# Patient Record
Sex: Female | Born: 1945
Health system: Southern US, Community
[De-identification: ages and names within clinical notes are randomized; demographics above are authoritative.]

## PROBLEM LIST (undated history)

## (undated) DIAGNOSIS — G2581 Restless legs syndrome: Secondary | ICD-10-CM

## (undated) DIAGNOSIS — C50919 Malignant neoplasm of unspecified site of unspecified female breast: Secondary | ICD-10-CM

## (undated) DIAGNOSIS — I4891 Unspecified atrial fibrillation: Secondary | ICD-10-CM

## (undated) DIAGNOSIS — G4730 Sleep apnea, unspecified: Secondary | ICD-10-CM

## (undated) DIAGNOSIS — M5440 Lumbago with sciatica, unspecified side: Secondary | ICD-10-CM

## (undated) DIAGNOSIS — I499 Cardiac arrhythmia, unspecified: Secondary | ICD-10-CM

## (undated) DIAGNOSIS — E113593 Type 2 diabetes mellitus with proliferative diabetic retinopathy without macular edema, bilateral: Secondary | ICD-10-CM

## (undated) DIAGNOSIS — K219 Gastro-esophageal reflux disease without esophagitis: Secondary | ICD-10-CM

## (undated) DIAGNOSIS — G510 Bell's palsy: Secondary | ICD-10-CM

## (undated) DIAGNOSIS — M1990 Unspecified osteoarthritis, unspecified site: Secondary | ICD-10-CM

## (undated) DIAGNOSIS — S82842A Displaced bimalleolar fracture of left lower leg, initial encounter for closed fracture: Secondary | ICD-10-CM

## (undated) DIAGNOSIS — E119 Type 2 diabetes mellitus without complications: Secondary | ICD-10-CM

## (undated) HISTORY — PX: VAGINAL HYSTERECTOMY: SUR661

## (undated) HISTORY — DX: Restless legs syndrome: G25.81

## (undated) HISTORY — DX: Type 2 diabetes mellitus without complications: E11.9

## (undated) HISTORY — DX: Malignant neoplasm of unspecified site of unspecified female breast: C50.919

## (undated) HISTORY — DX: Type 2 diabetes mellitus with proliferative diabetic retinopathy without macular edema, bilateral: E11.3593

## (undated) HISTORY — DX: Unspecified osteoarthritis, unspecified site: M19.90

## (undated) HISTORY — PX: OTHER SURGICAL HISTORY: SHX169

## (undated) HISTORY — DX: Lumbago with sciatica, unspecified side: M54.40

## (undated) HISTORY — PX: JOINT REPLACEMENT: SHX530

## (undated) HISTORY — DX: Bell's palsy: G51.0

---

## 2004-12-10 HISTORY — PX: OTHER SURGICAL HISTORY: SHX169

## 2005-10-25 ENCOUNTER — Inpatient Hospital Stay (HOSPITAL_COMMUNITY): Admission: RE | Admit: 2005-10-25 | Discharge: 2005-10-30 | Payer: Self-pay | Admitting: Orthopedic Surgery

## 2005-10-25 ENCOUNTER — Ambulatory Visit: Payer: Self-pay | Admitting: Physical Medicine & Rehabilitation

## 2007-01-02 ENCOUNTER — Inpatient Hospital Stay (HOSPITAL_COMMUNITY): Admission: RE | Admit: 2007-01-02 | Discharge: 2007-01-05 | Payer: Self-pay | Admitting: Orthopedic Surgery

## 2007-07-15 ENCOUNTER — Inpatient Hospital Stay (HOSPITAL_COMMUNITY): Admission: RE | Admit: 2007-07-15 | Discharge: 2007-07-17 | Payer: Self-pay | Admitting: Orthopedic Surgery

## 2007-10-21 ENCOUNTER — Inpatient Hospital Stay (HOSPITAL_COMMUNITY): Admission: RE | Admit: 2007-10-21 | Discharge: 2007-10-24 | Payer: Self-pay | Admitting: Orthopedic Surgery

## 2007-12-11 HISTORY — PX: TOTAL HIP ARTHROPLASTY: SHX124

## 2009-12-10 HISTORY — PX: OTHER SURGICAL HISTORY: SHX169

## 2010-12-10 DIAGNOSIS — G2581 Restless legs syndrome: Secondary | ICD-10-CM

## 2010-12-10 HISTORY — DX: Restless legs syndrome: G25.81

## 2011-04-24 NOTE — H&P (Signed)
Heather [name], Heather [name]               ACCOUNT NO.:  [id number]   MEDICAL RECORD NO.:  [id number]        PATIENT TYPE:  LINP   LOCATION:                               FACILITY:  [facility]   PHYSICIAN:  Pietro Cassis. Alvan Dame, M.D.  DATE OF BIRTH:  12-Jun-1946   DATE OF ADMISSION:  10/21/2007  DATE OF DISCHARGE:                              HISTORY & PHYSICAL   PROCEDURE:  Will be conversion to left total hip arthroplasty.   CHIEF COMPLAINT:  Left hip pain.   HISTORY OF PRESENT ILLNESS:  A 65 year old female with a history of left  hip pain with a history of left hip fracture with percutaneous screw  fixation with nonunion and subsequent pain.  It has been refractory to  all conservative treatment and therapy.  She has been presurgically  assessed by Dr. __________  .   PAST MEDICAL HISTORY:  Significant for:   1. Left femoral neck fracture with percutaneous screw fixation.  2. Osteoarthritis.  3. Diabetes.  4. Hyperlipidemia.  5. Overactive bladder.  6. Rhinitis.   PAST SURGICAL HISTORY:  Significant for percutaneous screw fixation left  hip to left knee surgery in January 2008 and 1953, right knee surgery  2006, cholecystectomy 1990, hysterectomy 1984.   FAMILY HISTORY:  Diabetes, cancer.  ALS multiple family members.   SOCIAL HISTORY:  The patient is married.  Primary caregiver after  surgery will be husband.   DRUG ALLERGIES:  SULFA, COUMADIN which causes significant blood thinning  with usage.   MEDICATIONS:  1. Actos plus metformin 15/500 two q.a.m., one q.p.m.  2. Vytorin 10/41 p.o. daily.  3. Zoloft 150 mg one p.o. daily.  4. Detrol LA 4 mg one p.o. daily.  5. Requip 2 mg one p.o. daily.  6. Neurontin 600 mg one p.o. daily.  7. Calcium plus vitamin D.  8. Multivitamin.  9. Aspirin 81 mg.   REVIEW OF SYSTEMS:  NEUROLOGY:  Has a history of some restless leg  syndrome.  GENITOURINARY:  Recent UTI treated.  Otherwise, see HPI.   PHYSICAL EXAMINATION:  VITAL SIGNS:  Pulse  72, respirations 18, blood  pressure 128/60.  GENERAL:  Awake, alert, and oriented, well-developed, well-nourished in  no acute distress.  NECK:  Supple.  No carotid bruits.  CHEST/LUNGS:  Clear to auscultation bilaterally.  BREASTS:  Deferred.  HEART:  Regular rate and rhythm without gallops, clicks, rubs or  murmurs.  ABDOMEN:  Soft, nontender, nondistended.  Bowel sounds present.  GENITOURINARY:  Deferred.  EXTREMITIES:  Increased pain with flexion, internal rotation of her left  hip.  SKIN:  Previous lateral scar for percutaneous screw fixation.  No  cellulitis.  NEUROLOGIC:  Intact distal sensibilities.   LABORATORY DATA:  Labs and chest x-ray are pending.   EKG was performed at primary care showing normal sinus rhythm.   IMPRESSION:  1. Nonunion left femoral neck fracture with percutaneous screw.  2. Osteoarthritis.  3. Diabetes.  4. Hyperlipidemia.  5. Overactive bladder.  6. Rhinitis.   PLAN OF ACTION:  Conversion of left total hip arthroplasty with removal  of percutaneous screw.  Risks and complications were discussed.   Postoperative medications including Lovenox Robaxin, Colace, MiraLax,  iron, aspirin provided at time of History and Physical.  Postoperative  pain medicine will be provided at time of surgery.     ______________________________  Carlean Jews Collene Mares, [place]      Pietro Cassis. Alvan Dame, M.D.  Electronically Signed    BLM/MEDQ  D:  10/16/2007  T:  10/17/2007  Job:  [id number]

## 2011-04-24 NOTE — Op Note (Signed)
NAMEBERKELEY, CARDIEL               ACCOUNT NO.:  [id number]   MEDICAL RECORD NO.:  [id number]          PATIENT TYPE:  INP   LOCATION:  5013                         FACILITY:  [facility]   PHYSICIAN:  Pietro Cassis. Alvan Dame, M.D.  DATE OF BIRTH:  [birth year]   DATE OF PROCEDURE:  07/15/2007  DATE OF DISCHARGE:                               OPERATIVE REPORT   PREOPERATIVE DIAGNOSIS:  Nondisplaced left femoral neck fracture.   POSTOPERATIVE DIAGNOSIS:  Nondisplaced left femoral neck fracture.   PROCEDURE:  Closed reduction percutaneous cannulated screw fixation of a  left femoral neck fracture.   SURGEON:  Pietro Cassis. Alvan Dame, M.D.   ASSISTANT:  None.   ANESTHESIA:  General.   BLOOD LOSS:  Minimal.   COMPLICATIONS:  None.   INDICATIONS FOR PROCEDURE:  Ms. Coffey is a pleasant 65 year old female  who is known to our practice to Dr. Justice Britain.  She had a history of  bilateral total knee replacements through him.  She had a fall a couple  weeks ago where she was trying to prevent falling onto her knees  directly when she landed on her left hip.  She did have pain but was  initially seen in the local emergency room where radiographs were non-  conclusive for femoral neck fracture.  She had persistent discomfort in  the hip but also a lot of pain down into her knee area.  She was  subsequently seen in Dr. Susie Cassette office for evaluation of knee pain.  Radiographs in our office indicated the femoral neck fracture was  nondisplaced that she had been walking on for 2 weeks.   I was asked to manage this left hip fracture.  I discussed with her the  risks and benefits including a 30% risk of nonunion or failed union  requiring revision to a total hip replacement with this technique.  At  65 years of age I think this offers a good opportunity for joint  preservation.   PROCEDURE IN DETAIL:  The patient was brought into the operative  theater.  Once adequate anesthesia and preoperative antibiotics, 2  grams  of Ancef, administered the patient was positioned supine on the fracture  table.  The right leg was flexed and abducted out of the way with bony  prominences padded particularly on the lateral side.  The left hip was  then placed into the traction shoe.   With gentle traction internal rotation and some flexion of the hip joint  under fluoroscopic imaging the fracture was maintained in this reduction  in both AP and lateral planes.  At this point the lateral side of her  thigh was prepped and draped with shower curtain technique.  At this  point fluoroscopy was brought into the field.  Landmarks were identified  using the pin.   The lateral incision was made.  The initial pin was first inserted into  the inferior portion of the neck into the center portion of the head on  the lateral view.  Two other subsequent guidewires were then passed  anterior and posterior proximal to this.  I then measured  the depths of  the screws, drilled the outer cortex and then passed 7.5 mm cannulated  screws with 16 mm partial threads.  This was done in order from inferior  neck to posterior to anterior.  They were tightened down in order.  There was no displacement ore movement of the fracture site.  Following  this final radiographs were obtained in AP and lateral planes.  The  wound was irrigated.  The wound was reapproximated with 2-0 Vicryl and a  running 4-0 Monocryl.  The wound was then cleaned, dried and dressed  with Steri-Strips and a sterile dressing.  The patient was then brought  to recovery room, extubated in stable condition.      Pietro Cassis Alvan Dame, M.D.  Electronically Signed     MDO/MEDQ  D:  07/15/2007  T:  07/16/2007  Job:  [id number]

## 2011-04-24 NOTE — Op Note (Signed)
Heather [name], COFFEE               ACCOUNT NO.:  [id number]   MEDICAL RECORD NO.:  [id number]          PATIENT TYPE:  INP   LOCATION:  [place]                         FACILITY:  [facility]   PHYSICIAN:  Pietro Cassis. Alvan Dame, M.D.  DATE OF BIRTH:  Feb 04, 1946   DATE OF PROCEDURE:  10/21/2007  DATE OF DISCHARGE:                               OPERATIVE REPORT   PREOPERATIVE DIAGNOSIS:  Failed left hip surgery from previous femoral  neck fracture repair with malunion.   POSTOPERATIVE DIAGNOSIS:  Failed left hip surgery from previous femoral  neck fracture repair with malunion.   PROCEDURE:  Conversion of previous left hip surgery to a left total hip  replacement utilizing DePuy hip system, size 50 pinnacle cup, a 36 metal  neutral liner, size 4 standard Trilock stem with a 36, +5 Delta ceramic  ball.   SURGEON:  Pietro Cassis. Alvan Dame, M.D.   ASSISTANT:  Carlean Jews. Mann, P.A.-C.   ANESTHESIA:  General.   BLOOD LOSS:  500 mL.   DRAINS:  One.   COMPLICATIONS:  None.   INDICATIONS FOR PROCEDURE:  Ms. Ragonese is a 65 year old female patient  of mine following a femoral neck fracture.  This was treated initially  based on the nondisplaced nature of it with percutaneous screw fixation.  She had never had pain relief despite it now three months out postop,  her pain was persistent and growing with activity.  She was unable to  get away from an assist device which would be atypical in the healing of  a fracture of this nature.  I discussed with her the treatment options  and based on the prolonged nature of how things were going as well as co-  existing knee discomfort above a primary total knee replacement, she  wished to proceed with surgical intervention converting this to a total  hip replacement.  We reviewed the risks and benefits of these procedures  including infection, DVT, component failure, need for revision,  dislocation as well as the postoperative course, expectations, hospital  stay.   Consent was obtained for an attempt of better relief of her left  hip pain.   PROCEDURE IN DETAIL:  The patient was brought to the operative theater.  Once adequate anesthesia and preoperative antibiotics, 2 grams of Ancef,  were administered, the patient was positioned in the right lateral  decubitus position with the bony prominence padded.  The left lower  extremity was then pre-scrubbed and prepped and draped in a sterile  fashion.  The previous incision was identified which was anterior, thus  a new incision was carried out.  I extended it distally and up over the  lateralis to expose the vastus lateralis fascia.  Initial exposure was  taken down to the iliotibial band, the vastus fascia, as well as the  gluteal fascia.  I exposed distally initially and was able to palpate  the screws and locate them and remove them without complication.  Following removal of the hardware, I did reapproximate this iliotibial  band and vastus fascia with a #1 Vicryl.   Following removal of the hardware, I  tended to the femur.  I extended my  incision posterior for a posterior approach.  The short external  rotators were elevated off of the posterior capsule and removed  separately. The posterior capsule was then opened with a posterior  leaflet preserved for later attempt at repair, but please note I was  unable to do a debridement of the superior leaflet with reaming.  Nonetheless, the posterior leaflet was saved for protection of the  sciatic nerve from retractors.   When the hip dislocated, I noted at this point significant shortening of  the femoral head and there was basically no neck segment to cut and so I  made an initial saw cut into the trochanteric fossa which I was able to  palpate which was basically into the inferior portion of the head.  Following removal of the head, I was able to debride portions of the  neck.  There was no evidence of any infection or nonunion material.  I   placed retractors to prepare the femur and used a starting reamer  followed by hand reamer for canal identification, irrigated the canal to  prevent fat emboli.  I then began broaching up using the tip of the  trochanter as my primary landmark.  I initially broached up to a size 3  which sat beneath my neck cut and I used the calcar mill at this point  to set the femoral neck angle. I then broached up to a size 4.  I then  packed the femur and attended to the acetabulum.  Acetabular retractors  were placed, labrectomy carried down following exposure. I did begin  reaming with a 43 reamer and reamed up to a 49 reamer with excellent  bony bed preparation and preserved anatomy.  I impacted a 50 mm cup at  this point at about 35-40 degrees of abduction, 10 degrees of forward  flexion beneath the anterior rim.  A single cancellous screw was placed.  We had an excellent purchase with an excellent initial scratch fit.  The  trial liner was placed at 36 neutral.   Attention was redirected back to the femur. Initially, I did broach back  up to a size 5 around the level of my neck cut.  The femoral head  appeared to sit a bit proud of the tip of the trochanter and  accordingly, I was unable to reduce the hip.  I removed the size 5  broach and broached with a 4 and sat it down within the neck cut  approximately 5 mm, thus requiring the use of a calcar mill.  I milled  the bone back down to the level of the size 4 broach and did a trial  reduction with a standard neck and a 36, +1 ball.  The hip reduced, the  combined anteversion was probably about 50 degrees, the hip was very  stable.  The leg lengths, compared to the down leg, at least was not  over lengthened but may be a little bit short.  At this point, I  dislocated and re-trialed the 36, +5 ball.  At this point, the hip  stability remained good, this only added about 1.5 mm in length and also  1.5 mm offset which I wanted.  With this, I  removed all trial  components, placed the central hole eliminator in the acetabulum and  placed a metal 36 by 50 neutral liner without difficulty or  complication. The final size 4 four standard stem was impacted  at the  level of the new neck cut following broaching and a 36, +5 ceramic ball  impacted onto the trunnion.  The hip was reduced.  Throughout the case,  the hip was irrigated.  She was noted to have significant adipose tissue  with some initial oozing and bleeding but at no time during the case  there was no significant hemostasis that was required.  I did use a  Hemovac drain intra-articular.  As previously noted, the superior  capsular leaflet was not able to be reapproximated.  We reapproximated  the remaining portion of the iliotibial band with #1 Ethibond and ran a  #1 Vicryl in the gluteal fascia.  The remainder of the wound was closed  2-0 Vicryl and running 4-0 Monocryl.  The patient was then transferred  to the hospital bed in stable condition.   I am going to put her in a knee immobilizer to help support the left  knee, have her ice her left knee, as well, as I have a feeling it will  be sore after the surgery.  This was something that she and I reviewed  and may need to address later on.      Pietro Cassis Alvan Dame, M.D.  Electronically Signed     MDO/MEDQ  D:  10/21/2007  T:  10/21/2007  Job:  [id number]

## 2011-04-24 NOTE — H&P (Signed)
NAMESYDNEY, MINOT               ACCOUNT NO.:  [id number]   MEDICAL RECORD NO.:  [id number]           PATIENT TYPE:   LOCATION:                                 FACILITY:   PHYSICIAN:  Tracy A. Shuford, P.A.-C.DATE OF BIRTH:  1946/07/30   DATE OF ADMISSION:  07/15/2007  DATE OF DISCHARGE:                              HISTORY & PHYSICAL   CHIEF COMPLAINT:  Left hip pain.   HISTORY:  Ms. Mester is a 65 year old female who sustained a fall 2  weeks ago.  She stumbled trying to put on shoes in a shoe store and in  order to try to save her knee that she had replaced 6 months, she  twisted awkwardly and fell directly onto her left hip.  She has had  significant hip and groin pain since that time.  Pain with attempt at  weight bearing.  She was initially seen at [facility] where x-  rays were reportedly negative.  She ambulated in to our office today on  a walker and has had continued intractable hip pain.  X-rays in our  office showed a nondisplaced impacted femoral neck fracture.  With these  findings, she is now being admitted tomorrow for planned left hip  pinning by Dr. Alvan Dame.  Risks and benefits discussed with patient.  She is  in agreement and wishes to proceed.   PAST MEDICAL HISTORY:  Significant for:  1. Non-insulin-dependent diabetes.  2. Restless leg syndrome.  3. Hypercholesterolemia.   PAST SURGICAL HISTORY:  1. Previous knee reconstructions.  2. More recent bilateral total knee arthroplasties.   SOCIAL HISTORY:  She does not drink or smoke.   MEDICATIONS:  Requip, Actos, metformin, Vytorin, Zoloft, Neurontin and  aspirin.   DRUG ALLERGIES:  SHE DID NOT TOLERATE DILAUDID IN INTRAVENOUS FORM  PREVIOUSLY, BUT DOES OKAY WITH MORPHINE.   REVIEW OF SYSTEMS:  She denies recent fevers, chills or night sweats.  No bleeding tendencies.  CNS:  No blurred, double vision, seizure or  paralysis.  RESPIRATORY:  No shortness of breath, productive cough or  hemoptysis.   GI:  No nausea, vomiting, constipation, melena or bloody  stools.  GENITOURINARY:  No dysuria or hematuria.  She does have a  history of bladder spasm and has been placed on no medication for this.  MUSCULOSKELETAL:  As per history of present illness.   PHYSICAL EXAMINATION:  GENERAL:  Ms. Rubi is an alert and oriented 78-  year-old female in obvious discomfort in regards to her left hip.  CHEST:  Clear.  HEART:  Regular rate and rhythm.  ABDOMEN:  Positive bowel sounds.  EXTREMITIES:  Shows the left hip with painful, guarded range of motion.  Pain with log roll.  Neurovascularly she is grossly intact.   X-RAYS:  Film of the left hip show an impacted left femoral neck  fracture without significant displacement.   IMPRESSION:  Impacted left femoral neck fracture.   PLAN:  She will be admitted for planned hip pinning.  Based on the x-  rays, Dr. Alvan Dame will review.  If he should decide she would  be more of a  candidate for hemiarthroplasty, we will proceed based on his  recommendations.  We will plan on admission tomorrow for planned hip  surgery.      Tracy A. Shuford, P.A.-C.     TAS/MEDQ  D:  07/14/2007  T:  07/14/2007  Job:  [id number]

## 2011-04-27 NOTE — Op Note (Signed)
NAMEPHEDRA, ZELENKA               ACCOUNT NO.:  [id number]   MEDICAL RECORD NO.:  [id number]          PATIENT TYPE:  INP   LOCATION:  0012                         [facility]ACILITY[facility]   PHYSICIAN:  Metta Clines. Supple, M.D.  DATE OF BIRTH:  Sep 19, 1946   DATE OF PROCEDURE:  10/25/2005  DATE OF DISCHARGE:                                 OPERATIVE REPORT   PREOPERATIVE DIAGNOSIS:  End-stage right knee osteoarthrosis.   POSTOPERATIVE DIAGNOSIS:  End-stage right knee osteoarthrosis.   PROCEDURE:  Right total knee arthroplasty utilizing a cemented DePuy Sigma  implant system with size 3 femur, size 3 tibia, a 10 mm thick rotating  platform polyethylene insert.   SURGEON:  Metta Clines. Supple, M.D.   Terrence DupontOlivia Mackie A. Shuford, P.A.-C.   ANESTHESIA:  General endotracheal.   TOURNIQUET TIME:  One hour and 10 minutes.   ESTIMATED BLOOD LOSS:  200 cc.   DRAINS:  Hemovac x1.   HISTORY:  Ms. Ribar is a 65 year old female who has had chronic and  progressively increasing bilateral knee pain with clinical examination  showing moderate restrictions in mobility and plain radiographs confirming  advanced arthrosis with particular involvement of the patellofemoral joints.  At the time of surgical scheduling, the right knee was most symptomatic, and  she requested to proceed with the right knee.  Patient was brought to the  operating room at this time for planned right total knee arthroplasty.   I preoperatively counseled Ms. Lisanti on treatment options as well as risks  versus benefits thereof, possible surgical complications of bleeding,  infection, neurovascular injury, DVT, PE, persistent pain, loss of motion,  anesthetic complication, and possible need for additional surgery were  reviewed.  She understands and accepts and agrees with our planned  procedure.   PROCEDURE IN DETAIL:  After undergoing routine preoperative evaluation, the  patient received prophylactic antibiotics, placed supine  on the operating  room table, and underwent smooth induction of a general endotracheal  anesthesia.  A Foley catheter was placed.  Patient received antibiotics.  Tourniquet applied to the right thigh, and the right leg was sterilely  prepped and draped in the standard fashion.  The leg was exsanguinated with  the tourniquet inflated initially to 350 mmHg and then increased to 400 mmHg  10 minutes into the case.  An anterior midline incision was then made from  four fingerbreadths above the patella to just medial to the tibial tubercle.  Total length was approximately 20 cm.  Skin flaps were elevated medially and  laterally, and the medial parapatellar arthrotomy was performed with  electrocautery.  Hemostasis was obtained with electrocautery.  The patella  was everted and noted to have very large peripheral osteophytes with a very  large loose fragment in the quadriceps tendon distally.  These were all  carefully excised.  This allowed complete eversion of the patella without  difficulty.  The knee was flexed up, and then the remnants of the menisci as  well as the cruciate ligaments were then excised.  The dura was used to gain  access to the femoral and medullary  canal followed by an intramedullary  guide.  An 11 cut was made from the distal femur at a 5 degree valgus  alignment.  The distal femur was then sized, and the size 3 had the proper  fit.  The size 3 cutting guide was then applied.  We made the anterior,  posterior, and chamfer cuts on the distal femur.  Attention was then turned  to the proximal tibia, which was reflected with McHale retractors.  Using  the extramedullary guide, removed 10 mm of bone, measured from the lateral  plateau.  The proximal tibia was then measured as a size 3.  The size 3  trial was then pinned into place.  We performed a trial reduction with the  femoral tibial implants, and this showed good soft tissue balance and  alignment.  The tibia was then  terminally repaired with the intramedullary  reamer followed by a broach.  Attention was then returned to the distal  femur, where we used the box cutting guide to cut the box along the distal  femur.  We then replaced the trial implants and again confirmed good soft  tissue balance, excellent stability, and good motion.  Attention was then  turned to the patella, where we used an off-loading saw to removed  approximately 9 mm of bone.  Stabilized drill pegs were then placed, and we  confirmed that the 38 mm button had a proper fit.  At this point, all  implants were removed.  The joint was pulsatile lavaged and meticulously  cleaned.  We palpated the posterior compartments and found no evidence for  retaining bone spurs, loose bodies, or debris.  Cement was then mixed at the  appropriate consistency.  The implants were then cemented into position,  beginning with the femur, the tibia, and the patella.  Meticulously removed  all extra cement.  Trial reduction was then completed and showed to be soft  tissue balance.  We used a 10 mm thick rotating platform, posterior  stabilized tibial tray.  Range of motion showed good stability.  At this  point, the tourniquet was then let down.  Hemostasis was obtained.  A  Hemovac drain was then brought out laterally.  The parapatellar arthrotomy  was closed with a series of figure-of-eight #1 Vicryl sutures, and 2-0  Vicryl was used for the subcu and intracuticular 3-0 Monocryl for the skin,  followed by Steri-Strips.  A dry dressing was applied.  The leg was placed  into a knee immobilizer.  The patient was then extubated and taken to the  recovery room in stable condition.      Metta Clines. Supple, M.D.  Electronically Signed     KMS/MEDQ  D:  10/25/2005  T:  10/25/2005  Job:  [id number]

## 2011-04-27 NOTE — Discharge Summary (Signed)
NAMEDENIZE, HANKE               ACCOUNT NO.:  [id number]   MEDICAL RECORD NO.:  [id number]          PATIENT TYPE:  INP   LOCATION:  5013                         FACILITY:  [facility]   PHYSICIAN:  Pietro Cassis. Alvan Dame, M.D.  DATE OF BIRTH:  Jul 19, 1946   DATE OF ADMISSION:  07/15/2007  DATE OF DISCHARGE:  07/17/2007                               DISCHARGE SUMMARY   ADMITTING DIAGNOSES:  1. Left femoral neck fracture.  2. Non-insulin-dependent diabetes.  3. Restless legs syndrome.  4. Hypercholesterolemia.  5. Bilateral total knee arthroplasties.   DISCHARGE DIAGNOSES:  1. Left femoral neck fracture status post open reduction, internal      fixation.  2. Non-insulin-dependent diabetes.  3. Restless legs syndrome.  4. Hypercholesterolemia.  5. Bilateral total knee arthroplasties.   CONSULTS:  None.   PROCEDURE:  Open reduction and percutaneous screw fixation, left hip.   SURGEON:  Pietro Cassis. Alvan Dame, M.D.   BRIEF HISTORY OF PRESENT ILLNESS:  A 65 year old female who fell about 2  weeks ago, was previously evaluated, seen in our office after pain had  not subsided.  X-rays revealed an impacted femoral neck fracture.  Admitted for plan of the left hip pinning by Dr. Alvan Dame.   LABORATORIES:  Admission CBC showed hematocrit to be 32.1 and 32.8  postoperatively.  Routine chemistry all within normal limits at  discharge.  Kidney function:  Her GFR was 5049 at the time of discharge.  Her calcium was 8.9.   EKG:  Normal sinus rhythm.   RADIOLOGY:  Intraoperative left hip showed pinning of the left femoral  neck.   HOSPITAL COURSE:  The patient was admitted to the hospital on the 5th  after impacted femoral neck fracture found on x-ray.  Surgery took place  on the 5th.  She did well.  During the course of her stay, she remained  afebrile.  She remained neurovascularly intact of her left lower  extremity.  She did have a UTI, which was treated with Cipro 500 mg  while in the hospital.  She  was partially weightbearing at 50%.  Hep-  Lock was done on day 1.  Progressed nicely about postoperative day #2,  was having some leg pain but consistent with surgery.  Temperature was  101.  Dessing was dry.  She was ready for discharge home.  She already  had Percocet and Robaxin at home.  Cipro was added for an additional 3  days for her UTI.   DISCHARGE DISPOSITION:  Discharge home in stable and improved condition.   DISCHARGE WOUND CARE:  Change dressing on a daily basis.   DISCHARGE ACTIVITY:  Partial weightbearing 25-50%, left lower extremity.   DISCHARGE FOLLOWUP:  Follow up with Dr. Aurea Graff office, ward 3900, in 10  days.   DISCHARGE MEDICATIONS:  Include:  1. Percocet.  2. Robaxin prn for spasm and pain.  3. Aspirin 325 mg p.o. daily for 4 weeks.  4. Requip 2 mg p.o. daily.  5. Actos.  6. Metformin.  7. Vytorin.  8. Zoloft.  9. Neurontin.  10.Detrol.  Continue.   DISCHARGE HOME HEALTH:  [facility] with physical therapy.   DISCHARGE PHYSICAL THERAPY:  Goals of physical therapy:  To work on gait  training, proprioception, minimize pain, maximize strength, increase  range of motion, encourage independence in activities of daily living.     ______________________________  Carlean Jews. Collene Mares, [place]      Pietro Cassis. Alvan Dame, M.D.  Electronically Signed    BLM/MEDQ  D:  08/20/2007  T:  08/20/2007  Job:  [id number]

## 2011-04-27 NOTE — Discharge Summary (Signed)
Heather [name], Heather [name]               ACCOUNT NO.:  [id number]   MEDICAL RECORD NO.:  [id number]          PATIENT TYPE:  INP   LOCATION:  [place]                         FACILITY:  [facility]   PHYSICIAN:  Pietro Cassis. Alvan Dame, M.D.  DATE OF BIRTH:  [birth year]   DATE OF ADMISSION:  10/21/2007  DATE OF DISCHARGE:  10/24/2007                               DISCHARGE SUMMARY   ADMITTING DIAGNOSES:  1. Nonunion left hip fracture.  2. Osteoarthritis.  3. Diabetes.  4. Hyperlipidemia.  5. Over-active bladder.  6. Rhinitis.   DISCHARGE DIAGNOSES:  1. Nonunion left neck fracture.  2. Osteoarthritis.  3. Diabetes.  4. Hyperlipidemia.  5. Over-active bladder.  6. Rhinitis.  7. Acute blood loss anemia with transfusion.   HISTORY OF PRESENT ILLNESS:  A 65 year old female with a history of left  hip pain with a history of left hip fracture with percutaneous screw  fixation with nonunion and subsequent pain.  Refractory to all  conservative treatment therapy.  Presurgically assessed by Dr. Greig Right who is at [place].   CONSULTS:  None.   PROCEDURE:  Conversion left total hip replacement.  Surgeon Dr. Paralee Cancel.  Assistant Rowan Blase, PA-C.  Components were metal on ceramic.   LABS:  Preadmission CBC:  Hematocrit 31.5, postoperatively on day 2, it  dropped down to 21.3.  Given 2 units and came back up to 25.2 and was  stable.  Her platelets were 139,000 at discharge as well.  Routine  chemistry on admission:  Creatinine was 1.22, glucose 150, all others  normal, tracked throughout her course of stay.  At discharge, her  glucose was 102, her creatinine was back down to 1.02.  GFR measurement  was 45 preadmission, 55 at time of discharge, with her calcium 7.9.  UA  showed small bilirubin and a trace of ketones, otherwise negative.  She  was transfused 2 units of B-positive packed red blood cells.   RADIOLOGY:  Portable pelvis:  No complication status post left total hip  place.   No chest x-ray seen in chart.  No EKG found in chart either.   HOSPITAL COURSE:  The patient underwent conversion left total hip  replacement.  Tolerated procedure well.  Was admitted to the orthopedic  floor.  She did have a mild fever on postop day #1 but it resolved by  postop day #2.  She did have a drop in her hematocrit down to 25.3  ultimately requiring 2 units of packed red blood cells.  Dressing was  changed on a daily basis.  After postoperative day #1, no significant  drainage.  She was neurovascularly intact of her left lower extremity  throughout her course of stay.  She was PT/OT weightbearing as tolerated  throughout.  Did well with her progress.  After the 2 units of blood,  she felt much better and much stronger.  She was able to ambulate at  least 50 feet with the use of rolling walker and, after day three and  physical therapy, she was ready for discharge home.   DISCHARGE DISPOSITION:  Discharge home with home health care PT, stable  and improved condition.   DISCHARGE DIET:  Regular.   DISCHARGE WOUND CARE:  Keep dry.   DISCHARGE PHYSICAL THERAPY:  She is weightbearing as tolerated with the  use of her own walker.  Goals of physical therapy to be to minimize  pain, maximize strength, increase range of motion, gait retraining.   DISCHARGE MEDICATIONS INCLUDE:  1. Lovenox 40 mg subcu q. 24 times 11 days.  2. Robaxin 500 mg p.o. q.6.  3. Colace 100 mg p.o. b.i.d.  4. MiraLax 17 gm p.o. daily.  5. Vicodin 5/325 one to two p.o. q.4-6 p.r.n. pain.  6. Gabapentin 300 mg two caps at bedtime.  7. ACTOplus 15/500 two tablets q.a.m.  8. ACTOplus 15/500 one p.o. q.p.m.  9. Prilosec 40 mg p.o. q.a.m.  10.Cipro 250 mg p.o. b.i.d. stop.  11.Detrol LA 4 mg p.o. nightly.  12.Sertraline 100 mg p.o. q.a.m.  13.Requip 2 mg p.o. q.p.m.  14.Vytorin 10/40 p.o. q.a.m.  15.Ambien 10 mg p.o. nightly p.r.n.   DISCHARGE FOLLOWUP:  Follow up with Dr. Alvan Dame in 10-14 days for wound   check at phone number [phone].     ______________________________  Carlean Jews. Collene Mares, [place]      Pietro Cassis. Alvan Dame, M.D.  Electronically Signed    BLM/MEDQ  D:  11/18/2007  T:  11/18/2007  Job:  [id number]

## 2011-04-27 NOTE — Op Note (Signed)
NAMEDAYRIN, SCOFIELD               ACCOUNT NO.:  [id number]   MEDICAL RECORD NO.:  [id number]          PATIENT TYPE:  INP   LOCATION:  2899                         FACILITY:  [facility]   PHYSICIAN:  Metta Clines. Supple, M.D.  DATE OF BIRTH:  August 20, 1946   DATE OF PROCEDURE:  01/02/2007  DATE OF DISCHARGE:                               OPERATIVE REPORT   PREOPERATIVE DIAGNOSIS:  End-stage left knee osteoarthrosis.   POSTOPERATIVE DIAGNOSIS:  End-stage left knee osteoarthrosis.   PROCEDURE:  A cemented left total knee arthroplasty utilizing a DePuy  Sigma PS System size 90femur, size 4 tibial tray, a 12.5 mm thick  rotating platform polyethylene insert and 35 mm patellar button.   SURGEON OF RECORD:  Metta Clines. Supple, M.D.   Terrence DupontOlivia Mackie A. Shuford, P.A.-C.   ANESTHESIA:  LMA general as well as a femoral nerve block.   TOURNIQUET TIME:  1 hour 6 minutes.   ESTIMATED BLOOD LOSS:  Minimal.   DRAINS:  Hemovac x1.   HISTORY:  Ms. Narro is a 65 year old female who has had chronic  bilateral knee pain and is status post a right total knee arthroplasty  which we performed last year and is now brought to the operating room  for planned left total knee arthroplasty.   We preoperatively counseled Ms. Hawthorne on treatment options as well as  risks versus benefits thereof.  Possible complications including  infection, neurovascular injury, DVT, PE, persistent pain, loss motion  and possible need for revision surgery were all reviewed.  She  understands and accepts and agrees to planned procedure.   PROCEDURE IN DETAIL:  After undergoing routine preop evaluation, the  patient received prophylactic antibiotics.  The patient is brought to  the operating room and placed supine on the operating table, underwent  smooth induction of a LMA general anesthesia.  A femoral nerve block was  established and then a Foley catheter was placed.  Tourniquet applied to  the left thigh, the left leg was  sterilely prepped and draped in  standard fashion.  Leg was exsanguinated with the tourniquet inflated to  350 mmHg.  We utilized the previous curvilinear anteromedial incision,  incorporated this into the knee incision, total length approximately 20  cm.  Skin flaps were mobilized and sutured back.  A medial parapatellar  arthrotomy was then performed with electrocautery.  Hemostasis was  obtained.  Patella was everted and there were noted be advanced  patellofemoral arthrosis with very large rimming osteophytes on the  patella.  Knee was flexed up and the remnants of the menisci removed as  were the cruciate ligaments.  The patellar fat pad was resected  approximately 50-75%.  A drill was then used to gain access to the  femoral medullary canal.  An intramedullary guide was then passed.  She  had an approximately 25 degree flexion contracture so we resected 12 mm  off the distal femur with a 5 degree valgus cut.  The distal femur was  then sized and size 3 had the best fit.  The distal femoral cutting  guide was applied.  We  made the anterior-posterior and chamfer cuts on  the distal femur.  The femoral implant had good coverage and proper fit  of the implant.  Attention then turned to the proximal tibia which was  exposed and then using an extramedullary guide, we did a resection  measuring 10 mm from the lateral plateau.  Care was taken to protect the  surrounding soft tissues.  Tibia was resected.  We then performed  assessment of the tibial size and the size 4 tray had the best coverage.  This was pinned in position temporarily and then we performed a  reduction with the implants and this showed excellent soft tissue  balance with good mobility.  We then performed final preparation of the  proximal tibia using the reamer and broach.  We then returned our  attention to the distal femur where we used the box cutting guide to  resect the femoral box cut.  The final of boxed implant was  placed into  position.  Again reduction was performed and implant showed good fit and  good stability.  Attention then turned to the patella.  The peripheral  osteophytes were noted.  The bone was measured and we used an  oscillating saw to resect approximately 8 mm of bone from the patellar  surface.  The patella was then measured and 35 mm button had best  coverage.  Stabilizing drill holes were then made.  At this point,  pulsatile lavage was then performed to meticulously clean the entire  joint.  We used a curved osteotome to remove osteophytes on the  posterior aspect of the femoral condyles.  The joint was then  meticulously cleaned and all residual debris was removed from posterior  aspect of the joint.  Cement was mixed at the back table at the  appropriate consistency the implants were then cemented into position  beginning with the tibia and femur, then the patella.  Meticulous  removal of residual cement was completed.  At this point trial  reductions were again performed and the 12.5 mm polyethylene tray showed  excellent soft tissue balance full extension and good stability.  The  knee was then flexed up and the final 12.5 mm polyethylene insert was  inserted.  The reduction was performed.  The knee showed excellent  mobility and good stability and full extension.  A tourniquet was let  down.  A Hemovac drain was brought out superolaterally.  Hemostasis was  obtained.  The parapatellar arthrotomy was closed with series of running  #1 figure-of-eight Vicryl sutures.  2-0 Vicryl used for the deep and  superficial subcu and intracuticular 3-0 Monocryl for the skin followed  by Steri-Strips.  Dry dressing was then applied.  Placed in a knee  immobilizer with ice-packs.  The patient was then extubated, taken to  recovery room in stable condition.      Metta Clines. Supple, M.D.  Electronically Signed    KMS/MEDQ  D:  01/02/2007  T:  01/02/2007  Job:  [id number]

## 2011-04-27 NOTE — Discharge Summary (Signed)
NAMEMRYTLE, LYKE               ACCOUNT NO.:  [id number]   MEDICAL RECORD NO.:  [id number]          PATIENT TYPE:  INP   LOCATION:  5035                         FACILITY:  [facility]   PHYSICIAN:  Metta Clines. Supple, M.D.  DATE OF BIRTH:  June 09, 1946   DATE OF ADMISSION:  01/02/2007  DATE OF DISCHARGE:  01/05/2007                               DISCHARGE SUMMARY   ADMISSION DIAGNOSES:  1. End-stage osteoarthrosis of left knee.  2. Restless leg syndrome.  3. Non-insulin dependent diabetes.  4. Generalized osteoarthrosis.   DISCHARGE DIAGNOSES:  1. End-stage osteoarthrosis of left knee.  2. Restless leg syndrome.  3. Non-insulin dependent diabetes.  4. Generalized osteoarthrosis.  5. Status post left total knee arthroplasty.   OPERATIONS:  Left total knee arthroplasty under a general anesthetic.   SURGEON:  Dr. Onnie Graham.   ASSISTANT:  Reather Laurence. Shuford, P.A.-C.   BRIEF HISTORY:  Ms. Chubb is well-known to [place].  She is a 65 year old  female who has had end-stage osteoarthrosis involving the bilateral  knees.  She has done well with her previous right total knee  arthroplasty.  She is now failed out patient conservative measures in  regards to her left knee osteoarthrosis.  A total knee arthroplasty was  indicated.  Risks and benefits discussed with the patient at length and  she was in agreement and wished to proceed.   HOSPITAL COURSE:  The patient was admitted and underwent the above  mentioned procedure and tolerated this well.  All appropriate IV  antibiotics and analgesics were utilized.  Postoperatively she was  placed on Coumadin for DVT and PE prophylaxis, as well as PAE stocking.  She began to progress with normal physical therapy.  She remained  medically and hemodynamically stable.  She had mild elevated temps,  pulmonary toilet was encouraged.  By postoperative day 3, she had met  all therapy discharged goals and was insistent upon being discharged to  home.  At this  time, she had a T-max of 100.5, hemoglobin was 10, and  she was near therapeutic margins on her INR at 1.8.  Her knee was clean  and dry.  She did have a blister from the tourniquet which was stable.  At this time she was felt to be medically and orthopedically stable and  was discharged to home.   LABORATORY DATA:  Shows admission hemoglobin of 12.2, postoperatively  10.9, 9.8, and 10.  Pro-time INR is not in the chart.  Monitored by the  pharmacy on her Coumadin.  Chemistries on admission showed mildly  elevated glucose, otherwise within normal limits.  EKG and chest x-ray  not found at time of dictation, from prior entering in her old chart.   CONDITION ON DISCHARGE:  Stable and good.   DISCHARGE MEDICATIONS:  In plan.  The patient is being discharged to  home.  She is on the following medications:  Coumadin per pharmacy.  Prescriptions fro Xanax, Percocet, Robaxin, and Ambien are left for her.  Resume other home medications.  Resume home diet.  May shower on day 5.  GMT will be seeing her  for home health PT OT and R.N.  Follow up in our  office in two weeks.  Call [phone] for a time.  Condition on discharge  is stable and improved.      Tracy A. Shuford, P.A.-C.      Metta Clines. Supple, M.D.  Electronically Signed    TAS/MEDQ  D:  03/13/2007  T:  03/13/2007  Job:  [id number]

## 2011-04-27 NOTE — Discharge Summary (Signed)
Heather [name], Heather [name]               ACCOUNT NO.:  [id number]   MEDICAL RECORD NO.:  [id number]          PATIENT TYPE:  INP   LOCATION:  [id number]                         FACILITY:  [facility]   PHYSICIAN:  Metta Clines. Supple, M.D.  DATE OF BIRTH:  12-Aug-1946   DATE OF ADMISSION:  10/25/2005  DATE OF DISCHARGE:  10/30/2005                                 DISCHARGE SUMMARY   ADMISSION DIAGNOSES:  1.  Bilateral osteoarthrosis, right greater than left.  2.  Hypertension.  3.  Non-insulin-dependent diabetes.  4.  Restless legs syndrome.   DISCHARGE DIAGNOSES:  1.  Bilateral osteoarthrosis, right greater than left.  2.  Hypertension.  3.  Non-insulin-dependent diabetes.  4.  Restless legs syndrome.  5.  Bilateral osteoarthrosis, right greater than left.  6.  Status post right total knee arthroplasty.   OPERATION:  Right total knee arthroplasty.   SURGEON:  Metta Clines. Supple, M.D.   Terrence DupontOlivia Mackie A. Shuford, P.A.-C.   ANESTHESIA:  General.   BRIEF HISTORY:  Heather [name] is a 65 year old female followed by [place] as an  outpatient with complaints of bilateral knee pain.  She has known  osteoarthrosis in the right, greater than the left, knee.  She has failed  outpatient conservative measures at this point.  X-rays have demonstrated  bone-on-bone changes.  At this time, she wishes to proceed with total knee  arthroplasty, as indicated.  The risks and benefits were discussed at  length, and she has agreed and wishes to proceed.   HOSPITAL COURSE:  Patient was admitted, underwent the above-named procedure,  and tolerated this well.  All appropriate IV analgesics and antibiotics were  provided.  She tolerated the above-named procedure well.  Postoperatively,  she was placed on Coumadin for DVT and PE prophylaxis as well as PAS  stockings.  She was also received appropriate 24-hour antibiotics.  Started  on total knee replacement protocol, weightbearing as tolerated.  She did  have a difficult time  with postoperative pain control, requiring several  different pain regimens.  She initially did very poorly, so a rehab consult  was ordered.  She had significant pruritus and grogginess related to her  pain medicines.  Ultimately, she was weaned off the stronger pain medicines  and was on Darvocet only at this time.  She began to improve.  This helped  her pain.  She was also having some elevated fevers, so a fever workup with  a chest x-ray was ordered.   On October 30, 2005, she was afebrile.  Vital signs were stable.  Chest x-  ray was within normal limits.  Incision was clean and dry.  At this time,  she had met her therapy goals and was ready to be discharged home.   LABORATORY DATA:  Admission hemoglobin 13, postoperatively down to 8.9 but  stable.  Multiple pro times and INR's were found on the chart.  Followed by  pharmacy on Coumadin protocol.  Chemistries showed mildly elevated glucoses,  but these were nonfasting.  Otherwise, within normal limits.  Urinalysis  showed a moderate leukocyte esterase.  This  was preoperatively.  She was  treated with antibiotics and postoperatively was negative.  Blood type  showed B+.   EKG showed normal sinus rhythm.   Chest x-rays, both pre- and postoperatively, of note, were within normal  limits.   CONDITION ON DISCHARGE:  Stable and improved.   DISCHARGE MEDICATIONS:  As planned.   Patient has been discharged to home.  Home health PT and RN's are arranged.  Weightbearing as tolerated.  Total knee precautions.  Resume home  medications and home diet.  Coumadin and Darvocet.  Follow up in our office  in two weeks.  May shower at this point.      Tracy A. Shuford, P.A.-C.      Metta Clines. Supple, M.D.  Electronically Signed    TAS/MEDQ  D:  12/17/2005  T:  12/18/2005  Job:  [id number]

## 2011-09-18 LAB — BASIC METABOLIC PANEL
BUN: 12
BUN: 12
BUN: 13
BUN: 17
CO2: 27
CO2: 27
CO2: 27
CO2: 30
Calcium: 7.6 — ABNORMAL LOW
Calcium: 7.9 — ABNORMAL LOW
Calcium: 8.1 — ABNORMAL LOW
Calcium: 8.9
Chloride: 103
Chloride: 104
Chloride: 105
Chloride: 105
Creatinine, Ser: 1.02
Creatinine, Ser: 1.09
Creatinine, Ser: 1.2
Creatinine, Ser: 1.22 — ABNORMAL HIGH
GFR calc Af Amer: 54 — ABNORMAL LOW
GFR calc Af Amer: 55 — ABNORMAL LOW
GFR calc Af Amer: >60
GFR calc Af Amer: >60
GFR calc non Af Amer: 45 — ABNORMAL LOW
GFR calc non Af Amer: 46 — ABNORMAL LOW
GFR calc non Af Amer: 51 — ABNORMAL LOW
GFR calc non Af Amer: 55 — ABNORMAL LOW
Glucose, Bld: 102 — ABNORMAL HIGH
Glucose, Bld: 114 — ABNORMAL HIGH
Glucose, Bld: 150 — ABNORMAL HIGH
Glucose, Bld: 157 — ABNORMAL HIGH
Potassium: 3.9
Potassium: 4.2
Potassium: 4.2
Potassium: 4.4
Sodium: 136
Sodium: 137
Sodium: 139
Sodium: 142

## 2011-09-18 LAB — CBC
HCT: 21.3 — ABNORMAL LOW
HCT: 25.2 — ABNORMAL LOW
HCT: 25.3 — ABNORMAL LOW
HCT: 31.5 — ABNORMAL LOW
Hemoglobin: 10.7 — ABNORMAL LOW
Hemoglobin: 7.4 — CL
Hemoglobin: 8.7 — ABNORMAL LOW
Hemoglobin: 8.7 — ABNORMAL LOW
MCHC: 34.1
MCHC: 34.2
MCHC: 34.3
MCHC: 34.6
MCV: 85.9
MCV: 86.4
MCV: 86.6
MCV: 86.8
Platelets: 139 — ABNORMAL LOW
Platelets: 146 — ABNORMAL LOW
Platelets: 173
Platelets: 234
RBC: 2.48 — ABNORMAL LOW
RBC: 2.91 — ABNORMAL LOW
RBC: 2.93 — ABNORMAL LOW
RBC: 3.65 — ABNORMAL LOW
RDW: 14.3
RDW: 14.7
RDW: 15
RDW: 15.3 — ABNORMAL HIGH
WBC: 3.8 — ABNORMAL LOW
WBC: 4
WBC: 4.2
WBC: 4.5

## 2011-09-18 LAB — TYPE AND SCREEN
ABO/RH(D): B POS
Antibody Screen: NEGATIVE

## 2011-09-18 LAB — URINALYSIS, ROUTINE W REFLEX MICROSCOPIC
Glucose, UA: NEGATIVE
Hgb urine dipstick: NEGATIVE
Nitrite: NEGATIVE
Protein, ur: NEGATIVE
Specific Gravity, Urine: 1.025
Urobilinogen, UA: 0.2
pH: 5

## 2011-09-18 LAB — PREPARE RBC (CROSSMATCH)

## 2011-09-18 LAB — ABO/RH: ABO/RH(D): B POS

## 2011-09-24 LAB — BASIC METABOLIC PANEL
BUN: 12
BUN: 18
CO2: 28
CO2: 30
Calcium: 8.9
Calcium: 9
Chloride: 104
Chloride: 104
Creatinine, Ser: 1.1
Creatinine, Ser: 1.12
GFR calc Af Amer: 60 — ABNORMAL LOW
GFR calc Af Amer: >60
GFR calc non Af Amer: 49 — ABNORMAL LOW
GFR calc non Af Amer: 50 — ABNORMAL LOW
Glucose, Bld: 89
Glucose, Bld: 98
Potassium: 4.2
Potassium: 4.2
Sodium: 140
Sodium: 142

## 2011-09-24 LAB — CBC
HCT: 32.1 — ABNORMAL LOW
HCT: 32.8 — ABNORMAL LOW
Hemoglobin: 10.7 — ABNORMAL LOW
Hemoglobin: 11 — ABNORMAL LOW
MCHC: 33.4
MCHC: 33.5
MCV: 88.8
MCV: 89.7
Platelets: 328
Platelets: 362
RBC: 3.61 — ABNORMAL LOW
RBC: 3.66 — ABNORMAL LOW
RDW: 13
RDW: 13.4
WBC: 11.1 — ABNORMAL HIGH
WBC: 4.7

## 2012-01-09 DIAGNOSIS — R351 Nocturia: Secondary | ICD-10-CM | POA: Diagnosis not present

## 2012-01-09 DIAGNOSIS — N3090 Cystitis, unspecified without hematuria: Secondary | ICD-10-CM | POA: Diagnosis not present

## 2012-01-09 DIAGNOSIS — N3946 Mixed incontinence: Secondary | ICD-10-CM | POA: Diagnosis not present

## 2012-01-25 DIAGNOSIS — N3946 Mixed incontinence: Secondary | ICD-10-CM | POA: Diagnosis not present

## 2012-01-25 DIAGNOSIS — R351 Nocturia: Secondary | ICD-10-CM | POA: Diagnosis not present

## 2012-03-11 DIAGNOSIS — S0003XA Contusion of scalp, initial encounter: Secondary | ICD-10-CM | POA: Diagnosis not present

## 2012-03-11 DIAGNOSIS — S1093XA Contusion of unspecified part of neck, initial encounter: Secondary | ICD-10-CM | POA: Diagnosis not present

## 2012-03-11 DIAGNOSIS — S0993XA Unspecified injury of face, initial encounter: Secondary | ICD-10-CM | POA: Diagnosis not present

## 2012-03-11 DIAGNOSIS — S338XXA Sprain of other parts of lumbar spine and pelvis, initial encounter: Secondary | ICD-10-CM | POA: Diagnosis not present

## 2012-03-11 DIAGNOSIS — S41009A Unspecified open wound of unspecified shoulder, initial encounter: Secondary | ICD-10-CM | POA: Diagnosis not present

## 2012-03-11 DIAGNOSIS — S335XXA Sprain of ligaments of lumbar spine, initial encounter: Secondary | ICD-10-CM | POA: Diagnosis not present

## 2012-03-11 DIAGNOSIS — IMO0002 Reserved for concepts with insufficient information to code with codable children: Secondary | ICD-10-CM | POA: Diagnosis not present

## 2012-03-11 DIAGNOSIS — W010XXA Fall on same level from slipping, tripping and stumbling without subsequent striking against object, initial encounter: Secondary | ICD-10-CM | POA: Diagnosis not present

## 2012-03-13 DIAGNOSIS — E7800 Pure hypercholesterolemia, unspecified: Secondary | ICD-10-CM | POA: Diagnosis not present

## 2012-03-13 DIAGNOSIS — E119 Type 2 diabetes mellitus without complications: Secondary | ICD-10-CM | POA: Diagnosis not present

## 2012-03-13 DIAGNOSIS — Z79899 Other long term (current) drug therapy: Secondary | ICD-10-CM | POA: Diagnosis not present

## 2012-03-17 DIAGNOSIS — E7800 Pure hypercholesterolemia, unspecified: Secondary | ICD-10-CM | POA: Diagnosis not present

## 2012-03-17 DIAGNOSIS — E1139 Type 2 diabetes mellitus with other diabetic ophthalmic complication: Secondary | ICD-10-CM | POA: Diagnosis not present

## 2012-03-17 DIAGNOSIS — M79609 Pain in unspecified limb: Secondary | ICD-10-CM | POA: Diagnosis not present

## 2012-03-17 DIAGNOSIS — K5901 Slow transit constipation: Secondary | ICD-10-CM | POA: Diagnosis not present

## 2012-03-17 DIAGNOSIS — K219 Gastro-esophageal reflux disease without esophagitis: Secondary | ICD-10-CM | POA: Diagnosis not present

## 2012-03-17 DIAGNOSIS — R0989 Other specified symptoms and signs involving the circulatory and respiratory systems: Secondary | ICD-10-CM | POA: Diagnosis not present

## 2012-03-31 DIAGNOSIS — J301 Allergic rhinitis due to pollen: Secondary | ICD-10-CM | POA: Diagnosis not present

## 2012-03-31 DIAGNOSIS — R609 Edema, unspecified: Secondary | ICD-10-CM | POA: Diagnosis not present

## 2012-03-31 DIAGNOSIS — N3000 Acute cystitis without hematuria: Secondary | ICD-10-CM | POA: Diagnosis not present

## 2012-04-02 DIAGNOSIS — I658 Occlusion and stenosis of other precerebral arteries: Secondary | ICD-10-CM | POA: Diagnosis not present

## 2012-04-02 DIAGNOSIS — R0989 Other specified symptoms and signs involving the circulatory and respiratory systems: Secondary | ICD-10-CM | POA: Diagnosis not present

## 2012-04-02 DIAGNOSIS — I6529 Occlusion and stenosis of unspecified carotid artery: Secondary | ICD-10-CM | POA: Diagnosis not present

## 2012-04-10 DIAGNOSIS — M25569 Pain in unspecified knee: Secondary | ICD-10-CM | POA: Diagnosis not present

## 2012-04-16 DIAGNOSIS — M79609 Pain in unspecified limb: Secondary | ICD-10-CM | POA: Diagnosis not present

## 2012-04-29 DIAGNOSIS — N3946 Mixed incontinence: Secondary | ICD-10-CM | POA: Diagnosis not present

## 2012-04-29 DIAGNOSIS — R351 Nocturia: Secondary | ICD-10-CM | POA: Diagnosis not present

## 2012-05-29 DIAGNOSIS — E1139 Type 2 diabetes mellitus with other diabetic ophthalmic complication: Secondary | ICD-10-CM | POA: Diagnosis not present

## 2012-06-10 DIAGNOSIS — M25519 Pain in unspecified shoulder: Secondary | ICD-10-CM | POA: Diagnosis not present

## 2012-06-16 DIAGNOSIS — E1139 Type 2 diabetes mellitus with other diabetic ophthalmic complication: Secondary | ICD-10-CM | POA: Diagnosis not present

## 2012-06-16 DIAGNOSIS — E7800 Pure hypercholesterolemia, unspecified: Secondary | ICD-10-CM | POA: Diagnosis not present

## 2012-06-16 DIAGNOSIS — M25519 Pain in unspecified shoulder: Secondary | ICD-10-CM | POA: Diagnosis not present

## 2012-06-16 DIAGNOSIS — K219 Gastro-esophageal reflux disease without esophagitis: Secondary | ICD-10-CM | POA: Diagnosis not present

## 2012-07-03 DIAGNOSIS — S90129A Contusion of unspecified lesser toe(s) without damage to nail, initial encounter: Secondary | ICD-10-CM | POA: Diagnosis not present

## 2012-07-03 DIAGNOSIS — B351 Tinea unguium: Secondary | ICD-10-CM | POA: Diagnosis not present

## 2012-08-13 DIAGNOSIS — L820 Inflamed seborrheic keratosis: Secondary | ICD-10-CM | POA: Diagnosis not present

## 2012-08-13 DIAGNOSIS — R3919 Other difficulties with micturition: Secondary | ICD-10-CM | POA: Diagnosis not present

## 2012-08-13 DIAGNOSIS — N760 Acute vaginitis: Secondary | ICD-10-CM | POA: Diagnosis not present

## 2012-08-13 DIAGNOSIS — N390 Urinary tract infection, site not specified: Secondary | ICD-10-CM | POA: Diagnosis not present

## 2012-09-17 DIAGNOSIS — E1139 Type 2 diabetes mellitus with other diabetic ophthalmic complication: Secondary | ICD-10-CM | POA: Diagnosis not present

## 2012-09-17 DIAGNOSIS — Z23 Encounter for immunization: Secondary | ICD-10-CM | POA: Diagnosis not present

## 2012-09-17 DIAGNOSIS — E7800 Pure hypercholesterolemia, unspecified: Secondary | ICD-10-CM | POA: Diagnosis not present

## 2012-09-17 DIAGNOSIS — Z79899 Other long term (current) drug therapy: Secondary | ICD-10-CM | POA: Diagnosis not present

## 2012-09-19 DIAGNOSIS — E7800 Pure hypercholesterolemia, unspecified: Secondary | ICD-10-CM | POA: Diagnosis not present

## 2012-09-19 DIAGNOSIS — E1139 Type 2 diabetes mellitus with other diabetic ophthalmic complication: Secondary | ICD-10-CM | POA: Diagnosis not present

## 2012-09-19 DIAGNOSIS — Z79899 Other long term (current) drug therapy: Secondary | ICD-10-CM | POA: Diagnosis not present

## 2012-10-29 DIAGNOSIS — Z853 Personal history of malignant neoplasm of breast: Secondary | ICD-10-CM | POA: Diagnosis not present

## 2012-11-03 DIAGNOSIS — S93609A Unspecified sprain of unspecified foot, initial encounter: Secondary | ICD-10-CM | POA: Diagnosis not present

## 2012-11-17 DIAGNOSIS — D0590 Unspecified type of carcinoma in situ of unspecified breast: Secondary | ICD-10-CM | POA: Diagnosis not present

## 2012-11-17 DIAGNOSIS — E669 Obesity, unspecified: Secondary | ICD-10-CM | POA: Diagnosis not present

## 2012-11-19 DIAGNOSIS — F329 Major depressive disorder, single episode, unspecified: Secondary | ICD-10-CM | POA: Diagnosis not present

## 2012-11-19 DIAGNOSIS — N3941 Urge incontinence: Secondary | ICD-10-CM | POA: Diagnosis not present

## 2012-11-19 DIAGNOSIS — E1139 Type 2 diabetes mellitus with other diabetic ophthalmic complication: Secondary | ICD-10-CM | POA: Diagnosis not present

## 2012-11-19 DIAGNOSIS — E7800 Pure hypercholesterolemia, unspecified: Secondary | ICD-10-CM | POA: Diagnosis not present

## 2012-11-24 DIAGNOSIS — S93609A Unspecified sprain of unspecified foot, initial encounter: Secondary | ICD-10-CM | POA: Diagnosis not present

## 2012-12-09 DIAGNOSIS — J1189 Influenza due to unidentified influenza virus with other manifestations: Secondary | ICD-10-CM | POA: Diagnosis not present

## 2012-12-11 DIAGNOSIS — J069 Acute upper respiratory infection, unspecified: Secondary | ICD-10-CM | POA: Diagnosis not present

## 2012-12-18 DIAGNOSIS — N189 Chronic kidney disease, unspecified: Secondary | ICD-10-CM | POA: Diagnosis not present

## 2012-12-18 DIAGNOSIS — E7800 Pure hypercholesterolemia, unspecified: Secondary | ICD-10-CM | POA: Diagnosis not present

## 2012-12-18 DIAGNOSIS — E1139 Type 2 diabetes mellitus with other diabetic ophthalmic complication: Secondary | ICD-10-CM | POA: Diagnosis not present

## 2012-12-18 DIAGNOSIS — Z79899 Other long term (current) drug therapy: Secondary | ICD-10-CM | POA: Diagnosis not present

## 2012-12-18 DIAGNOSIS — Z0000 Encounter for general adult medical examination without abnormal findings: Secondary | ICD-10-CM | POA: Diagnosis not present

## 2012-12-18 DIAGNOSIS — M899 Disorder of bone, unspecified: Secondary | ICD-10-CM | POA: Diagnosis not present

## 2012-12-22 DIAGNOSIS — N189 Chronic kidney disease, unspecified: Secondary | ICD-10-CM | POA: Diagnosis not present

## 2012-12-22 DIAGNOSIS — M949 Disorder of cartilage, unspecified: Secondary | ICD-10-CM | POA: Diagnosis not present

## 2012-12-22 DIAGNOSIS — Z0000 Encounter for general adult medical examination without abnormal findings: Secondary | ICD-10-CM | POA: Diagnosis not present

## 2012-12-22 DIAGNOSIS — E7800 Pure hypercholesterolemia, unspecified: Secondary | ICD-10-CM | POA: Diagnosis not present

## 2012-12-22 DIAGNOSIS — Z79899 Other long term (current) drug therapy: Secondary | ICD-10-CM | POA: Diagnosis not present

## 2012-12-22 DIAGNOSIS — E1139 Type 2 diabetes mellitus with other diabetic ophthalmic complication: Secondary | ICD-10-CM | POA: Diagnosis not present

## 2012-12-23 DIAGNOSIS — Z13820 Encounter for screening for osteoporosis: Secondary | ICD-10-CM | POA: Diagnosis not present

## 2013-01-26 DIAGNOSIS — M79609 Pain in unspecified limb: Secondary | ICD-10-CM | POA: Diagnosis not present

## 2013-01-26 DIAGNOSIS — M25569 Pain in unspecified knee: Secondary | ICD-10-CM | POA: Diagnosis not present

## 2013-01-29 DIAGNOSIS — M1710 Unilateral primary osteoarthritis, unspecified knee: Secondary | ICD-10-CM | POA: Diagnosis not present

## 2013-01-29 DIAGNOSIS — M25559 Pain in unspecified hip: Secondary | ICD-10-CM | POA: Diagnosis not present

## 2013-02-04 DIAGNOSIS — M1710 Unilateral primary osteoarthritis, unspecified knee: Secondary | ICD-10-CM | POA: Diagnosis not present

## 2013-02-04 DIAGNOSIS — M25559 Pain in unspecified hip: Secondary | ICD-10-CM | POA: Diagnosis not present

## 2013-02-06 DIAGNOSIS — M25559 Pain in unspecified hip: Secondary | ICD-10-CM | POA: Diagnosis not present

## 2013-02-06 DIAGNOSIS — M1710 Unilateral primary osteoarthritis, unspecified knee: Secondary | ICD-10-CM | POA: Diagnosis not present

## 2013-02-11 DIAGNOSIS — M25559 Pain in unspecified hip: Secondary | ICD-10-CM | POA: Diagnosis not present

## 2013-02-11 DIAGNOSIS — M1710 Unilateral primary osteoarthritis, unspecified knee: Secondary | ICD-10-CM | POA: Diagnosis not present

## 2013-02-16 DIAGNOSIS — M1710 Unilateral primary osteoarthritis, unspecified knee: Secondary | ICD-10-CM | POA: Diagnosis not present

## 2013-02-16 DIAGNOSIS — M25559 Pain in unspecified hip: Secondary | ICD-10-CM | POA: Diagnosis not present

## 2013-02-18 DIAGNOSIS — M1710 Unilateral primary osteoarthritis, unspecified knee: Secondary | ICD-10-CM | POA: Diagnosis not present

## 2013-02-18 DIAGNOSIS — M25559 Pain in unspecified hip: Secondary | ICD-10-CM | POA: Diagnosis not present

## 2013-02-19 DIAGNOSIS — M25559 Pain in unspecified hip: Secondary | ICD-10-CM | POA: Diagnosis not present

## 2013-02-19 DIAGNOSIS — M1710 Unilateral primary osteoarthritis, unspecified knee: Secondary | ICD-10-CM | POA: Diagnosis not present

## 2013-02-25 DIAGNOSIS — M1710 Unilateral primary osteoarthritis, unspecified knee: Secondary | ICD-10-CM | POA: Diagnosis not present

## 2013-02-25 DIAGNOSIS — M25559 Pain in unspecified hip: Secondary | ICD-10-CM | POA: Diagnosis not present

## 2013-02-27 DIAGNOSIS — M25559 Pain in unspecified hip: Secondary | ICD-10-CM | POA: Diagnosis not present

## 2013-02-27 DIAGNOSIS — M1710 Unilateral primary osteoarthritis, unspecified knee: Secondary | ICD-10-CM | POA: Diagnosis not present

## 2013-03-02 DIAGNOSIS — M25559 Pain in unspecified hip: Secondary | ICD-10-CM | POA: Diagnosis not present

## 2013-03-02 DIAGNOSIS — M1710 Unilateral primary osteoarthritis, unspecified knee: Secondary | ICD-10-CM | POA: Diagnosis not present

## 2013-03-18 DIAGNOSIS — M2010 Hallux valgus (acquired), unspecified foot: Secondary | ICD-10-CM | POA: Diagnosis not present

## 2013-03-18 DIAGNOSIS — E1149 Type 2 diabetes mellitus with other diabetic neurological complication: Secondary | ICD-10-CM | POA: Diagnosis not present

## 2013-03-18 DIAGNOSIS — M79609 Pain in unspecified limb: Secondary | ICD-10-CM | POA: Diagnosis not present

## 2013-04-16 DIAGNOSIS — E1169 Type 2 diabetes mellitus with other specified complication: Secondary | ICD-10-CM | POA: Diagnosis not present

## 2013-04-16 DIAGNOSIS — M2010 Hallux valgus (acquired), unspecified foot: Secondary | ICD-10-CM | POA: Diagnosis not present

## 2013-05-15 ENCOUNTER — Encounter: Payer: Self-pay | Admitting: "Endocrinology

## 2013-05-15 ENCOUNTER — Ambulatory Visit (INDEPENDENT_AMBULATORY_CARE_PROVIDER_SITE_OTHER): Payer: Medicare Other | Admitting: "Endocrinology

## 2013-05-15 VITALS — BP 134/65 | Wt 257.0 lb

## 2013-05-15 DIAGNOSIS — I798 Other disorders of arteries, arterioles and capillaries in diseases classified elsewhere: Secondary | ICD-10-CM

## 2013-05-15 DIAGNOSIS — E113593 Type 2 diabetes mellitus with proliferative diabetic retinopathy without macular edema, bilateral: Secondary | ICD-10-CM | POA: Insufficient documentation

## 2013-05-15 DIAGNOSIS — E119 Type 2 diabetes mellitus without complications: Secondary | ICD-10-CM | POA: Insufficient documentation

## 2013-05-15 DIAGNOSIS — M79673 Pain in unspecified foot: Secondary | ICD-10-CM | POA: Insufficient documentation

## 2013-05-15 DIAGNOSIS — E1142 Type 2 diabetes mellitus with diabetic polyneuropathy: Secondary | ICD-10-CM | POA: Insufficient documentation

## 2013-05-15 DIAGNOSIS — M25579 Pain in unspecified ankle and joints of unspecified foot: Secondary | ICD-10-CM

## 2013-05-15 DIAGNOSIS — E1159 Type 2 diabetes mellitus with other circulatory complications: Secondary | ICD-10-CM

## 2013-05-15 DIAGNOSIS — M1990 Unspecified osteoarthritis, unspecified site: Secondary | ICD-10-CM | POA: Insufficient documentation

## 2013-05-15 DIAGNOSIS — R609 Edema, unspecified: Secondary | ICD-10-CM

## 2013-05-15 DIAGNOSIS — E11359 Type 2 diabetes mellitus with proliferative diabetic retinopathy without macular edema: Secondary | ICD-10-CM

## 2013-05-15 DIAGNOSIS — E1339 Other specified diabetes mellitus with other diabetic ophthalmic complication: Secondary | ICD-10-CM

## 2013-05-15 DIAGNOSIS — M25571 Pain in right ankle and joints of right foot: Secondary | ICD-10-CM

## 2013-05-15 DIAGNOSIS — G510 Bell's palsy: Secondary | ICD-10-CM | POA: Insufficient documentation

## 2013-05-15 DIAGNOSIS — E1149 Type 2 diabetes mellitus with other diabetic neurological complication: Secondary | ICD-10-CM

## 2013-05-15 DIAGNOSIS — M79609 Pain in unspecified limb: Secondary | ICD-10-CM

## 2013-05-15 DIAGNOSIS — E1151 Type 2 diabetes mellitus with diabetic peripheral angiopathy without gangrene: Secondary | ICD-10-CM

## 2013-05-15 DIAGNOSIS — R600 Localized edema: Secondary | ICD-10-CM | POA: Insufficient documentation

## 2013-05-15 LAB — POCT GLYCOSYLATED HEMOGLOBIN (HGB A1C): Hemoglobin A1C: 6.8

## 2013-05-15 LAB — GLUCOSE, POCT (MANUAL RESULT ENTRY): POC Glucose: 120 mg/dl — AB (ref 70–99)

## 2013-05-15 NOTE — Patient Instructions (Signed)
No follow up visit at this time.

## 2013-05-15 NOTE — Progress Notes (Signed)
Subjective:  Patient Name: Heather [name] Date of Birth: 1946/11/16  MRN: [id number]  Heather [name]  presents to the office today for initial evaluation and management of her type 2 diabetes mellitus and foot pains.  HISTORY OF PRESENT ILLNESS:   Heather [name] is a 67 y.o. Caucasian woman. Heather [name] was unaccompanied.   1. The patient had her initial endocrine evaluation today, 05/15/13. She was referred by her sister-in-law, Heather. Heather [name], one of our long-term patients from [place]. Although I am currently not accepting new adult patients, I saw the patient for evaluation as a courtesy to Heather. Heather [name].  A. Heather [name] was diagnosed with T2DM about 38 years ago, but was "borderline" for several years before that time. She probably weighed about 240-245 lbs, at a height of 67 inches. She dropped to about 180 lbs in 2003 by following a version of the Atkins Diet and exercising with a personal trainer. She then re-married in 2004, moved from [place] to [place], exercised less, and changed her diet. Her  weight slowly but progressively increased again.   B. The patient was treated with Actos 45 mg and metformin for many years, but Dr. Greig Right, her family physician in [place], took her off the metformin and started Bydureon, 2 mg, once weekly, about 14 months ago.  C. Her A1cs have varied from 6.0-7.4%. She has never had to be hospitalized or go to the ED because of DM. However, in May 2009 she had multiple laser treatments bilaterally for 37 lesions of Proliferative Diabetic Retinopathy. In the past 2 month she has been having higher heart rates. She has also has tingling and unusual pressure sensations in her feet off and on for about 4 years.   D. Foot problems: The patient has had a past history of falls occurring suddenly and unexpectedly for the past several years. As a result of some of those falls she has had trauma to her ankles and feet, including fractures of both feet. She had to have her left foot  fracture repaired by inserting 4 pins into her left foot. In August 2013 she developed pains and swelling of the right lateral foot at the head of the 5th metatarsal, but no heat or redness. When she wore the boot, kept the foot elevated, and stayed off her foot, the swelling and pain resolved. Several months later she developed a new pain and swelling in the dorsum of her right foot. She was put into an ortho shoe for several months, but was not always compliant. The pins and swelling in the dorsum of the right foot also gradually resolved. During the past several months, however, she has developed new pain and swelling of her right ankle at the site where her anterolateral collateral ligaments insert onto the bones of the right midfoot. Although she sees herself having had almost a year of continuous foot pain and swelling, a careful history shows that she has had at least three different sites of pain (dolor) and swelling (tumor) at different times during the past 10 months. Perhaps more importantly, she has not had any redness (rubor) or heat (calor) at any of these sites. She also has a ring of discoloration around the lowest part of her right calf, c/w diabetic dermopathy. She has also had chronic, intermittent pedal edema for many years.   2. The patient's past medical history includes:  A. Fractures and multiple surgeries on both feet  B. Bilateral proliferative diabetic retinopathy  C. Low back pain and  bilateral sciatica  D. Possible renal insufficiency as the cause of discontinuing her metformin  E. Chronic, morbid obesity  F. Chronic T2DM  G. Osteoarthritis  H. No known drug allergies  I. Vitamin D, vitamin B 12, Cymbalta, Bydureon, Womens MVI, Actos 45 mg, Evista, Requip, Crestor 40 mg/day  3. Pertinent Review of Systems:  Constitutional: The patient feels "about 90% physically". "I get aches and pains, but can push.".... herself to do what she needs to do.  She frequently feels depressed  and has felt so for 34 years, since her first husband, a Occupational psychologist, was killed in the line of duty. 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 She has two grown sons, one in [place] and one in [place], [place]. She has about 5% joy in her life. She has a lot of nasal congestion, probably due to allergies. Eyes: Vision is good. Her last eye exam was in June 2213. She has a FU appointment later this month. There are no significant eye complaints. Neck: She has frequent difficulties swallowing solids, about 1-2 times per week. Sometimes this occurs when she did not chew food well. Sometimes she chokes or has difficulty swallowing if she gets distracted while swallowing.  Sometimes her mouth is not moist enough. Sometimes she chokes on her own saliva and coughs.  The patient has no complaints of anterior neck swelling, soreness, tenderness,  pressure, or discomfort.  Heart: Heart sometimes races and pounds, especially when she lies on her left side or back at night. Heart rate increases with exercise or other physical activity. The patient has no other complaints of palpitations, irregular heat beats, chest pain, or chest pressure. Gastrointestinal: She has frequent acid indigestion and reflux. Bowel movents seem normal. The patient has no complaints of excessive hunger, upset stomach, stomach aches or pains, diarrhea, or constipation. Legs: As above. Muscle mass and strength seem normal. There are no other complaints of numbness, tingling, burning, or pain. Chronic  pedal edema for years, occurring about 30% of the time. No edema is noted. She did take Lasix intermittently for some years, but has not had any recently. She does not usually restrict her salt intake.  Feet: As above.  GYN: Vaginal hysterectomy 1986: She only had one hot flash thereafter.  Emotional: "I have a lot of issues."  Actually, she has had a lot of issues for more than 30 years. She has been chronically depressed since her first husband died 65 years ago. When she moved from [place] to [place] she lost contact with the church and "church family" that she really enjoyed. She has no friends in [place] and is not happy in [place]. She is alone a lot, even in her marriage. She often won't talk with others, such as her previous therapists or husband, about her feelings of isolation and about what is really important to her. She admits to being "overly analytical". Growing up she heard over and over again from her parents, "What will people think?" As we talked about these issues today, she suddenly stopped the conversation about emotional issues in mid-sentence and said "Enough!". Mental: Her "IQ used to be 118". She has had many jobs that required a lot of intelligence and mental sharpness on her part. In the past few years, however, she has some problems with paying attention and remembering things, especially if she does not pay attention.  Memory is not as good as it used to be. She has no problems with thinking and making decisions.    PAST MEDICAL, FAMILY, AND SOCIAL HISTORY:  Past Medical History  Diagnosis Date  . Diabetes mellitus, type II   . Restless legs 2012  . Osteoarthritis   . Proliferative diabetic retinopathy of both eyes   . Bell's palsy   . Breast cancer   . Low back pain with sciatica     Family History  Problem Relation Age of Onset  . Diabetes Father   . Heart disease Father   . Diabetes Paternal Aunt     3 aunts had DM  . Thyroid disease Maternal Grandmother   . Diabetes Maternal Grandfather     Current outpatient prescriptions:Cholecalciferol 2000 UNITS TABS, Take by mouth., Disp: , Rfl: ;  Cyanocobalamin (VITAMIN B-12 PO), Take by mouth., Disp: , Rfl: ;  DULoxetine (CYMBALTA) 60 MG capsule, Take 60 mg by mouth daily., Disp: , Rfl: ;  Exenatide ER (BYDUREON) 2 MG SUSR, Inject into  the skin once a week., Disp: , Rfl: ;  Multiple Vitamins-Minerals (WOMENS MULTIVITAMIN PLUS PO), Take by mouth., Disp: , Rfl:  pioglitazone (ACTOS) 45 MG tablet, Take 45 mg by mouth daily., Disp: , Rfl: ;  raloxifene (EVISTA) 60 MG tablet, Take 60 mg by mouth daily., Disp: , Rfl: ;  rOPINIRole (REQUIP) 1 MG tablet, Take 1 mg by mouth 3 (three) times daily., Disp: , Rfl: ;  rosuvastatin (CRESTOR) 40 MG tablet, Take 40 mg by mouth daily., Disp: , Rfl:   Allergies as of 05/15/2013 - Review Complete 05/15/2013  Allergen Reaction Noted  . Sulfa antibiotics  05/15/2013    1. Work and Family: Married, but having some marital issues and financial issues. 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 Activities: Was doing volunteer work in [employer] until her foot problems. She no longer participates in church activities since moving to [place]. She likes to do "combat shopping", loves to travel and to go to the beach. She wants to move back to [place] or to [place], [place] to be near her son and grandchildren. Her husband,however, refuses to leave [place]. 3. Smoking, alcohol, or drugs: none 4. Primary Care Provider: Dr. Greig Right in [place]: There are no other significant problems involving Ricki's other body systems.   Objective:  Vital Signs:  BP 134/65  Wt 257 lb (116.574 kg)   Ht Readings from Last 3 Encounters:  No data found for Ht   Wt Readings from Last 3 Encounters:  05/15/13 257 lb (116.574 kg)   HC Readings from Last 3 Encounters:  No data found for [facility]   There is no height on file to calculate BSA.  Normalized stature-for-age data available only for age 54 to 8 years. Normalized weight-for-age data available only for age 54 to 20 years. The patient is morbidly obese. Based upon a height of 67 inches, her ideal body weight (IBW) is 135 pounds. She weighs more than 100 pounds above her IBW.  PHYSICAL EXAM:  Constitutional: The patient  appears healthy, but morbidly obese. She is alert and oriented x 3. She was visibly depressed at times during the visit, but was also upbeat at other times. She seems to be stuck in a rut in [place] and can't seem to find her way out.  Face: The face appears normal.  Eyes: There is no obvious arcus or proptosis. Moisture appears normal. Mouth:  The oropharynx and tongue appear normal. Oral moisture is normal. Neck: The neck appears to be visibly normal. No carotid bruits are noted. The thyroid gland is normal, at 18-20 grams in size. The consistency of the thyroid gland is normal. The thyroid gland is not tender to palpation. Lungs: The lungs are clear to auscultation. Air movement is good. Heart: Heart rate and rhythm are regular. Heart sounds S1 and S2 are normal. I did not appreciate any pathologic cardiac murmurs. Abdomen: The abdomen is enlarged, comparable to her degree of obesity. Bowel sounds are normal. There is no obvious hepatomegaly, splenomegaly, or other mass effect.  Arms: Muscle size and bulk are normal for age. Hands: There is no obvious tremor. Phalangeal and metacarpophalangeal joints are normal. Palmar muscles are normal. Palmar skin is normal. Palmar moisture is also normal. Legs: Muscles appear normal for age. She has 2-3+ pedal edema on the right and 2+ on the left.  Feet: Feet are normally formed. Dorsalis pedal pulses are trace. She is exquisitely tender at the inferior aspect of her right ankle anterolateral ligament where it inserts into the midfoot.  Neurologic: Strength is normal for age in both the upper and lower extremities. Muscle tone is normal. Sensation to touch is normal in both the legs and feet.  In fact, the soles of her feet are hypersensitive to touch today.  LAB DATA:  Results for orders placed in visit on 05/15/13 (from the past 504 hour(s))  GLUCOSE, POCT (MANUAL RESULT ENTRY)   Collection Time    05/15/13 10:20 AM      Result Value Range   POC  Glucose 120 (*) 70 - 99 mg/dl  POCT GLYCOSYLATED HEMOGLOBIN (HGB A1C)   Collection Time    05/15/13 10:23 AM      Result Value Range   Hemoglobin A1C 6.8    Hemoglobin A1c today is 6.8%.   Assessment and Plan:   ASSESSMENT:  1. T2DM: By HbA1c she meets the criteria for good BG control from the ADA, but is a bit higher than the 6.5% standard set by AACE. 2. Proliferative diabetic retinopathy: This has been a problem for her in the past.  3. Peripheral neuropathy: She as some subjective neuropathy. She has at least three reason for her neuropathy: Chronic T2DM, chronic, but intermittent, low back pain with sciatica, and fracture-related trauma and surgical trauma to her feet.  4. Right foot and ankle pains: Her foot pains and swelling that have occurred at different sites and times are most likely related to foot traumas. Her foot traumas, in turn, have been caused by a  combination of multiple falls and injuries to her feet and ankles, the chronic wear and tear on her ligaments and joints due to morbid obesity, and the fractures and surgical traumas that have occurred. it is also possible that she might have some gout. I do not see any evidence by history or by physical exam that she has a "Charcot's foot". 5. Peripheral angiopathy: She needs to walk more, or do water aerobics, or use a recumbent bike.  6. Pedal edema: It's unclear how much of her edema is due to excess salt intake, how much is due to her age-related decreased ability of her kidneys to excrete a salt and water load, and how much is due to her maximum dose of Actos.   PLAN:  1. Diagnostic: CMP, CBC, TFTs, C-peptide, uric acid, urinary mcroalbumin/creatinine ratio 2. Therapeutic: Consider stopping Actos and re-instituting metformin. Consider adding a  DPP-4 inhibitor or an SGLT-2 agent.  3. Patient education: We discussed all of her issues at length.  4. Follow-up: We'll discuss her lab results by telephone and develop a further  therapeutic plan for her.  I will probably refer her to Dr. Lorne Skeens, MD, for further adult endocrine care..   Level of Service: This visit lasted in excess of 130 minutes. More than 50% of the visit was devoted to counseling.  Sherrlyn Hock, MD 05/15/2013 10:41 PM

## 2013-05-16 LAB — MICROALBUMIN / CREATININE URINE RATIO: Microalb Creat Ratio: 5.6 mg/g (ref 0.0–30.0)

## 2013-05-16 LAB — COMPREHENSIVE METABOLIC PANEL
AST: 18 U/L (ref 0–37)
Albumin: 3.7 g/dL (ref 3.5–5.2)
BUN: 21 mg/dL (ref 6–23)
CO2: 27 mEq/L (ref 19–32)
Calcium: 9.3 mg/dL (ref 8.4–10.5)
Chloride: 103 mEq/L (ref 96–112)
Potassium: 4.5 mEq/L (ref 3.5–5.3)

## 2013-05-16 LAB — CBC
HCT: 37.1 % (ref 36.0–46.0)
Hemoglobin: 11.8 g/dL — ABNORMAL LOW (ref 12.0–15.0)
RBC: 3.96 MIL/uL (ref 3.87–5.11)
RDW: 13.6 % (ref 11.5–15.5)
WBC: 3 10*3/uL — ABNORMAL LOW (ref 4.0–10.5)

## 2013-05-16 LAB — URIC ACID: Uric Acid, Serum: 6.7 mg/dL (ref 2.4–7.0)

## 2013-05-16 LAB — TSH: TSH: 0.762 u[IU]/mL (ref 0.350–4.500)

## 2013-05-18 DIAGNOSIS — E1129 Type 2 diabetes mellitus with other diabetic kidney complication: Secondary | ICD-10-CM | POA: Diagnosis not present

## 2013-05-18 DIAGNOSIS — E7800 Pure hypercholesterolemia, unspecified: Secondary | ICD-10-CM | POA: Diagnosis not present

## 2013-05-18 DIAGNOSIS — E1139 Type 2 diabetes mellitus with other diabetic ophthalmic complication: Secondary | ICD-10-CM | POA: Diagnosis not present

## 2013-05-18 DIAGNOSIS — F329 Major depressive disorder, single episode, unspecified: Secondary | ICD-10-CM | POA: Diagnosis not present

## 2013-05-21 DIAGNOSIS — F39 Unspecified mood [affective] disorder: Secondary | ICD-10-CM | POA: Diagnosis not present

## 2013-05-26 DIAGNOSIS — E1139 Type 2 diabetes mellitus with other diabetic ophthalmic complication: Secondary | ICD-10-CM | POA: Diagnosis not present

## 2013-05-28 DIAGNOSIS — R32 Unspecified urinary incontinence: Secondary | ICD-10-CM | POA: Diagnosis not present

## 2013-05-28 DIAGNOSIS — F39 Unspecified mood [affective] disorder: Secondary | ICD-10-CM | POA: Diagnosis not present

## 2013-05-28 DIAGNOSIS — N3000 Acute cystitis without hematuria: Secondary | ICD-10-CM | POA: Diagnosis not present

## 2013-05-28 DIAGNOSIS — F329 Major depressive disorder, single episode, unspecified: Secondary | ICD-10-CM | POA: Diagnosis not present

## 2013-05-29 ENCOUNTER — Telehealth: Payer: Self-pay | Admitting: "Endocrinology

## 2013-05-29 NOTE — Telephone Encounter (Signed)
1. I called Dr. Burnett Sheng, [facility], at [phone] and discussed Ms. Beller's case and recent lab results. Dr. Burnett Sheng shared the information that her serum creatinine on 09/22/12 was 1.49. It was that value that prompted Dr. Burnett Sheng to discontinue metformin. Dr. Burnett Sheng suspected that Ms. Zook was taking too much ibuprofen at the time and that it was the ibuprofen that was causing the elevated creatinine. To be on the safe side, however, Dr. Burnett Sheng stopped the metformin.  2. I told Dr. Burnett Sheng that If I were going to be her endocrinologist in the future, I would be willing to re-start the metformin, stop the Actos, and consider starting one of the other DM medications, such as Invokana. However, since I am trying now to refer her to Dr. Legrand Como Altheimer at his new office, it does not make sense to change her medications now. Dr. Burnett Sheng concurred. Sherrlyn Hock

## 2013-05-29 NOTE — Telephone Encounter (Signed)
A. I contacted the patient on her cell phone with her recent lab results:  1. CMP showed her creatinine at 1.23, which according to the new "normal range" of 0.50-1.10 is elevated. I told her that that level of creatinine is actually very normal for people of her age and mine. I would have no problems treating her with metformin. I would also prefer to stop her Actos, since it is probably contributing to her edema and is certainly contributing to her obesity.  2. CBC showed low-normal WBC, slightly low hemoglobin, and normal hematocrit. Her RBC indices are actually the best that they have been according to the lab results available since 2008.   3. Her urine protein is normal.   4. Her TFTs are normal.  5. Her insulin C-peptide value of 2.3 (normal 0.80-3.90) indicates that she is still producing a fair amount of insulin. Bydureon is helping that production.   6. Her uric acid level of 6.7 is within normal limits of 2.4-7.0. I wonder if there are times when her uric acid is higher and if she may have intermittent gout symptoms as a result.  B. The endocrinologist that I would like to refer her to, Dr. Legrand Como Altheimer, has not finalized his new office arrangements. He has not been available to talk with in reference to referring the patient to him. C. I told her that I would be willing to talk with Dr. Burnett Sheng re re-starting metformin and stopping Actos. If I continue to see her that is what I would do. However, if Dr. Elyse Hsu takes her as a patient he may have other ideas. The patient asked me to call Dr. Burnett Sheng and to discuss the issue with her. I said that I would. Sherrlyn Hock

## 2013-06-04 DIAGNOSIS — F39 Unspecified mood [affective] disorder: Secondary | ICD-10-CM | POA: Diagnosis not present

## 2013-06-11 DIAGNOSIS — F39 Unspecified mood [affective] disorder: Secondary | ICD-10-CM | POA: Diagnosis not present

## 2013-06-17 DIAGNOSIS — F39 Unspecified mood [affective] disorder: Secondary | ICD-10-CM | POA: Diagnosis not present

## 2013-06-24 DIAGNOSIS — F39 Unspecified mood [affective] disorder: Secondary | ICD-10-CM | POA: Diagnosis not present

## 2013-07-01 DIAGNOSIS — F39 Unspecified mood [affective] disorder: Secondary | ICD-10-CM | POA: Diagnosis not present

## 2013-07-09 DIAGNOSIS — F39 Unspecified mood [affective] disorder: Secondary | ICD-10-CM | POA: Diagnosis not present

## 2013-07-14 DIAGNOSIS — F39 Unspecified mood [affective] disorder: Secondary | ICD-10-CM | POA: Diagnosis not present

## 2013-07-16 DIAGNOSIS — F39 Unspecified mood [affective] disorder: Secondary | ICD-10-CM | POA: Diagnosis not present

## 2013-07-27 DIAGNOSIS — F39 Unspecified mood [affective] disorder: Secondary | ICD-10-CM | POA: Diagnosis not present

## 2013-08-11 DIAGNOSIS — F39 Unspecified mood [affective] disorder: Secondary | ICD-10-CM | POA: Diagnosis not present

## 2013-08-13 DIAGNOSIS — F39 Unspecified mood [affective] disorder: Secondary | ICD-10-CM | POA: Diagnosis not present

## 2013-08-17 DIAGNOSIS — D649 Anemia, unspecified: Secondary | ICD-10-CM | POA: Diagnosis not present

## 2013-08-17 DIAGNOSIS — Z79899 Other long term (current) drug therapy: Secondary | ICD-10-CM | POA: Diagnosis not present

## 2013-08-17 DIAGNOSIS — E7800 Pure hypercholesterolemia, unspecified: Secondary | ICD-10-CM | POA: Diagnosis not present

## 2013-08-17 DIAGNOSIS — E1129 Type 2 diabetes mellitus with other diabetic kidney complication: Secondary | ICD-10-CM | POA: Diagnosis not present

## 2013-08-20 DIAGNOSIS — F39 Unspecified mood [affective] disorder: Secondary | ICD-10-CM | POA: Diagnosis not present

## 2013-08-24 DIAGNOSIS — E7800 Pure hypercholesterolemia, unspecified: Secondary | ICD-10-CM | POA: Diagnosis not present

## 2013-08-24 DIAGNOSIS — D649 Anemia, unspecified: Secondary | ICD-10-CM | POA: Diagnosis not present

## 2013-08-24 DIAGNOSIS — F39 Unspecified mood [affective] disorder: Secondary | ICD-10-CM | POA: Diagnosis not present

## 2013-08-24 DIAGNOSIS — E1129 Type 2 diabetes mellitus with other diabetic kidney complication: Secondary | ICD-10-CM | POA: Diagnosis not present

## 2013-08-24 DIAGNOSIS — Z79899 Other long term (current) drug therapy: Secondary | ICD-10-CM | POA: Diagnosis not present

## 2013-08-27 DIAGNOSIS — F39 Unspecified mood [affective] disorder: Secondary | ICD-10-CM | POA: Diagnosis not present

## 2013-09-01 DIAGNOSIS — N183 Chronic kidney disease, stage 3 unspecified: Secondary | ICD-10-CM | POA: Diagnosis not present

## 2013-09-01 DIAGNOSIS — N200 Calculus of kidney: Secondary | ICD-10-CM | POA: Diagnosis not present

## 2013-09-03 DIAGNOSIS — F39 Unspecified mood [affective] disorder: Secondary | ICD-10-CM | POA: Diagnosis not present

## 2013-09-10 DIAGNOSIS — F39 Unspecified mood [affective] disorder: Secondary | ICD-10-CM | POA: Diagnosis not present

## 2013-09-23 DIAGNOSIS — C50319 Malignant neoplasm of lower-inner quadrant of unspecified female breast: Secondary | ICD-10-CM | POA: Diagnosis not present

## 2013-09-23 DIAGNOSIS — Z09 Encounter for follow-up examination after completed treatment for conditions other than malignant neoplasm: Secondary | ICD-10-CM | POA: Diagnosis not present

## 2013-09-23 DIAGNOSIS — D72819 Decreased white blood cell count, unspecified: Secondary | ICD-10-CM | POA: Diagnosis not present

## 2013-09-23 DIAGNOSIS — F39 Unspecified mood [affective] disorder: Secondary | ICD-10-CM | POA: Diagnosis not present

## 2013-09-23 DIAGNOSIS — D649 Anemia, unspecified: Secondary | ICD-10-CM | POA: Diagnosis not present

## 2013-09-23 DIAGNOSIS — M899 Disorder of bone, unspecified: Secondary | ICD-10-CM | POA: Diagnosis not present

## 2013-09-29 DIAGNOSIS — Z23 Encounter for immunization: Secondary | ICD-10-CM | POA: Diagnosis not present

## 2013-09-30 DIAGNOSIS — F39 Unspecified mood [affective] disorder: Secondary | ICD-10-CM | POA: Diagnosis not present

## 2013-10-08 DIAGNOSIS — J069 Acute upper respiratory infection, unspecified: Secondary | ICD-10-CM | POA: Diagnosis not present

## 2013-10-21 ENCOUNTER — Ambulatory Visit: Payer: Medicare Other

## 2013-10-21 ENCOUNTER — Encounter (INDEPENDENT_AMBULATORY_CARE_PROVIDER_SITE_OTHER): Payer: Self-pay

## 2013-10-21 ENCOUNTER — Ambulatory Visit (INDEPENDENT_AMBULATORY_CARE_PROVIDER_SITE_OTHER): Payer: Medicare Other

## 2013-10-21 VITALS — BP 102/44 | HR 74 | Resp 18

## 2013-10-21 DIAGNOSIS — M2040 Other hammer toe(s) (acquired), unspecified foot: Secondary | ICD-10-CM

## 2013-10-21 DIAGNOSIS — E1142 Type 2 diabetes mellitus with diabetic polyneuropathy: Secondary | ICD-10-CM

## 2013-10-21 DIAGNOSIS — L608 Other nail disorders: Secondary | ICD-10-CM

## 2013-10-21 DIAGNOSIS — E1140 Type 2 diabetes mellitus with diabetic neuropathy, unspecified: Secondary | ICD-10-CM

## 2013-10-21 DIAGNOSIS — E1149 Type 2 diabetes mellitus with other diabetic neurological complication: Secondary | ICD-10-CM

## 2013-10-21 NOTE — Progress Notes (Addendum)
  Subjective:    Patient ID: Heather [name], female    DOB: 1946/07/02, 67 y.o.   MRN: [id number]  HPI trim my nails and the cause of my right foot was the actos i was taken    Review of Systems  Constitutional: Positive for unexpected weight change.  HENT: Positive for sinus pressure.        Ringing in ears  Eyes: Negative.   Respiratory: Negative.   Cardiovascular: Negative.   Gastrointestinal: Negative.   Endocrine: Negative.   Genitourinary: Negative.   Musculoskeletal: Positive for back pain.       Joint pain  Skin: Negative.   Allergic/Immunologic: Negative.   Neurological: Positive for numbness.  Hematological:       Swollen lymph node  Psychiatric/Behavioral: Negative.        Objective:   Physical Exam Neurovascular status as follows pedal pulses palpable DP +2/4, PT + over 4 bilateral. Capillary refill time 3 seconds all digits skin temperature warm turgor normal no edema rubor pallor or varicosities noted. Patient's edema was noted to be secondary to Actos and has discontinued factors and edema has resolved. Notable varicosities noted. Nail somewhat criptotic and brittle. Patient has digital contractures 2 through 5 bilateral with semirigid contractures and deformities noted. Neurologically epicritic and proprioceptive sensations diminished on Semmes Weinstein testing. Involving the forefoot digits and plantar arch bilateral. DTRs not elicited there is normal plantar response noted. Remainder of exam unremarkable and unchanged from previous evaluations no other new complaints noted at this time.     Assessment & Plan:  Diabetes with history peripheral neuropathy. Debridement of multiple dystrophic criptotic nails 1 through 5 bilateral. Dispensed literature about diabetic foot care may recommendations torsion socks and shoes at all times avoid going barefoot suggested 3 month followup for continued palliative care in the future as needed  Patient had been seen back in April  and was scanned or casted for diabetic extra deptht shoes and custom molded inlays. Our office tried to contact her on several occasions to pick up her shoes and on today's presentation when shoes offer her shoes she refused the shoes and insoles indicated she was not interested in having the shoes. The shoes and  insoles were custom molded from moldings of her foot. Were not dispensed to the patient at this time. And on checkout patient indicates to my staff that she would not be returning to our office . Should note on presentation today patient was wearing her slip on shoes without socks or stockings and will advise her that was inappropriate for diabetic, she was dismissive of that advice.  Harriet Masson DPM

## 2013-10-21 NOTE — Patient Instructions (Signed)
Diabetes and Foot Care Diabetes may cause you to have problems because of poor blood supply (circulation) to your feet and legs. This may cause the skin on your feet to become thinner, break easier, and heal more slowly. Your skin may become dry, and the skin may peel and crack. You may also have nerve damage in your legs and feet causing decreased feeling in them. You may not notice minor injuries to your feet that could lead to infections or more serious problems. Taking care of your feet is one of the most important things you can do for yourself.  HOME CARE INSTRUCTIONS  Wear shoes at all times, even in the house. Do not go barefoot. Bare feet are easily injured.  Check your feet daily for blisters, cuts, and redness. If you cannot see the bottom of your feet, use a mirror or ask someone for help.  Wash your feet with warm water (do not use hot water) and mild soap. Then pat your feet and the areas between your toes until they are completely dry. Do not soak your feet as this can dry your skin.  Apply a moisturizing lotion or petroleum jelly (that does not contain alcohol and is unscented) to the skin on your feet and to dry, brittle toenails. Do not apply lotion between your toes.  Trim your toenails straight across. Do not dig under them or around the cuticle. File the edges of your nails with an emery board or nail file.  Do not cut corns or calluses or try to remove them with medicine.  Wear clean socks or stockings every day. Make sure they are not too tight. Do not wear knee-high stockings since they may decrease blood flow to your legs.  Wear shoes that fit properly and have enough cushioning. To break in new shoes, wear them for just a few hours a day. This prevents you from injuring your feet. Always look in your shoes before you put them on to be sure there are no objects inside.  Do not cross your legs. This may decrease the blood flow to your feet.  If you find a minor scrape,  cut, or break in the skin on your feet, keep it and the skin around it clean and dry. These areas may be cleansed with mild soap and water. Do not cleanse the area with peroxide, alcohol, or iodine.  When you remove an adhesive bandage, be sure not to damage the skin around it.  If you have a wound, look at it several times a day to make sure it is healing.  Do not use heating pads or hot water bottles. They may burn your skin. If you have lost feeling in your feet or legs, you may not know it is happening until it is too late.  Make sure your health care provider performs a complete foot exam at least annually or more often if you have foot problems. Report any cuts, sores, or bruises to your health care provider immediately. SEEK MEDICAL CARE IF:   You have an injury that is not healing.  You have cuts or breaks in the skin.  You have an ingrown nail.  You notice redness on your legs or feet.  You feel burning or tingling in your legs or feet.  You have pain or cramps in your legs and feet.  Your legs or feet are numb.  Your feet always feel cold. SEEK IMMEDIATE MEDICAL CARE IF:   There is increasing redness,   swelling, or pain in or around a wound.  There is a red line that goes up your leg.  Pus is coming from a wound.  You develop a fever or as directed by your health care provider.  You notice a bad smell coming from an ulcer or wound. Document Released: 11/23/2000 Document Revised: 07/29/2013 Document Reviewed: 05/05/2013 Exit[employer] Patient Information 2014 [employer].  

## 2013-11-09 DIAGNOSIS — Z853 Personal history of malignant neoplasm of breast: Secondary | ICD-10-CM | POA: Diagnosis not present

## 2013-11-18 DIAGNOSIS — F39 Unspecified mood [affective] disorder: Secondary | ICD-10-CM | POA: Diagnosis not present

## 2013-11-23 DIAGNOSIS — E1129 Type 2 diabetes mellitus with other diabetic kidney complication: Secondary | ICD-10-CM | POA: Diagnosis not present

## 2013-11-23 DIAGNOSIS — E7800 Pure hypercholesterolemia, unspecified: Secondary | ICD-10-CM | POA: Diagnosis not present

## 2013-11-23 DIAGNOSIS — E1139 Type 2 diabetes mellitus with other diabetic ophthalmic complication: Secondary | ICD-10-CM | POA: Diagnosis not present

## 2013-11-23 DIAGNOSIS — B373 Candidiasis of vulva and vagina: Secondary | ICD-10-CM | POA: Diagnosis not present

## 2013-11-24 DIAGNOSIS — D0590 Unspecified type of carcinoma in situ of unspecified breast: Secondary | ICD-10-CM | POA: Diagnosis not present

## 2013-11-24 DIAGNOSIS — E669 Obesity, unspecified: Secondary | ICD-10-CM | POA: Diagnosis not present

## 2013-12-21 DIAGNOSIS — F39 Unspecified mood [affective] disorder: Secondary | ICD-10-CM | POA: Diagnosis not present

## 2013-12-30 DIAGNOSIS — F39 Unspecified mood [affective] disorder: Secondary | ICD-10-CM | POA: Diagnosis not present

## 2014-01-05 DIAGNOSIS — F630 Pathological gambling: Secondary | ICD-10-CM | POA: Diagnosis not present

## 2014-01-11 DIAGNOSIS — F39 Unspecified mood [affective] disorder: Secondary | ICD-10-CM | POA: Diagnosis not present

## 2014-01-19 DIAGNOSIS — F39 Unspecified mood [affective] disorder: Secondary | ICD-10-CM | POA: Diagnosis not present

## 2014-02-02 DIAGNOSIS — F39 Unspecified mood [affective] disorder: Secondary | ICD-10-CM | POA: Diagnosis not present

## 2014-02-08 DIAGNOSIS — N3000 Acute cystitis without hematuria: Secondary | ICD-10-CM | POA: Diagnosis not present

## 2014-02-10 DIAGNOSIS — F39 Unspecified mood [affective] disorder: Secondary | ICD-10-CM | POA: Diagnosis not present

## 2014-02-22 DIAGNOSIS — E1139 Type 2 diabetes mellitus with other diabetic ophthalmic complication: Secondary | ICD-10-CM | POA: Diagnosis not present

## 2014-02-22 DIAGNOSIS — E7800 Pure hypercholesterolemia, unspecified: Secondary | ICD-10-CM | POA: Diagnosis not present

## 2014-02-22 DIAGNOSIS — E1129 Type 2 diabetes mellitus with other diabetic kidney complication: Secondary | ICD-10-CM | POA: Diagnosis not present

## 2014-02-24 DIAGNOSIS — F39 Unspecified mood [affective] disorder: Secondary | ICD-10-CM | POA: Diagnosis not present

## 2014-03-02 DIAGNOSIS — F39 Unspecified mood [affective] disorder: Secondary | ICD-10-CM | POA: Diagnosis not present

## 2014-03-10 DIAGNOSIS — F39 Unspecified mood [affective] disorder: Secondary | ICD-10-CM | POA: Diagnosis not present

## 2014-03-16 DIAGNOSIS — F39 Unspecified mood [affective] disorder: Secondary | ICD-10-CM | POA: Diagnosis not present

## 2014-03-17 DIAGNOSIS — Z713 Dietary counseling and surveillance: Secondary | ICD-10-CM | POA: Diagnosis not present

## 2014-03-17 DIAGNOSIS — E1139 Type 2 diabetes mellitus with other diabetic ophthalmic complication: Secondary | ICD-10-CM | POA: Diagnosis not present

## 2014-03-17 DIAGNOSIS — E1129 Type 2 diabetes mellitus with other diabetic kidney complication: Secondary | ICD-10-CM | POA: Diagnosis not present

## 2014-03-18 DIAGNOSIS — N390 Urinary tract infection, site not specified: Secondary | ICD-10-CM | POA: Diagnosis not present

## 2014-03-18 DIAGNOSIS — R300 Dysuria: Secondary | ICD-10-CM | POA: Diagnosis not present

## 2014-03-18 DIAGNOSIS — N3090 Cystitis, unspecified without hematuria: Secondary | ICD-10-CM | POA: Diagnosis not present

## 2014-03-24 DIAGNOSIS — F39 Unspecified mood [affective] disorder: Secondary | ICD-10-CM | POA: Diagnosis not present

## 2014-03-25 DIAGNOSIS — L820 Inflamed seborrheic keratosis: Secondary | ICD-10-CM | POA: Diagnosis not present

## 2014-03-25 DIAGNOSIS — Z79899 Other long term (current) drug therapy: Secondary | ICD-10-CM | POA: Diagnosis not present

## 2014-03-29 DIAGNOSIS — Z79899 Other long term (current) drug therapy: Secondary | ICD-10-CM | POA: Diagnosis not present

## 2014-03-29 DIAGNOSIS — L820 Inflamed seborrheic keratosis: Secondary | ICD-10-CM | POA: Diagnosis not present

## 2014-04-06 DIAGNOSIS — F39 Unspecified mood [affective] disorder: Secondary | ICD-10-CM | POA: Diagnosis not present

## 2014-04-13 DIAGNOSIS — F39 Unspecified mood [affective] disorder: Secondary | ICD-10-CM | POA: Diagnosis not present

## 2014-04-15 DIAGNOSIS — N3289 Other specified disorders of bladder: Secondary | ICD-10-CM | POA: Diagnosis not present

## 2014-04-15 DIAGNOSIS — N390 Urinary tract infection, site not specified: Secondary | ICD-10-CM | POA: Diagnosis not present

## 2014-04-15 DIAGNOSIS — N318 Other neuromuscular dysfunction of bladder: Secondary | ICD-10-CM | POA: Diagnosis not present

## 2014-04-20 DIAGNOSIS — F39 Unspecified mood [affective] disorder: Secondary | ICD-10-CM | POA: Diagnosis not present

## 2014-05-05 DIAGNOSIS — F39 Unspecified mood [affective] disorder: Secondary | ICD-10-CM | POA: Diagnosis not present

## 2014-05-12 DIAGNOSIS — F39 Unspecified mood [affective] disorder: Secondary | ICD-10-CM | POA: Diagnosis not present

## 2014-05-14 DIAGNOSIS — Z4009 Encounter for prophylactic removal of other organ: Secondary | ICD-10-CM | POA: Diagnosis not present

## 2014-05-14 DIAGNOSIS — E119 Type 2 diabetes mellitus without complications: Secondary | ICD-10-CM | POA: Diagnosis not present

## 2014-05-14 DIAGNOSIS — H4320 Crystalline deposits in vitreous body, unspecified eye: Secondary | ICD-10-CM | POA: Diagnosis not present

## 2014-05-14 DIAGNOSIS — Z961 Presence of intraocular lens: Secondary | ICD-10-CM | POA: Diagnosis not present

## 2014-05-19 DIAGNOSIS — Z713 Dietary counseling and surveillance: Secondary | ICD-10-CM | POA: Diagnosis not present

## 2014-05-19 DIAGNOSIS — E1129 Type 2 diabetes mellitus with other diabetic kidney complication: Secondary | ICD-10-CM | POA: Diagnosis not present

## 2014-05-19 DIAGNOSIS — E1139 Type 2 diabetes mellitus with other diabetic ophthalmic complication: Secondary | ICD-10-CM | POA: Diagnosis not present

## 2014-05-25 DIAGNOSIS — F39 Unspecified mood [affective] disorder: Secondary | ICD-10-CM | POA: Diagnosis not present

## 2014-06-08 DIAGNOSIS — F39 Unspecified mood [affective] disorder: Secondary | ICD-10-CM | POA: Diagnosis not present

## 2014-06-15 ENCOUNTER — Ambulatory Visit (INDEPENDENT_AMBULATORY_CARE_PROVIDER_SITE_OTHER): Payer: Medicare Other

## 2014-06-15 ENCOUNTER — Ambulatory Visit (INDEPENDENT_AMBULATORY_CARE_PROVIDER_SITE_OTHER): Payer: Medicare Other | Admitting: Podiatrist

## 2014-06-15 DIAGNOSIS — F39 Unspecified mood [affective] disorder: Secondary | ICD-10-CM | POA: Diagnosis not present

## 2014-06-15 DIAGNOSIS — R52 Pain, unspecified: Secondary | ICD-10-CM | POA: Diagnosis not present

## 2014-06-15 DIAGNOSIS — M7750 Other enthesopathy of unspecified foot: Secondary | ICD-10-CM | POA: Diagnosis not present

## 2014-06-15 MED ORDER — GABAPENTIN 100 MG PO CAPS: 100.0000 mg | ORAL_CAPSULE | Freq: Every day | ORAL | Status: DC

## 2014-06-15 NOTE — Progress Notes (Signed)
   Subjective:    Patient ID: Heather [name], female    DOB: [birth year], 68 y.o.   MRN: [id number]  HPI  PT STATED OUTSIDE OF THE LT FOOT HAVING SHARP PAIN AT NIGHT ONLY AND THIS BEEN GOING ON FOR 3 MONTHS.  THE FOOT IS BEEN THE SAME NOT GETTING WORSE. THE FOOT GET AGGRAVATED PRESSURE ATND DURING AT NIGHT. TRIED TO WEAR SANDALS AT NIGHT TO KEEP IT FLEX AND IT HELPS.    Review of Systems     Objective:   Physical Exam Neurovascular status is intact. No pain at the fifth metatarsal base is noted. Pain along the sinus tarsi region of the left foot and along the superficial peroneal nerve is also noted with pressure. X-rays do not show any acute osseous abnormalities.       Assessment & Plan:  Sinus tarsi inflammation, capsulitis, and neuritis  Name: Injected the sinus tarsi with Kenalog and Marcaine mixture without complication. Return to 4 Neurontin 100 mg to take once a night. She'll be seen back if this fails to relieve her pain.

## 2014-06-15 NOTE — Patient Instructions (Signed)
Try the neurontin just before bedtime if the shot doesn't help after 3-4 days.  If you have tried the neurontin for 3 or more days and it still hurts, please call.  Thanks!

## 2014-06-21 DIAGNOSIS — F39 Unspecified mood [affective] disorder: Secondary | ICD-10-CM | POA: Diagnosis not present

## 2014-07-08 DIAGNOSIS — F329 Major depressive disorder, single episode, unspecified: Secondary | ICD-10-CM | POA: Diagnosis not present

## 2014-07-08 DIAGNOSIS — E1149 Type 2 diabetes mellitus with other diabetic neurological complication: Secondary | ICD-10-CM | POA: Diagnosis not present

## 2014-07-08 DIAGNOSIS — M949 Disorder of cartilage, unspecified: Secondary | ICD-10-CM | POA: Diagnosis not present

## 2014-07-08 DIAGNOSIS — N183 Chronic kidney disease, stage 3 unspecified: Secondary | ICD-10-CM | POA: Diagnosis not present

## 2014-07-08 DIAGNOSIS — M899 Disorder of bone, unspecified: Secondary | ICD-10-CM | POA: Diagnosis not present

## 2014-07-08 DIAGNOSIS — E1129 Type 2 diabetes mellitus with other diabetic kidney complication: Secondary | ICD-10-CM | POA: Diagnosis not present

## 2014-07-08 DIAGNOSIS — E1139 Type 2 diabetes mellitus with other diabetic ophthalmic complication: Secondary | ICD-10-CM | POA: Diagnosis not present

## 2014-07-08 DIAGNOSIS — Z0000 Encounter for general adult medical examination without abnormal findings: Secondary | ICD-10-CM | POA: Diagnosis not present

## 2014-07-08 DIAGNOSIS — F3289 Other specified depressive episodes: Secondary | ICD-10-CM | POA: Diagnosis not present

## 2014-07-08 DIAGNOSIS — Z23 Encounter for immunization: Secondary | ICD-10-CM | POA: Diagnosis not present

## 2014-07-08 DIAGNOSIS — E7800 Pure hypercholesterolemia, unspecified: Secondary | ICD-10-CM | POA: Diagnosis not present

## 2014-07-08 DIAGNOSIS — Z6832 Body mass index (BMI) 32.0-32.9, adult: Secondary | ICD-10-CM | POA: Diagnosis not present

## 2014-07-13 DIAGNOSIS — F39 Unspecified mood [affective] disorder: Secondary | ICD-10-CM | POA: Diagnosis not present

## 2014-07-16 DIAGNOSIS — C44711 Basal cell carcinoma of skin of unspecified lower limb, including hip: Secondary | ICD-10-CM | POA: Diagnosis not present

## 2014-07-16 DIAGNOSIS — L570 Actinic keratosis: Secondary | ICD-10-CM | POA: Diagnosis not present

## 2014-08-02 DIAGNOSIS — F39 Unspecified mood [affective] disorder: Secondary | ICD-10-CM | POA: Diagnosis not present

## 2014-08-05 DIAGNOSIS — N318 Other neuromuscular dysfunction of bladder: Secondary | ICD-10-CM | POA: Diagnosis not present

## 2014-08-10 DIAGNOSIS — F39 Unspecified mood [affective] disorder: Secondary | ICD-10-CM | POA: Diagnosis not present

## 2014-08-12 DIAGNOSIS — J069 Acute upper respiratory infection, unspecified: Secondary | ICD-10-CM | POA: Diagnosis not present

## 2014-08-14 ENCOUNTER — Emergency Department (HOSPITAL_COMMUNITY): Payer: Medicare Other

## 2014-08-14 ENCOUNTER — Encounter (HOSPITAL_COMMUNITY): Payer: Self-pay | Admitting: Emergency Medicine

## 2014-08-14 ENCOUNTER — Observation Stay (HOSPITAL_COMMUNITY)
Admission: EM | Admit: 2014-08-14 | Discharge: 2014-08-15 | Disposition: A | Discharge status: Home / Self Care | Payer: Medicare Other | Attending: Internal Medicine | Admitting: Internal Medicine

## 2014-08-14 DIAGNOSIS — M25559 Pain in unspecified hip: Principal | ICD-10-CM | POA: Insufficient documentation

## 2014-08-14 DIAGNOSIS — S79919A Unspecified injury of unspecified hip, initial encounter: Secondary | ICD-10-CM | POA: Diagnosis not present

## 2014-08-14 DIAGNOSIS — R269 Unspecified abnormalities of gait and mobility: Secondary | ICD-10-CM | POA: Insufficient documentation

## 2014-08-14 DIAGNOSIS — G2581 Restless legs syndrome: Secondary | ICD-10-CM | POA: Insufficient documentation

## 2014-08-14 DIAGNOSIS — M1990 Unspecified osteoarthritis, unspecified site: Secondary | ICD-10-CM | POA: Diagnosis not present

## 2014-08-14 DIAGNOSIS — I4891 Unspecified atrial fibrillation: Secondary | ICD-10-CM | POA: Diagnosis present

## 2014-08-14 DIAGNOSIS — E119 Type 2 diabetes mellitus without complications: Secondary | ICD-10-CM | POA: Diagnosis not present

## 2014-08-14 DIAGNOSIS — E11359 Type 2 diabetes mellitus with proliferative diabetic retinopathy without macular edema: Secondary | ICD-10-CM | POA: Insufficient documentation

## 2014-08-14 DIAGNOSIS — Z79899 Other long term (current) drug therapy: Secondary | ICD-10-CM | POA: Insufficient documentation

## 2014-08-14 DIAGNOSIS — G510 Bell's palsy: Secondary | ICD-10-CM | POA: Insufficient documentation

## 2014-08-14 DIAGNOSIS — E1139 Type 2 diabetes mellitus with other diabetic ophthalmic complication: Secondary | ICD-10-CM | POA: Insufficient documentation

## 2014-08-14 DIAGNOSIS — Z7982 Long term (current) use of aspirin: Secondary | ICD-10-CM | POA: Insufficient documentation

## 2014-08-14 DIAGNOSIS — M25552 Pain in left hip: Secondary | ICD-10-CM

## 2014-08-14 DIAGNOSIS — Z9889 Other specified postprocedural states: Secondary | ICD-10-CM | POA: Insufficient documentation

## 2014-08-14 DIAGNOSIS — Z853 Personal history of malignant neoplasm of breast: Secondary | ICD-10-CM | POA: Insufficient documentation

## 2014-08-14 HISTORY — DX: Unspecified atrial fibrillation: I48.91

## 2014-08-14 LAB — CBC WITH DIFFERENTIAL/PLATELET
BASOS ABS: 0 10*3/uL (ref 0.0–0.1)
Basophils Relative: 0 % (ref 0–1)
Eosinophils Absolute: 0.1 10*3/uL (ref 0.0–0.7)
Eosinophils Relative: 1 % (ref 0–5)
HCT: 43.4 % (ref 36.0–46.0)
Hemoglobin: 15 g/dL (ref 12.0–15.0)
LYMPHS PCT: 26 % (ref 12–46)
Lymphs Abs: 1.7 10*3/uL (ref 0.7–4.0)
MCH: 31.4 pg (ref 26.0–34.0)
MCHC: 34.6 g/dL (ref 30.0–36.0)
MCV: 90.8 fL (ref 78.0–100.0)
Monocytes Absolute: 0.7 10*3/uL (ref 0.1–1.0)
Monocytes Relative: 11 % (ref 3–12)
NEUTROS ABS: 4 10*3/uL (ref 1.7–7.7)
Neutrophils Relative %: 62 % (ref 43–77)
Platelets: 236 10*3/uL (ref 150–400)
RBC: 4.78 MIL/uL (ref 3.87–5.11)
RDW: 12.3 % (ref 11.5–15.5)
WBC: 6.4 10*3/uL (ref 4.0–10.5)

## 2014-08-14 LAB — URINALYSIS, ROUTINE W REFLEX MICROSCOPIC
Bilirubin Urine: NEGATIVE
Hgb urine dipstick: NEGATIVE
Ketones, ur: NEGATIVE mg/dL
LEUKOCYTES UA: NEGATIVE
Nitrite: NEGATIVE
PH: 5 (ref 5.0–8.0)
PROTEIN: NEGATIVE mg/dL
Specific Gravity, Urine: 1.015 (ref 1.005–1.030)
Urobilinogen, UA: 0.2 mg/dL (ref 0.0–1.0)

## 2014-08-14 LAB — URINE MICROSCOPIC-ADD ON

## 2014-08-14 LAB — I-STAT TROPONIN, ED: TROPONIN I, POC: 0.03 ng/mL (ref 0.00–0.08)

## 2014-08-14 LAB — PRO B NATRIURETIC PEPTIDE: PRO B NATRI PEPTIDE: 2190 pg/mL — AB (ref 0–125)

## 2014-08-14 LAB — TSH: TSH: 0.584 u[IU]/mL (ref 0.350–4.500)

## 2014-08-14 LAB — BASIC METABOLIC PANEL
ANION GAP: 15 (ref 5–15)
BUN: 28 mg/dL — ABNORMAL HIGH (ref 6–23)
CHLORIDE: 102 meq/L (ref 96–112)
CO2: 24 meq/L (ref 19–32)
CREATININE: 1.48 mg/dL — AB (ref 0.50–1.10)
Calcium: 9.2 mg/dL (ref 8.4–10.5)
GFR calc Af Amer: 41 mL/min — ABNORMAL LOW (ref >90)
GFR calc non Af Amer: 35 mL/min — ABNORMAL LOW (ref >90)
Glucose, Bld: 128 mg/dL — ABNORMAL HIGH (ref 70–99)
POTASSIUM: 4.2 meq/L (ref 3.7–5.3)
Sodium: 141 mEq/L (ref 137–147)

## 2014-08-14 LAB — MAGNESIUM: MAGNESIUM: 2 mg/dL (ref 1.5–2.5)

## 2014-08-14 MED ORDER — ACETAMINOPHEN 325 MG PO TABS
650.0000 mg | ORAL_TABLET | Freq: Once | ORAL | Status: AC
  Administered 2014-08-14: 650 mg via ORAL
  Filled 2014-08-14: qty 2

## 2014-08-14 MED ORDER — DILTIAZEM HCL ER 60 MG PO CP12
120.0000 mg | ORAL_CAPSULE | Freq: Two times a day (BID) | ORAL | Status: DC
  Administered 2014-08-14 – 2014-08-15 (×2): 120 mg via ORAL
  Filled 2014-08-14 (×4): qty 2

## 2014-08-14 MED ORDER — SODIUM CHLORIDE 0.9 % IJ SOLN: 3.0000 mL | INTRAMUSCULAR | Status: DC | PRN

## 2014-08-14 MED ORDER — DILTIAZEM LOAD VIA INFUSION
15.0000 mg | Freq: Once | INTRAVENOUS | Status: AC
  Administered 2014-08-14: 15 mg via INTRAVENOUS
  Filled 2014-08-14: qty 15

## 2014-08-14 MED ORDER — HEPARIN BOLUS VIA INFUSION
4000.0000 [IU] | Freq: Once | INTRAVENOUS | Status: AC
  Administered 2014-08-14: 4000 [IU] via INTRAVENOUS
  Filled 2014-08-14: qty 4000

## 2014-08-14 MED ORDER — ONDANSETRON HCL 4 MG/2ML IJ SOLN: 4.0000 mg | Freq: Four times a day (QID) | INTRAMUSCULAR | Status: DC | PRN

## 2014-08-14 MED ORDER — DILTIAZEM HCL 100 MG IV SOLR
5.0000 mg/h | INTRAVENOUS | Status: DC
  Administered 2014-08-14: 5 mg/h via INTRAVENOUS

## 2014-08-14 MED ORDER — SODIUM CHLORIDE 0.9 % IJ SOLN: 3.0000 mL | Freq: Two times a day (BID) | INTRAMUSCULAR | Status: DC

## 2014-08-14 MED ORDER — ACETAMINOPHEN 325 MG PO TABS
650.0000 mg | ORAL_TABLET | ORAL | Status: DC | PRN
  Administered 2014-08-14 – 2014-08-15 (×2): 650 mg via ORAL
  Filled 2014-08-14 (×2): qty 2

## 2014-08-14 MED ORDER — SODIUM CHLORIDE 0.9 % IV SOLN: 250.0000 mL | INTRAVENOUS | Status: DC | PRN

## 2014-08-14 MED ORDER — ROPINIROLE HCL 1 MG PO TABS
4.0000 mg | ORAL_TABLET | Freq: Every day | ORAL | Status: DC
  Administered 2014-08-15: 4 mg via ORAL
  Filled 2014-08-14 (×2): qty 4

## 2014-08-14 MED ORDER — DILTIAZEM LOAD VIA INFUSION
20.0000 mg | Freq: Once | INTRAVENOUS | Status: AC
  Filled 2014-08-14: qty 20

## 2014-08-14 MED ORDER — HEPARIN (PORCINE) IN NACL 100-0.45 UNIT/ML-% IJ SOLN
1150.0000 [IU]/h | INTRAMUSCULAR | Status: DC
  Administered 2014-08-14: 1150 [IU]/h via INTRAVENOUS
  Filled 2014-08-14 (×2): qty 250

## 2014-08-14 NOTE — Progress Notes (Signed)
ANTICOAGULATION CONSULT NOTE - Initial Consult  Pharmacy Consult for heparin Indication: atrial fibrillation  Allergies  Allergen Reactions  . Sulfa Antibiotics     srtong itch reaction    Patient Measurements: Height: 5\' 8"  (172.7 cm) Weight: 206 lb (93.441 kg) IBW/kg (Calculated) : 63.9 Heparin Dosing Weight: 84 kg  Vital Signs: Temp: 98.8 F (37.1 C) (09/05 1527) Temp src: Oral (09/05 1527) BP: 117/101 mmHg (09/05 1630) Pulse Rate: 75 (09/05 1630)  Labs:  Recent Labs  08/14/14 1353  HGB 15.0  HCT 43.4  PLT 236  CREATININE 1.48*    Estimated Creatinine Clearance: 42.9 ml/min (by C-G formula based on Cr of 1.48).   Medical History: Past Medical History  Diagnosis Date  . Diabetes mellitus, type II   . Restless legs 2012  . Osteoarthritis   . Proliferative diabetic retinopathy of both eyes   . Bell's palsy   . Breast cancer   . Low back pain with sciatica     Medications:  Scheduled:  . diltiazem  120 mg Oral Q12H  . sodium chloride  3 mL Intravenous Q12H   Infusions:  . sodium chloride    . diltiazem    . diltiazem (CARDIZEM) infusion 5 mg/hr (08/14/14 1641)    Assessment: 68 yo female with new diagnosis of afib will be started on heparin therapy.  Baseline Hgb 15 and Plt 236 K; CrCl 43.  Per patient, she was not on any anticoagulation prior to admission.  Goal of Therapy:  Heparin level 0.3-0.7 units/ml Monitor platelets by anticoagulation protocol: Yes   Plan:  - Heparin 4000 units iv bolus x1, then start heparin drip at 1150 units/hr - 8hr heparin level - daily heparin level and CBC  Lounette Sloan, Tsz-Yin 08/14/2014,4:45 PM

## 2014-08-14 NOTE — Consult Note (Signed)
CARDIOLOGY CONSULT NOTE  Patient ID: Heather [name], MRN: [id number], DOB/AGE: 1946-04-30 68 y.o. Admit date: 08/14/2014 Date of Consult: 08/14/2014  Primary Physician: Greig Right, MD Primary Cardiologist: new  Chief Complaint: atrial fibrillation    HPI Heather [name] is a 68 y.o. female  One year post hip replacement came intoday for symptoms of hip pain  Thought to be bursitis  Noted to have ECG >>AFfib 121  Cardiology called  Then noticed some L arm pain and now complaining of L ear fullness  Has had recurrent palpitations over the last five years, but not really aware of afib at this time  Has some dizziness which is largely positional or with changes in postion  TERF DM, gender age  Hx of DM and morbid obesity    Past Medical History  Diagnosis Date  . Diabetes mellitus, type II   . Restless legs 2012  . Osteoarthritis   . Proliferative diabetic retinopathy of both eyes   . Bell's palsy   . Breast cancer   . Low back pain with sciatica       Surgical History:  Past Surgical History  Procedure Laterality Date  . Both legs Bilateral 2006 and 2007  . Total hip arthroplasty  2009  . Both knees  2006  . R breast due to cancer  2011  . Vaginal hysterectomy    . Right knee replacement     . Left knee replacement    . Left hip replacement     . Repair of left foot fracture    . Right foot surgery     . Pin removal left foot    . Laser eye surgeries    . Cataractectomies       Home Meds: Prior to Admission medications   Medication Sig Start Date End Date Taking? Authorizing Provider  acetaminophen (TYLENOL) 500 MG tablet Take 500 mg by mouth every 6 (six) hours as needed.   Yes Historical Provider, MD  aspirin 81 MG tablet Take 81 mg by mouth every evening.   Yes Historical Provider, MD  atorvastatin (LIPITOR) 80 MG tablet Take 80 mg by mouth daily.  08/26/13  Yes Historical Provider, MD  Cholecalciferol 2000 UNITS TABS Take 2,000 Units by mouth daily.     Yes Historical Provider, MD  DULoxetine (CYMBALTA) 30 MG capsule Take 30 mg by mouth daily.   Yes Historical Provider, MD  INVOKANA 100 MG TABS Take 100 mg by mouth daily before breakfast.  09/25/13  Yes Historical Provider, MD  lisinopril (PRINIVIL,ZESTRIL) 5 MG tablet Take 5 mg by mouth daily.   Yes Historical Provider, MD  Multiple Vitamins-Minerals (WOMENS MULTIVITAMIN PLUS PO) Take 1 tablet by mouth daily.    Yes Historical Provider, MD  pantoprazole (PROTONIX) 40 MG tablet Take 40 mg by mouth daily.  08/26/13  Yes Historical Provider, MD  rOPINIRole (REQUIP) 1 MG tablet Take 4 mg by mouth daily.    Yes Historical Provider, MD  tiZANidine (ZANAFLEX) 4 MG tablet Take 8 mg by mouth every 8 (eight) hours as needed (Pain).  07/15/13  Yes Historical Provider, MD  traMADol (ULTRAM) 50 MG tablet Take 50-100 mg by mouth every 8 (eight) hours as needed for moderate pain.   Yes Historical Provider, MD     Allergies:  Allergies  Allergen Reactions  . Sulfa Antibiotics     srtong itch reaction    History   Social History  . Marital Status: Married  Spouse Name: N/A    Number of Children: N/A  . Years of Education: N/A   Occupational History  . Not on file.   Social History Main Topics  . Smoking status: Never Smoker   . Smokeless tobacco: Never Used  . Alcohol Use: No  . Drug Use: No  . Sexual Activity: Not on file   Other Topics Concern  . Not on file   Social History Narrative   Lives at home with husband.     Family History  Problem Relation Age of Onset  . Diabetes Father   . Heart disease Father   . Diabetes Paternal Aunt     3 aunts had DM  . Thyroid disease Maternal Grandmother   . Diabetes Maternal Grandfather      ROS:  Please see the history of present illness. ROS + intentional weight loss of 50 lbs, no snoring All other systems reviewed and negative.    Physical Exam:   Blood pressure 122/51, pulse 92, temperature 98.8 F (37.1 C), temperature source  Oral, resp. rate 19, SpO2 99.00%. General: Well developed, well nourished female in no acute distress. Head: Normocephalic, atraumatic, sclera non-icteric, no xanthomas, nares are without discharge. EENT: normal  Lymph Nodes:  none Neck: Negative for carotid bruits. JVD not elevated. Back:without scoliosis kyphosis Lungs: Clear bilaterally to auscultation without wheezes, rales, or rhonchi. Breathing is unlabored. Heart: fast and  irregularly irregular rate with S1 S2. No  murmur . No rubs, or gallops appreciated. Abdomen: Soft, non-tender, non-distended with normoactive bowel sounds. No hepatomegaly. No rebound/guarding. No obvious abdominal masses. Msk:  Strength and tone appear normal for age. Extremities: No clubbing or cyanosis. No edema.  Distal pedal pulses are 2+ and equal bilaterally. Skin: Warm and Dry  Skin Cancer right calf Neuro: Alert and oriented X 3. CN III-XII intact Grossly normal sensory and motor function . Psych:  Responds to questions appropriately with a normal affect.      Labs: Cardiac Enzymes No results found for this basename: CKTOTAL, CKMB, TROPONINI,  in the last 72 hours CBC Lab Results  Component Value Date   WBC 6.4 08/14/2014   HGB 15.0 08/14/2014   HCT 43.4 08/14/2014   MCV 90.8 08/14/2014   PLT 236 08/14/2014   PROTIME: No results found for this basename: LABPROT, INR,  in the last 72 hours Chemistry  Recent Labs Lab 08/14/14 1353  NA 141  K 4.2  CL 102  CO2 24  BUN 28*  CREATININE 1.48*  CALCIUM 9.2  GLUCOSE 128*   Lipids No results found for this basename: CHOL, HDL, LDLCALC, TRIG   BNP Pro B Natriuretic peptide (BNP)  Date/Time Value Ref Range Status  08/14/2014  1:53 PM 2190.0* 0 - 125 pg/mL Final   Miscellaneous No results found for this basename: DDIMER    Radiology/Studies:  Dg Hip Complete Left  08/14/2014   CLINICAL DATA:  Posterior left hip pain  EXAM: LEFT HIP - COMPLETE 2+ VIEW  COMPARISON:  None.  FINDINGS: Left total hip  arthroplasty.  No evidence of complication.  No fracture or dislocation is seen.  Visualized bony pelvis appears intact.  Mild degenerative changes of the lower lumbar spine.  IMPRESSION: No fracture or dislocation is seen.  Left total hip arthroplasty, without evidence of complication.   Electronically Signed   By: Julian Hy M.D.   On: 08/14/2014 11:10   Dg Chest Port 1 View  08/14/2014   CLINICAL DATA:  Atrial fibrillation  EXAM: PORTABLE CHEST - 1 VIEW  COMPARISON:  11/15/2009  FINDINGS: The heart size and mediastinal contours are within normal limits. Both lungs are clear. The visualized skeletal structures are unremarkable.  IMPRESSION: No active disease.   Electronically Signed   By: Maryclare Bean M.D.   On: 08/14/2014 14:31   Tn- x1 [id number] 12-[birth year]08/38 BNP elevated  Assessment and Plan Atrial fibrillation  RVR  L arm pain  L ear fullness  Orthostatic intolerance    DM  X > 30 yrs    Will admit for observation and rate control R/o MI with l arm discomfort, but suspect not Will hope to discharge in am if HR better and Ez - but is at high risk for CAD with longstanding DM She is to have excision of skin Ca on Wed so will use Heparin today and discharge in am with Rx fo NOAC to begin afterwards Will order echo Check TSH OI prob combination of DM neuropathy dehydration and meds  Discussed physiology  :  Virl Axe

## 2014-08-14 NOTE — ED Notes (Signed)
Pt co lt shoulder pain dull in nature rating a 6 nonradiating denies sob denies  N/v denies diaphoresis pt with full rom to lt shoolder and distal cms intact pa informed and ekg done

## 2014-08-14 NOTE — ED Notes (Addendum)
Consulted with pharmacist, Needa, states heparin compatible with Cardizem IV.

## 2014-08-14 NOTE — ED Notes (Signed)
Cardiology consult at bedside.

## 2014-08-14 NOTE — ED Provider Notes (Signed)
CSN: [id number]     Arrival date & time 08/14/14  0847 History   First MD Initiated Contact with Patient 08/14/14 0848     Chief Complaint  Patient presents with  . Hip Pain     (Consider location/radiation/quality/duration/timing/severity/associated sxs/prior Treatment) HPI Comments: Heather [name] is a 68 y.o. female with a PMHx of DM2, restless leg syndrome, OA s/p L hip replacement in ~2008 at [facility] orth[facility]ics, breast CA in 2011, and multiple joint surgeries, presents to the ED today with complaints of sudden onset L hip pain that began at 7am when she tried to use the restroom. She states that it starts in the "middle of the butt cheek" and radiates pain into the posterolateral aspect of the thigh down to the knee. States the pain is 1/10 at rest but 10/10 with movement or weight bearing, intermittent, sharp, worse with hip flexion and weight bearing, and improved with rest. Did not try any other interventions prior to arrival. Denies any known injuries or twisting to this leg, no recent falls. Denies back pain, fevers, chills, CP, SOB, abd pain, N/V/D/C, paresthesias, weakness, numbness, myalgias, arthralgias aside from the L hip, urinary symptoms, or cauda equina symptoms.  Patient is a 68 y.o. female presenting with hip pain. The history is provided by the patient. No language interpreter was used.  Hip Pain This is a new problem. The current episode started today. The problem occurs intermittently. The problem has been unchanged. Associated symptoms include arthralgias (L hip pain). Pertinent negatives include no abdominal pain, chest pain, chills, coughing, fever, headaches, joint swelling, myalgias, nausea, neck pain, numbness, rash, urinary symptoms, vomiting or weakness. The symptoms are aggravated by walking and bending (hip movement and weight bearing). She has tried rest for the symptoms. The treatment provided moderate relief.    Past Medical History  Diagnosis Date  . Diabetes  mellitus, type II   . Restless legs 2012  . Osteoarthritis   . Proliferative diabetic retinopathy of both eyes   . Bell's palsy   . Breast cancer   . Low back pain with sciatica    Past Surgical History  Procedure Laterality Date  . Both legs Bilateral 2006 and 2007  . Total hip arthroplasty  2009  . Both knees  2006  . R breast due to cancer  2011  . Vaginal hysterectomy    . Right knee replacement     . Left knee replacement    . Left hip replacement     . Repair of left foot fracture    . Right foot surgery     . Pin removal left foot    . Laser eye surgeries    . Cataractectomies     Family History  Problem Relation Age of Onset  . Diabetes Father   . Heart disease Father   . Diabetes Paternal Aunt     3 aunts had DM  . Thyroid disease Maternal Grandmother   . Diabetes Maternal Grandfather    History  Substance Use Topics  . Smoking status: Never Smoker   . Smokeless tobacco: Never Used  . Alcohol Use: No   OB History   Grav Para Term Preterm Abortions TAB SAB Ect Mult Living                 Review of Systems  Constitutional: Negative for fever and chills.  Respiratory: Negative for cough.   Cardiovascular: Negative for chest pain and leg swelling.  Gastrointestinal: Negative for nausea, vomiting,  abdominal pain, diarrhea, constipation and blood in stool.  Genitourinary: Negative for dysuria, hematuria and pelvic pain.  Musculoskeletal: Positive for arthralgias (L hip pain) and gait problem (painful to ambulate on L hip). Negative for back pain, joint swelling, myalgias, neck pain and neck stiffness.  Skin: Negative for rash.  Neurological: Negative for weakness, light-headedness, numbness and headaches.  Psychiatric/Behavioral: Negative for confusion.  10 Systems reviewed and are negative for acute change except as noted in the HPI.     Allergies  Sulfa antibiotics  Home Medications   Prior to Admission medications   Medication Sig Start Date End  Date Taking? Authorizing Provider  acetaminophen (TYLENOL) 500 MG tablet Take 500 mg by mouth every 6 (six) hours as needed.   Yes Historical Provider, MD  aspirin 81 MG tablet Take 81 mg by mouth every evening.   Yes Historical Provider, MD  atorvastatin (LIPITOR) 80 MG tablet Take 80 mg by mouth daily.  08/26/13  Yes Historical Provider, MD  Cholecalciferol 2000 UNITS TABS Take 2,000 Units by mouth daily.    Yes Historical Provider, MD  DULoxetine (CYMBALTA) 30 MG capsule Take 30 mg by mouth daily.   Yes Historical Provider, MD  INVOKANA 100 MG TABS Take 100 mg by mouth daily before breakfast.  09/25/13  Yes Historical Provider, MD  lisinopril (PRINIVIL,ZESTRIL) 5 MG tablet Take 5 mg by mouth daily.   Yes Historical Provider, MD  Multiple Vitamins-Minerals (WOMENS MULTIVITAMIN PLUS PO) Take 1 tablet by mouth daily.    Yes Historical Provider, MD  pantoprazole (PROTONIX) 40 MG tablet Take 40 mg by mouth daily.  08/26/13  Yes Historical Provider, MD  rOPINIRole (REQUIP) 1 MG tablet Take 4 mg by mouth daily.    Yes Historical Provider, MD  tiZANidine (ZANAFLEX) 4 MG tablet Take 8 mg by mouth every 8 (eight) hours as needed (Pain).  07/15/13  Yes Historical Provider, MD  traMADol (ULTRAM) 50 MG tablet Take 50-100 mg by mouth every 8 (eight) hours as needed for moderate pain.   Yes Historical Provider, MD   BP 114/49  Pulse 90  Temp(Src) 98.5 F (36.9 C) (Oral)  Resp 18  SpO2 96% Physical Exam  Nursing note and vitals reviewed. Constitutional: She is oriented to person, place, and time. Vital signs are normal. She appears well-developed and well-nourished. No distress.  VSS, afebrile, nontoxic, pleasant  HENT:  Head: Normocephalic and atraumatic.  Mouth/Throat: Mucous membranes are normal.  Eyes: Conjunctivae and EOM are normal. Right eye exhibits no discharge. Left eye exhibits no discharge.  Neck: Normal range of motion. Neck supple. No spinous process tenderness and no muscular tenderness  present. No rigidity. Normal range of motion present.  FROM intact without spinous process or paraspinous muscle TTP, no bony stepoffs or deformities, no muscle spasms. No rigidity or meningeal signs. No bruising or swelling.  Cardiovascular: Normal rate, regular rhythm, normal heart sounds and intact distal pulses.  Exam reveals no gallop and no friction rub.   No murmur heard. Distal pulses intact, initially had RRR with nl s1/s2, but upon re-eval prior to attempted discharge noted HR 80-150s, irregularly irregular.   Pulmonary/Chest: Effort normal and breath sounds normal. No respiratory distress. She has no decreased breath sounds. She has no wheezes. She has no rhonchi. She has no rales.  Abdominal: Soft. Normal appearance and bowel sounds are normal. She exhibits no distension. There is no tenderness. There is no rigidity, no rebound, no guarding and no CVA tenderness.  Musculoskeletal:  Left hip: She exhibits decreased range of motion (secondary to pain, mostly with hip flexion) and tenderness (over ischial tuberosity and greater trochanter). She exhibits normal strength, no bony tenderness, no swelling, no crepitus and no deformity.       Legs: Limited ROM to L hip secondary to pain, mostly with flexion, able to fully extend, full int/ext rotation. TTP over greater trochanter and ischial tuberosity, no swelling, warmth, deformity, or crepitus noted. Strength 5/5 in all extremities, sensation grossly intact in all extremities  Neurological: She is alert and oriented to person, place, and time. She has normal strength. No sensory deficit. Gait (painful to bear weight into L leg) abnormal.  Skin: Skin is warm, dry and intact. No rash noted.  No warmth or erythema over L hip  Psychiatric: She has a normal mood and affect.    ED Course  Procedures (including critical care time) Labs Review Labs Reviewed  PRO B NATRIURETIC PEPTIDE - Abnormal; Notable for the following:    Pro B  Natriuretic peptide (BNP) 2190.0 (*)    All other components within normal limits  URINALYSIS, ROUTINE W REFLEX MICROSCOPIC - Abnormal; Notable for the following:    Glucose, UA >1000 (*)    All other components within normal limits  BASIC METABOLIC PANEL - Abnormal; Notable for the following:    Glucose, Bld 128 (*)    BUN 28 (*)    Creatinine, Ser 1.48 (*)    GFR calc non Af Amer 35 (*)    GFR calc Af Amer 41 (*)    All other components within normal limits  URINE MICROSCOPIC-ADD ON - Abnormal; Notable for the following:    Squamous Epithelial / LPF FEW (*)    All other components within normal limits  CBC WITH DIFFERENTIAL  MAGNESIUM  TSH  I-STAT TROPOININ, ED    Imaging Review Dg Hip Complete Left  08/14/2014   CLINICAL DATA:  Posterior left hip pain  EXAM: LEFT HIP - COMPLETE 2+ VIEW  COMPARISON:  None.  FINDINGS: Left total hip arthroplasty.  No evidence of complication.  No fracture or dislocation is seen.  Visualized bony pelvis appears intact.  Mild degenerative changes of the lower lumbar spine.  IMPRESSION: No fracture or dislocation is seen.  Left total hip arthroplasty, without evidence of complication.   Electronically Signed   By: Julian Hy M.D.   On: 08/14/2014 11:10   Dg Chest Port 1 View  08/14/2014   CLINICAL DATA:  Atrial fibrillation  EXAM: PORTABLE CHEST - 1 VIEW  COMPARISON:  11/15/2009  FINDINGS: The heart size and mediastinal contours are within normal limits. Both lungs are clear. The visualized skeletal structures are unremarkable.  IMPRESSION: No active disease.   Electronically Signed   By: Maryclare Bean M.D.   On: 08/14/2014 14:31     EKG Interpretation None    EKG read: Afib, HR 121, LAFB with minimal ST depression in lateral leads and long QT >538ms. No comparison EKG in chart. No widening of QRS, no STEMI.   MDM   Final diagnoses:  New onset atrial fibrillation  Hip pain, left    68y/o female with L hip pain. Seems to be over bursa. No  trauma or injury known, but given hip replacement will obtain xrays to eval hardware. No fever or red flag s/sx, doubt need for emergent MRI. Pt states she' can't take NSAIDs per her doctor due to kidney issues in the past. Wants to try tylenol here in  the ED instead of any narcotics. Will reassess after imaging.  11:42 AM Xray neg for acute bony changes. I believe this pain is bursitis pain. Discussed using prednisone in short course, although her sugar will increase, but given that she can't take NSAIDs this is the alternative. Upon exam to discuss this, pt states her BP was concerning her because she usually has "normal" BP. BPs noted at lower 100s/60s. HR intermittently increasing but resolves by itself. Pt denies any symptoms. Will have her drink fluids as she doesn't want to get IVFs, and reassess. Of note, tylenol has not helped relieve the pain, but pt is moving her hip in bed with ease.   12:44 PM Pt stating she feels fine and ready to go, but after obtaining EKG for the intermittent tachycardia, it appears she has new onset afib. Upon further questioning, she finally recalls that she had some palpitations yesterday, but they went away and denies any cardiac symptoms now. She has had some left shoulder aching since yesterday, which is now present but wasn't upon arrival. Will obtain cardiac labs now and consult cardiology. Will obtain portable CXR now as well. Will reassess.   3:09 PM All results returning and as follows: trop neg, BNP 2190, CBC WNL, CMP showing mildly elevated BUN 28, Cr 1.48, GFR 35 which are close to baseline, pt on invokana which is likely resulted in insult. Encouraging fluids, want to avoid fluid overload therefore gentle oral rehydration for now. Mg WNL, U/A showing glucosuria consistent with invokana use, no UTI. CXR unremarkable. Pt feeling well, intermittently going into tachycardia to 120s-130s, but feels fine. Remains stable. Dr. Ron Parker of cardiology returning page, will  come see pt and eval need for admission.   4:42 PM Cardiology evaluated, requested she be started on Cardizem 15mg  bolus + 5mg /hr now, and they will admit. Pt care assumed by Dr. Ron Parker, please see his dictation for further documentation of care. VSS and pt well appearing, remains stable. Ongoing hip pain but discussed that she will need to defer to cardiology for management of this, given that I do not want to give prednisone or any other meds that would interfere with their management.   BP 135/56  Pulse 80  Temp(Src) 98.8 F (37.1 C) (Oral)  Resp 22  SpO2 95%  Meds ordered this encounter  Medications  . acetaminophen (TYLENOL) tablet 650 mg    Sig:   . diltiazem (CARDIZEM) 1 mg/mL load via infusion 15 mg    Sig:    And  . diltiazem (CARDIZEM) 100 mg in dextrose 5 % 100 mL (1 mg/mL) infusion    Sig:      Patty Sermons Camprubi-Soms, PA-C 08/14/14 1646

## 2014-08-14 NOTE — ED Notes (Signed)
Per pt sts she woke up this am with severe left hip pain with movement. sts she went to the bathroom and needed assistance. Pt had hip replacement years ago.

## 2014-08-15 ENCOUNTER — Encounter (HOSPITAL_COMMUNITY): Payer: Self-pay | Admitting: General Practice

## 2014-08-15 DIAGNOSIS — I517 Cardiomegaly: Secondary | ICD-10-CM

## 2014-08-15 DIAGNOSIS — I4891 Unspecified atrial fibrillation: Secondary | ICD-10-CM | POA: Diagnosis not present

## 2014-08-15 LAB — CBC
HCT: 42.2 % (ref 36.0–46.0)
Hemoglobin: 14.4 g/dL (ref 12.0–15.0)
MCH: 30.9 pg (ref 26.0–34.0)
MCHC: 34.1 g/dL (ref 30.0–36.0)
MCV: 90.6 fL (ref 78.0–100.0)
PLATELETS: 227 10*3/uL (ref 150–400)
RBC: 4.66 MIL/uL (ref 3.87–5.11)
RDW: 12.5 % (ref 11.5–15.5)
WBC: 6.9 10*3/uL (ref 4.0–10.5)

## 2014-08-15 LAB — HEPARIN LEVEL (UNFRACTIONATED)
HEPARIN UNFRACTIONATED: 0.27 [IU]/mL — AB (ref 0.30–0.70)
Heparin Unfractionated: 0.33 IU/mL (ref 0.30–0.70)

## 2014-08-15 LAB — TROPONIN I: Troponin I: <0.3 ng/mL (ref <0.30)

## 2014-08-15 MED ORDER — APIXABAN 5 MG PO TABS: 5.0000 mg | ORAL_TABLET | Freq: Two times a day (BID) | ORAL | Status: DC

## 2014-08-15 MED ORDER — DILTIAZEM HCL ER 120 MG PO CP12: 120.0000 mg | ORAL_CAPSULE | Freq: Two times a day (BID) | ORAL | Status: DC

## 2014-08-15 MED ORDER — OFF THE BEAT BOOK
Freq: Once | Status: DC
  Filled 2014-08-15: qty 1

## 2014-08-15 MED ORDER — ZOLPIDEM TARTRATE 5 MG PO TABS: 5.0000 mg | ORAL_TABLET | Freq: Every evening | ORAL | Status: DC | PRN

## 2014-08-15 NOTE — Progress Notes (Signed)
ANTICOAGULATION CONSULT NOTE - Follow-up Consult  Pharmacy Consult for heparin Indication: atrial fibrillation  Allergies  Allergen Reactions  . Sulfa Antibiotics     srtong itch reaction    Patient Measurements: Height: 5\' 8"  (172.7 cm) Weight: 206 lb (93.441 kg) IBW/kg (Calculated) : 63.9 Heparin Dosing Weight: 84 kg  Vital Signs: Temp: 98.5 F (36.9 C) (09/05 2000) Temp src: Oral (09/05 1805) BP: 125/65 mmHg (09/05 2031) Pulse Rate: 84 (09/05 2000)  Labs:  Recent Labs  08/14/14 1353 08/15/14 0245  HGB 15.0 14.4  HCT 43.4 42.2  PLT 236 227  HEPARINUNFRC  --  0.33  CREATININE 1.48*  --     Estimated Creatinine Clearance: 42.9 ml/min (by C-G formula based on Cr of 1.48).  Assessment: 68 yo female on heparin for new diagnosis of afib. Heparin level 0.33 (therapeutic) on 1150 units/hr. No bleeding noted. CBC stable.  Goal of Therapy:  Heparin level 0.3-0.7 units/ml Monitor platelets by anticoagulation protocol: Yes   Plan:  - Continue heparin drip at 1150 units/hr - Check 6 hr confirmation level  Sherlon Handing, PharmD, BCPS Clinical pharmacist, pager [phone] 08/15/2014,3:29 AM

## 2014-08-15 NOTE — Progress Notes (Signed)
Physician Discharge Summary     Cardiologist:  [facility]  Patient ID: Virl Doakes MRN: [id number] DOB/AGE: Jan 31, 1946 68 y.o.  Admit date: 08/14/2014 Discharge date: 08/15/2014  Admission Diagnoses:  Atrial fib RVR  Discharge Diagnoses:  Active Problems:   Atrial fibrillation   L arm pain.   Orthostatic intolerance   DM  Discharged Condition: stable  Hospital Course:  Heather [name] is a 68 y.o. Female one year post hip replacement came intoday for symptoms of hip pain Thought to be bursitis.  Noted to have ECG >>AFfib 121 Cardiology called. Then noticed some L arm pain and now complaining of L ear fullness.  Has had recurrent palpitations over the last five years, but not really aware of afib at this time. Has some dizziness which is largely positional or with changes in position.  DM, gender age. Hx of DM and morbid obesity  She was admitted and started on iv diltiazem which was then converted to 120mg  bid.  She converted to NSR.  She will be started on apixaban but after skin biopsy on Wednesday.  Troponin negative times one.   CXR and Left hip were negative for acute abnormality.  2D echo recommended at Dr. Joya Gaskins office in [place], where she will follow up.  The patient was seen by Dr. Caryl Comes who felt she was stable for DC home.   Consults: None  Significant Diagnostic Studies:  PORTABLE CHEST - 1 VIEW  COMPARISON: 11/15/2009  FINDINGS:  The heart size and mediastinal contours are within normal limits.  Both lungs are clear. The visualized skeletal structures are  unremarkable.  IMPRESSION:  No active disease.  LEFT HIP - COMPLETE 2+ VIEW  COMPARISON: None.  FINDINGS:  Left total hip arthroplasty. No evidence of complication.  No fracture or dislocation is seen.  Visualized bony pelvis appears intact.  Mild degenerative changes of the lower lumbar spine.  IMPRESSION:  No fracture or dislocation is seen.  Left total hip arthroplasty, without evidence of  complication.  Treatments: See above  Discharge Exam: Blood pressure 147/41, pulse 70, temperature 99.2 F (37.3 C), temperature source Oral, resp. rate 18, height 5\' 8"  (1.727 m), weight 206 lb (93.441 kg), SpO2 94.00%.  Disposition:   DC Home      Medication List         acetaminophen 500 MG tablet  Commonly known as:  TYLENOL  Take 500 mg by mouth every 6 (six) hours as needed.     apixaban 5 MG Tabs tablet  Commonly known as:  ELIQUIS  Take 1 tablet (5 mg total) by mouth 2 (two) times daily.     aspirin 81 MG tablet  Take 81 mg by mouth every evening.     atorvastatin 80 MG tablet  Commonly known as:  LIPITOR  Take 80 mg by mouth daily.     Cholecalciferol 2000 UNITS Tabs  Take 2,000 Units by mouth daily.     diltiazem 120 MG 12 hr capsule  Commonly known as:  CARDIZEM SR  Take 1 capsule (120 mg total) by mouth every 12 (twelve) hours.     DULoxetine 30 MG capsule  Commonly known as:  CYMBALTA  Take 30 mg by mouth daily.     INVOKANA 100 MG Tabs  Generic drug:  Canagliflozin  Take 100 mg by mouth daily before breakfast.     lisinopril 5 MG tablet  Commonly known as:  PRINIVIL,ZESTRIL  Take 5 mg by mouth daily.     pantoprazole 40  MG tablet  Commonly known as:  PROTONIX  Take 40 mg by mouth daily.     rOPINIRole 1 MG tablet  Commonly known as:  REQUIP  Take 4 mg by mouth daily.     tiZANidine 4 MG tablet  Commonly known as:  ZANAFLEX  Take 8 mg by mouth every 8 (eight) hours as needed (Pain).     traMADol 50 MG tablet  Commonly known as:  ULTRAM  Take 50-100 mg by mouth every 8 (eight) hours as needed for moderate pain.     WOMENS MULTIVITAMIN PLUS PO  Take 1 tablet by mouth daily.       Follow-up Information   Follow up with [facility], [place]. (The office will call you with the appt date and time. If they do not call Tuesday, give tham a call.)    Specialty:  Cardiology   Contact information:   [address] [place]  [address] [phone]       Follow up with [place]. (If you have questions before you see Dr. Bettina Gavia, call us at [phone])      Greater than 30 minutes was spent completing the patient's discharge.    SignedTarri Fuller, [place] 08/15/2014, 11:09 AM

## 2014-08-15 NOTE — Progress Notes (Signed)
       Patient Name: Heather [name]      SUBJECTIVE:  Feels much better   Past Medical History  Diagnosis Date  . Diabetes mellitus, type II   . Restless legs 2012  . Osteoarthritis   . Proliferative diabetic retinopathy of both eyes   . Bell's palsy   . Breast cancer   . Low back pain with sciatica     Scheduled Meds:  Scheduled Meds: . diltiazem  120 mg Oral Q12H  . off the beat book   Does not apply Once  . rOPINIRole  4 mg Oral QHS  . sodium chloride  3 mL Intravenous Q12H   Continuous Infusions: . diltiazem (CARDIZEM) infusion Stopped (08/14/14 2201)  . heparin 1,150 Units/hr (08/14/14 1715)   sodium chloride, acetaminophen, ondansetron (ZOFRAN) IV, sodium chloride, zolpidem    PHYSICAL EXAM Filed Vitals:   08/14/14 1805 08/14/14 2000 08/14/14 2031 08/15/14 0500  BP: 128/62 139/58 125/65 147/41  Pulse: 84 84  70  Temp: 98.6 F (37 C) 98.5 F (36.9 C)  99.2 F (37.3 C)  TempSrc: Oral     Resp: 18 18  18   Height:      Weight:      SpO2: 100% 96%  94%    Well developed and nourished in no acute distress HENT normal Neck supple with JVP-flat Clear Regular rate and rhythm, no murmurs or gallops Abd-soft with active BS No Clubbing cyanosis edema Skin-warm and dry A & Oriented  Grossly normal sensory and motor function   TELEMETRY: Reviewed telemetry pt in Afib>>NSR   Intake/Output Summary (Last 24 hours) at 08/15/14 0959 Last data filed at 08/15/14 0532  Gross per 24 hour  Intake    600 ml  Output    676 ml  Net    -76 ml    LABS: Basic Metabolic Panel:  Recent Labs Lab 08/14/14 1353  NA 141  K 4.2  CL 102  CO2 24  GLUCOSE 128*  BUN 28*  CREATININE 1.48*  CALCIUM 9.2  MG 2.0   Cardiac Enzymes: No results found for this basename: CKTOTAL, CKMB, CKMBINDEX, TROPONINI,  in the last 72 hours CBC:  Recent Labs Lab 08/14/14 1353 08/15/14 0245  WBC 6.4 6.9  NEUTROABS 4.0  --   HGB 15.0 14.4  HCT 43.4 42.2  MCV 90.8 90.6    PLT 236 227   PROTIME: No results found for this basename: LABPROT, INR,  in the last 72 hours Liver Function Tests: No results found for this basename: AST, ALT, ALKPHOS, BILITOT, PROT, ALBUMIN,  in the last 72 hours No results found for this basename: LIPASE, AMYLASE,  in the last 72 hours BNP: BNP (last 3 results)  Recent Labs  08/14/14 1353  PROBNP 2190.0*   D-Dimer: No results found for this basename: DDIMER,  in the last 72 hours Hemoglobin A1C: No results found for this basename: HGBA1C,  in the last 72 hours Fasting Lipid Panel: No results found for this basename: CHOL, HDL, LDLCALC, TRIG, CHOLHDL, LDLDIRECT,  in the last 72 hours Thyroid Function Tests:  Recent Labs  08/14/14 1656  TSH 0.584      ASSESSMENT AND PLAN:  Active Problems:   Atrial fibrillation  Discharge to home  Begin apixoban following the biopsy Discharge with dilt  follwoup with [facility] in [place], Virl Axe MD  08/15/2014

## 2014-08-15 NOTE — Progress Notes (Signed)
Utilization Review Completed.   Katurah Karapetian, RN, BSN Nurse Case Manager  

## 2014-08-15 NOTE — Progress Notes (Signed)
  Echocardiogram 2D Echocardiogram has been performed.  Donata Clay 08/15/2014, 12:49 PM

## 2014-08-17 NOTE — ED Provider Notes (Signed)
Medical screening examination/treatment/procedure(s) were performed by non-physician practitioner and as supervising physician I was immediately available for consultation/collaboration.   EKG Interpretation   Date/Time:  Saturday August 14 2014 12:07:54 EDT Ventricular Rate:  121 PR Interval:    QRS Duration: 98 QT Interval:  357 QTC Calculation: 506 R Axis:   -43 Text Interpretation:  Atrial fibrillation Left anterior fascicular block  Borderline low voltage, extremity leads Anteroseptal infarct, old Minimal  ST depression, lateral leads Prolonged QT interval ED PHYSICIAN  INTERPRETATION AVAILABLE IN [facility] Confirmed by TEST, Record  ([id number]) on 08/16/2014 7:48:46 AM        Charlesetta Shanks, MD 08/17/14 1106

## 2014-08-18 DIAGNOSIS — C44711 Basal cell carcinoma of skin of unspecified lower limb, including hip: Secondary | ICD-10-CM | POA: Diagnosis not present

## 2014-08-19 DIAGNOSIS — J069 Acute upper respiratory infection, unspecified: Secondary | ICD-10-CM | POA: Diagnosis not present

## 2014-08-26 DIAGNOSIS — F39 Unspecified mood [affective] disorder: Secondary | ICD-10-CM | POA: Diagnosis not present

## 2014-08-30 DIAGNOSIS — E1139 Type 2 diabetes mellitus with other diabetic ophthalmic complication: Secondary | ICD-10-CM | POA: Diagnosis not present

## 2014-08-30 DIAGNOSIS — N183 Chronic kidney disease, stage 3 unspecified: Secondary | ICD-10-CM | POA: Diagnosis not present

## 2014-08-30 DIAGNOSIS — E11319 Type 2 diabetes mellitus with unspecified diabetic retinopathy without macular edema: Secondary | ICD-10-CM | POA: Diagnosis not present

## 2014-08-30 DIAGNOSIS — E1149 Type 2 diabetes mellitus with other diabetic neurological complication: Secondary | ICD-10-CM | POA: Diagnosis not present

## 2014-08-30 DIAGNOSIS — Z7901 Long term (current) use of anticoagulants: Secondary | ICD-10-CM | POA: Diagnosis not present

## 2014-08-30 DIAGNOSIS — Z8639 Personal history of other endocrine, nutritional and metabolic disease: Secondary | ICD-10-CM | POA: Diagnosis not present

## 2014-08-30 DIAGNOSIS — Z862 Personal history of diseases of the blood and blood-forming organs and certain disorders involving the immune mechanism: Secondary | ICD-10-CM | POA: Diagnosis not present

## 2014-08-30 DIAGNOSIS — I4891 Unspecified atrial fibrillation: Secondary | ICD-10-CM | POA: Diagnosis not present

## 2014-08-31 DIAGNOSIS — F39 Unspecified mood [affective] disorder: Secondary | ICD-10-CM | POA: Diagnosis not present

## 2014-09-02 DIAGNOSIS — I4891 Unspecified atrial fibrillation: Secondary | ICD-10-CM | POA: Diagnosis not present

## 2014-09-03 DIAGNOSIS — I4891 Unspecified atrial fibrillation: Secondary | ICD-10-CM | POA: Diagnosis not present

## 2014-09-07 DIAGNOSIS — F39 Unspecified mood [affective] disorder: Secondary | ICD-10-CM | POA: Diagnosis not present

## 2014-09-13 DIAGNOSIS — I4891 Unspecified atrial fibrillation: Secondary | ICD-10-CM | POA: Diagnosis not present

## 2014-09-15 DIAGNOSIS — C44612 Basal cell carcinoma of skin of right upper limb, including shoulder: Secondary | ICD-10-CM | POA: Diagnosis not present

## 2014-09-15 DIAGNOSIS — R233 Spontaneous ecchymoses: Secondary | ICD-10-CM | POA: Diagnosis not present

## 2014-09-15 DIAGNOSIS — L570 Actinic keratosis: Secondary | ICD-10-CM | POA: Diagnosis not present

## 2014-09-15 DIAGNOSIS — D0461 Carcinoma in situ of skin of right upper limb, including shoulder: Secondary | ICD-10-CM | POA: Diagnosis not present

## 2014-09-21 DIAGNOSIS — F39 Unspecified mood [affective] disorder: Secondary | ICD-10-CM | POA: Diagnosis not present

## 2014-09-30 DIAGNOSIS — F39 Unspecified mood [affective] disorder: Secondary | ICD-10-CM | POA: Diagnosis not present

## 2014-10-11 DIAGNOSIS — I480 Paroxysmal atrial fibrillation: Secondary | ICD-10-CM | POA: Diagnosis not present

## 2014-10-11 DIAGNOSIS — E1142 Type 2 diabetes mellitus with diabetic polyneuropathy: Secondary | ICD-10-CM | POA: Diagnosis not present

## 2014-10-18 DIAGNOSIS — E780 Pure hypercholesterolemia: Secondary | ICD-10-CM | POA: Diagnosis not present

## 2014-10-18 DIAGNOSIS — E11319 Type 2 diabetes mellitus with unspecified diabetic retinopathy without macular edema: Secondary | ICD-10-CM | POA: Diagnosis not present

## 2014-10-18 DIAGNOSIS — Z23 Encounter for immunization: Secondary | ICD-10-CM | POA: Diagnosis not present

## 2014-10-18 DIAGNOSIS — N189 Chronic kidney disease, unspecified: Secondary | ICD-10-CM | POA: Diagnosis not present

## 2014-10-18 DIAGNOSIS — E1129 Type 2 diabetes mellitus with other diabetic kidney complication: Secondary | ICD-10-CM | POA: Diagnosis not present

## 2014-10-18 DIAGNOSIS — F329 Major depressive disorder, single episode, unspecified: Secondary | ICD-10-CM | POA: Diagnosis not present

## 2014-10-19 DIAGNOSIS — F39 Unspecified mood [affective] disorder: Secondary | ICD-10-CM | POA: Diagnosis not present

## 2014-10-21 DIAGNOSIS — F39 Unspecified mood [affective] disorder: Secondary | ICD-10-CM | POA: Diagnosis not present

## 2014-10-25 DIAGNOSIS — F39 Unspecified mood [affective] disorder: Secondary | ICD-10-CM | POA: Diagnosis not present

## 2014-10-26 DIAGNOSIS — E11319 Type 2 diabetes mellitus with unspecified diabetic retinopathy without macular edema: Secondary | ICD-10-CM | POA: Diagnosis not present

## 2014-10-26 DIAGNOSIS — F329 Major depressive disorder, single episode, unspecified: Secondary | ICD-10-CM | POA: Diagnosis not present

## 2014-10-26 DIAGNOSIS — E1129 Type 2 diabetes mellitus with other diabetic kidney complication: Secondary | ICD-10-CM | POA: Diagnosis not present

## 2014-10-26 DIAGNOSIS — E780 Pure hypercholesterolemia: Secondary | ICD-10-CM | POA: Diagnosis not present

## 2014-10-26 DIAGNOSIS — N189 Chronic kidney disease, unspecified: Secondary | ICD-10-CM | POA: Diagnosis not present

## 2014-11-03 DIAGNOSIS — I480 Paroxysmal atrial fibrillation: Secondary | ICD-10-CM | POA: Diagnosis not present

## 2014-11-09 DIAGNOSIS — F39 Unspecified mood [affective] disorder: Secondary | ICD-10-CM | POA: Diagnosis not present

## 2014-11-09 DIAGNOSIS — J0100 Acute maxillary sinusitis, unspecified: Secondary | ICD-10-CM | POA: Diagnosis not present

## 2014-11-15 DIAGNOSIS — Z7901 Long term (current) use of anticoagulants: Secondary | ICD-10-CM | POA: Diagnosis not present

## 2014-11-15 DIAGNOSIS — I480 Paroxysmal atrial fibrillation: Secondary | ICD-10-CM | POA: Diagnosis not present

## 2014-11-15 DIAGNOSIS — N183 Chronic kidney disease, stage 3 (moderate): Secondary | ICD-10-CM | POA: Diagnosis not present

## 2014-11-16 DIAGNOSIS — F39 Unspecified mood [affective] disorder: Secondary | ICD-10-CM | POA: Diagnosis not present

## 2014-11-18 DIAGNOSIS — Z853 Personal history of malignant neoplasm of breast: Secondary | ICD-10-CM | POA: Diagnosis not present

## 2014-11-18 DIAGNOSIS — Z1231 Encounter for screening mammogram for malignant neoplasm of breast: Secondary | ICD-10-CM | POA: Diagnosis not present

## 2014-11-22 DIAGNOSIS — J069 Acute upper respiratory infection, unspecified: Secondary | ICD-10-CM | POA: Diagnosis not present

## 2014-11-25 DIAGNOSIS — E780 Pure hypercholesterolemia: Secondary | ICD-10-CM | POA: Diagnosis not present

## 2014-11-25 DIAGNOSIS — S59909A Unspecified injury of unspecified elbow, initial encounter: Secondary | ICD-10-CM | POA: Diagnosis not present

## 2014-11-25 DIAGNOSIS — Z96651 Presence of right artificial knee joint: Secondary | ICD-10-CM | POA: Diagnosis not present

## 2014-11-25 DIAGNOSIS — R51 Headache: Secondary | ICD-10-CM | POA: Diagnosis not present

## 2014-11-25 DIAGNOSIS — R55 Syncope and collapse: Secondary | ICD-10-CM | POA: Diagnosis not present

## 2014-11-25 DIAGNOSIS — E860 Dehydration: Secondary | ICD-10-CM | POA: Diagnosis not present

## 2014-11-25 DIAGNOSIS — Z96652 Presence of left artificial knee joint: Secondary | ICD-10-CM | POA: Diagnosis not present

## 2014-11-25 DIAGNOSIS — M25562 Pain in left knee: Secondary | ICD-10-CM | POA: Diagnosis not present

## 2014-11-25 DIAGNOSIS — I951 Orthostatic hypotension: Secondary | ICD-10-CM | POA: Diagnosis not present

## 2014-11-25 DIAGNOSIS — I6782 Cerebral ischemia: Secondary | ICD-10-CM | POA: Diagnosis not present

## 2014-11-25 DIAGNOSIS — M25561 Pain in right knee: Secondary | ICD-10-CM | POA: Diagnosis not present

## 2014-11-25 DIAGNOSIS — Z471 Aftercare following joint replacement surgery: Secondary | ICD-10-CM | POA: Diagnosis not present

## 2014-11-25 DIAGNOSIS — J189 Pneumonia, unspecified organism: Secondary | ICD-10-CM | POA: Diagnosis not present

## 2014-11-25 DIAGNOSIS — R531 Weakness: Secondary | ICD-10-CM | POA: Diagnosis not present

## 2014-11-25 DIAGNOSIS — S060X9A Concussion with loss of consciousness of unspecified duration, initial encounter: Secondary | ICD-10-CM | POA: Diagnosis not present

## 2014-11-25 DIAGNOSIS — S299XXA Unspecified injury of thorax, initial encounter: Secondary | ICD-10-CM | POA: Diagnosis not present

## 2014-11-25 DIAGNOSIS — M25522 Pain in left elbow: Secondary | ICD-10-CM | POA: Diagnosis not present

## 2014-11-25 DIAGNOSIS — R404 Transient alteration of awareness: Secondary | ICD-10-CM | POA: Diagnosis not present

## 2014-11-25 DIAGNOSIS — S8991XA Unspecified injury of right lower leg, initial encounter: Secondary | ICD-10-CM | POA: Diagnosis not present

## 2014-11-25 DIAGNOSIS — S59902A Unspecified injury of left elbow, initial encounter: Secondary | ICD-10-CM | POA: Diagnosis not present

## 2014-12-16 DIAGNOSIS — R32 Unspecified urinary incontinence: Secondary | ICD-10-CM | POA: Diagnosis not present

## 2014-12-16 DIAGNOSIS — R351 Nocturia: Secondary | ICD-10-CM | POA: Diagnosis not present

## 2014-12-28 DIAGNOSIS — F39 Unspecified mood [affective] disorder: Secondary | ICD-10-CM | POA: Diagnosis not present

## 2015-01-04 DIAGNOSIS — D0511 Intraductal carcinoma in situ of right breast: Secondary | ICD-10-CM | POA: Diagnosis not present

## 2015-01-04 DIAGNOSIS — E669 Obesity, unspecified: Secondary | ICD-10-CM | POA: Diagnosis not present

## 2015-01-04 DIAGNOSIS — Z6833 Body mass index (BMI) 33.0-33.9, adult: Secondary | ICD-10-CM | POA: Diagnosis not present

## 2015-01-05 DIAGNOSIS — I480 Paroxysmal atrial fibrillation: Secondary | ICD-10-CM | POA: Diagnosis not present

## 2015-01-05 DIAGNOSIS — Z7901 Long term (current) use of anticoagulants: Secondary | ICD-10-CM | POA: Diagnosis not present

## 2015-01-06 DIAGNOSIS — F39 Unspecified mood [affective] disorder: Secondary | ICD-10-CM | POA: Diagnosis not present

## 2015-01-14 DIAGNOSIS — I480 Paroxysmal atrial fibrillation: Secondary | ICD-10-CM | POA: Diagnosis not present

## 2015-01-20 DIAGNOSIS — F39 Unspecified mood [affective] disorder: Secondary | ICD-10-CM | POA: Diagnosis not present

## 2015-01-25 DIAGNOSIS — I480 Paroxysmal atrial fibrillation: Secondary | ICD-10-CM | POA: Diagnosis not present

## 2015-01-27 DIAGNOSIS — N3090 Cystitis, unspecified without hematuria: Secondary | ICD-10-CM | POA: Diagnosis not present

## 2015-01-27 DIAGNOSIS — N3281 Overactive bladder: Secondary | ICD-10-CM | POA: Diagnosis not present

## 2015-01-27 DIAGNOSIS — R351 Nocturia: Secondary | ICD-10-CM | POA: Diagnosis not present

## 2015-01-31 DIAGNOSIS — F39 Unspecified mood [affective] disorder: Secondary | ICD-10-CM | POA: Diagnosis not present

## 2015-02-15 DIAGNOSIS — F39 Unspecified mood [affective] disorder: Secondary | ICD-10-CM | POA: Diagnosis not present

## 2015-02-21 DIAGNOSIS — Z79899 Other long term (current) drug therapy: Secondary | ICD-10-CM | POA: Diagnosis not present

## 2015-02-21 DIAGNOSIS — E780 Pure hypercholesterolemia: Secondary | ICD-10-CM | POA: Diagnosis not present

## 2015-02-21 DIAGNOSIS — F329 Major depressive disorder, single episode, unspecified: Secondary | ICD-10-CM | POA: Diagnosis not present

## 2015-02-21 DIAGNOSIS — E11319 Type 2 diabetes mellitus with unspecified diabetic retinopathy without macular edema: Secondary | ICD-10-CM | POA: Diagnosis not present

## 2015-02-21 DIAGNOSIS — E1129 Type 2 diabetes mellitus with other diabetic kidney complication: Secondary | ICD-10-CM | POA: Diagnosis not present

## 2015-02-21 DIAGNOSIS — N189 Chronic kidney disease, unspecified: Secondary | ICD-10-CM | POA: Diagnosis not present

## 2015-02-22 DIAGNOSIS — Z79899 Other long term (current) drug therapy: Secondary | ICD-10-CM | POA: Diagnosis not present

## 2015-02-22 DIAGNOSIS — E780 Pure hypercholesterolemia: Secondary | ICD-10-CM | POA: Diagnosis not present

## 2015-02-22 DIAGNOSIS — Z79891 Long term (current) use of opiate analgesic: Secondary | ICD-10-CM | POA: Diagnosis not present

## 2015-02-22 DIAGNOSIS — E11319 Type 2 diabetes mellitus with unspecified diabetic retinopathy without macular edema: Secondary | ICD-10-CM | POA: Diagnosis not present

## 2015-02-22 DIAGNOSIS — F39 Unspecified mood [affective] disorder: Secondary | ICD-10-CM | POA: Diagnosis not present

## 2015-02-22 DIAGNOSIS — E1139 Type 2 diabetes mellitus with other diabetic ophthalmic complication: Secondary | ICD-10-CM | POA: Diagnosis not present

## 2015-02-24 DIAGNOSIS — L948 Other specified localized connective tissue disorders: Secondary | ICD-10-CM | POA: Diagnosis not present

## 2015-02-24 DIAGNOSIS — L723 Sebaceous cyst: Secondary | ICD-10-CM | POA: Diagnosis not present

## 2015-03-03 DIAGNOSIS — F39 Unspecified mood [affective] disorder: Secondary | ICD-10-CM | POA: Diagnosis not present

## 2015-03-10 DIAGNOSIS — F39 Unspecified mood [affective] disorder: Secondary | ICD-10-CM | POA: Diagnosis not present

## 2015-03-15 DIAGNOSIS — F39 Unspecified mood [affective] disorder: Secondary | ICD-10-CM | POA: Diagnosis not present

## 2015-03-21 DIAGNOSIS — F39 Unspecified mood [affective] disorder: Secondary | ICD-10-CM | POA: Diagnosis not present

## 2015-03-29 DIAGNOSIS — E669 Obesity, unspecified: Secondary | ICD-10-CM | POA: Diagnosis not present

## 2015-03-29 DIAGNOSIS — G4710 Hypersomnia, unspecified: Secondary | ICD-10-CM | POA: Diagnosis not present

## 2015-03-31 DIAGNOSIS — F39 Unspecified mood [affective] disorder: Secondary | ICD-10-CM | POA: Diagnosis not present

## 2015-04-21 DIAGNOSIS — F39 Unspecified mood [affective] disorder: Secondary | ICD-10-CM | POA: Diagnosis not present

## 2015-04-26 DIAGNOSIS — F39 Unspecified mood [affective] disorder: Secondary | ICD-10-CM | POA: Diagnosis not present

## 2015-04-26 DIAGNOSIS — N390 Urinary tract infection, site not specified: Secondary | ICD-10-CM | POA: Diagnosis not present

## 2015-04-26 DIAGNOSIS — R531 Weakness: Secondary | ICD-10-CM | POA: Diagnosis not present

## 2015-04-26 DIAGNOSIS — I951 Orthostatic hypotension: Secondary | ICD-10-CM | POA: Diagnosis not present

## 2015-04-26 DIAGNOSIS — R42 Dizziness and giddiness: Secondary | ICD-10-CM | POA: Diagnosis not present

## 2015-05-02 DIAGNOSIS — N390 Urinary tract infection, site not specified: Secondary | ICD-10-CM | POA: Diagnosis not present

## 2015-05-02 DIAGNOSIS — I951 Orthostatic hypotension: Secondary | ICD-10-CM | POA: Diagnosis not present

## 2015-05-02 DIAGNOSIS — R55 Syncope and collapse: Secondary | ICD-10-CM | POA: Diagnosis not present

## 2015-05-02 DIAGNOSIS — J069 Acute upper respiratory infection, unspecified: Secondary | ICD-10-CM | POA: Diagnosis not present

## 2015-05-04 DIAGNOSIS — G2581 Restless legs syndrome: Secondary | ICD-10-CM | POA: Diagnosis not present

## 2015-05-04 DIAGNOSIS — G4733 Obstructive sleep apnea (adult) (pediatric): Secondary | ICD-10-CM | POA: Diagnosis not present

## 2015-05-04 DIAGNOSIS — J310 Chronic rhinitis: Secondary | ICD-10-CM | POA: Diagnosis not present

## 2015-05-04 DIAGNOSIS — I480 Paroxysmal atrial fibrillation: Secondary | ICD-10-CM | POA: Diagnosis not present

## 2015-05-12 DIAGNOSIS — F39 Unspecified mood [affective] disorder: Secondary | ICD-10-CM | POA: Diagnosis not present

## 2015-05-18 DIAGNOSIS — N39498 Other specified urinary incontinence: Secondary | ICD-10-CM | POA: Diagnosis not present

## 2015-05-18 DIAGNOSIS — E11319 Type 2 diabetes mellitus with unspecified diabetic retinopathy without macular edema: Secondary | ICD-10-CM | POA: Diagnosis not present

## 2015-05-18 DIAGNOSIS — R5381 Other malaise: Secondary | ICD-10-CM | POA: Diagnosis not present

## 2015-05-20 DIAGNOSIS — E119 Type 2 diabetes mellitus without complications: Secondary | ICD-10-CM | POA: Diagnosis not present

## 2015-05-25 DIAGNOSIS — F39 Unspecified mood [affective] disorder: Secondary | ICD-10-CM | POA: Diagnosis not present

## 2015-05-25 DIAGNOSIS — R32 Unspecified urinary incontinence: Secondary | ICD-10-CM | POA: Diagnosis not present

## 2015-05-25 DIAGNOSIS — N3289 Other specified disorders of bladder: Secondary | ICD-10-CM | POA: Diagnosis not present

## 2015-05-30 DIAGNOSIS — Z79899 Other long term (current) drug therapy: Secondary | ICD-10-CM | POA: Insufficient documentation

## 2015-05-30 DIAGNOSIS — Z7901 Long term (current) use of anticoagulants: Secondary | ICD-10-CM | POA: Insufficient documentation

## 2015-05-31 DIAGNOSIS — R069 Unspecified abnormalities of breathing: Secondary | ICD-10-CM | POA: Diagnosis not present

## 2015-05-31 DIAGNOSIS — G4733 Obstructive sleep apnea (adult) (pediatric): Secondary | ICD-10-CM | POA: Diagnosis not present

## 2015-05-31 DIAGNOSIS — G4761 Periodic limb movement disorder: Secondary | ICD-10-CM | POA: Diagnosis not present

## 2015-06-01 DIAGNOSIS — F39 Unspecified mood [affective] disorder: Secondary | ICD-10-CM | POA: Diagnosis not present

## 2015-06-08 DIAGNOSIS — G2581 Restless legs syndrome: Secondary | ICD-10-CM | POA: Diagnosis not present

## 2015-06-08 DIAGNOSIS — G4733 Obstructive sleep apnea (adult) (pediatric): Secondary | ICD-10-CM | POA: Diagnosis not present

## 2015-06-15 DIAGNOSIS — F39 Unspecified mood [affective] disorder: Secondary | ICD-10-CM | POA: Diagnosis not present

## 2015-06-20 ENCOUNTER — Other Ambulatory Visit: Payer: Self-pay | Admitting: Physician Assistant

## 2015-06-21 DIAGNOSIS — F39 Unspecified mood [affective] disorder: Secondary | ICD-10-CM | POA: Diagnosis not present

## 2015-06-23 DIAGNOSIS — E119 Type 2 diabetes mellitus without complications: Secondary | ICD-10-CM | POA: Diagnosis not present

## 2015-06-23 DIAGNOSIS — G4733 Obstructive sleep apnea (adult) (pediatric): Secondary | ICD-10-CM | POA: Diagnosis not present

## 2015-06-23 DIAGNOSIS — E780 Pure hypercholesterolemia: Secondary | ICD-10-CM | POA: Diagnosis not present

## 2015-06-23 DIAGNOSIS — I4891 Unspecified atrial fibrillation: Secondary | ICD-10-CM | POA: Diagnosis not present

## 2015-06-23 DIAGNOSIS — F39 Unspecified mood [affective] disorder: Secondary | ICD-10-CM | POA: Diagnosis not present

## 2015-06-23 DIAGNOSIS — Z79899 Other long term (current) drug therapy: Secondary | ICD-10-CM | POA: Diagnosis not present

## 2015-06-27 DIAGNOSIS — E119 Type 2 diabetes mellitus without complications: Secondary | ICD-10-CM | POA: Diagnosis not present

## 2015-06-27 DIAGNOSIS — E780 Pure hypercholesterolemia: Secondary | ICD-10-CM | POA: Diagnosis not present

## 2015-06-27 DIAGNOSIS — Z79899 Other long term (current) drug therapy: Secondary | ICD-10-CM | POA: Diagnosis not present

## 2015-06-28 DIAGNOSIS — F39 Unspecified mood [affective] disorder: Secondary | ICD-10-CM | POA: Diagnosis not present

## 2015-07-05 DIAGNOSIS — F39 Unspecified mood [affective] disorder: Secondary | ICD-10-CM | POA: Diagnosis not present

## 2015-07-12 DIAGNOSIS — N390 Urinary tract infection, site not specified: Secondary | ICD-10-CM | POA: Diagnosis not present

## 2015-07-12 DIAGNOSIS — N3090 Cystitis, unspecified without hematuria: Secondary | ICD-10-CM | POA: Diagnosis not present

## 2015-07-12 DIAGNOSIS — N3289 Other specified disorders of bladder: Secondary | ICD-10-CM | POA: Diagnosis not present

## 2015-07-13 DIAGNOSIS — F39 Unspecified mood [affective] disorder: Secondary | ICD-10-CM | POA: Diagnosis not present

## 2015-07-19 DIAGNOSIS — R253 Fasciculation: Secondary | ICD-10-CM | POA: Diagnosis not present

## 2015-07-19 DIAGNOSIS — E1142 Type 2 diabetes mellitus with diabetic polyneuropathy: Secondary | ICD-10-CM | POA: Diagnosis not present

## 2015-07-19 DIAGNOSIS — F39 Unspecified mood [affective] disorder: Secondary | ICD-10-CM | POA: Diagnosis not present

## 2015-07-19 DIAGNOSIS — G2581 Restless legs syndrome: Secondary | ICD-10-CM | POA: Diagnosis not present

## 2015-07-19 DIAGNOSIS — G43109 Migraine with aura, not intractable, without status migrainosus: Secondary | ICD-10-CM | POA: Diagnosis not present

## 2015-07-27 DIAGNOSIS — F39 Unspecified mood [affective] disorder: Secondary | ICD-10-CM | POA: Diagnosis not present

## 2015-08-02 DIAGNOSIS — F39 Unspecified mood [affective] disorder: Secondary | ICD-10-CM | POA: Diagnosis not present

## 2015-08-02 DIAGNOSIS — N3090 Cystitis, unspecified without hematuria: Secondary | ICD-10-CM | POA: Diagnosis not present

## 2015-08-02 DIAGNOSIS — N3289 Other specified disorders of bladder: Secondary | ICD-10-CM | POA: Diagnosis not present

## 2015-08-02 DIAGNOSIS — R300 Dysuria: Secondary | ICD-10-CM | POA: Diagnosis not present

## 2015-08-08 DIAGNOSIS — R490 Dysphonia: Secondary | ICD-10-CM | POA: Diagnosis not present

## 2015-08-08 DIAGNOSIS — G4733 Obstructive sleep apnea (adult) (pediatric): Secondary | ICD-10-CM | POA: Diagnosis not present

## 2015-08-09 DIAGNOSIS — F39 Unspecified mood [affective] disorder: Secondary | ICD-10-CM | POA: Diagnosis not present

## 2015-08-17 DIAGNOSIS — F39 Unspecified mood [affective] disorder: Secondary | ICD-10-CM | POA: Diagnosis not present

## 2015-08-23 DIAGNOSIS — N3080 Other cystitis without hematuria: Secondary | ICD-10-CM | POA: Diagnosis not present

## 2015-08-23 DIAGNOSIS — N3289 Other specified disorders of bladder: Secondary | ICD-10-CM | POA: Diagnosis not present

## 2015-08-23 DIAGNOSIS — R682 Dry mouth, unspecified: Secondary | ICD-10-CM | POA: Diagnosis not present

## 2015-08-23 DIAGNOSIS — N3090 Cystitis, unspecified without hematuria: Secondary | ICD-10-CM | POA: Diagnosis not present

## 2015-08-29 DIAGNOSIS — N3289 Other specified disorders of bladder: Secondary | ICD-10-CM | POA: Diagnosis not present

## 2015-08-29 DIAGNOSIS — R300 Dysuria: Secondary | ICD-10-CM | POA: Diagnosis not present

## 2015-08-29 DIAGNOSIS — N3090 Cystitis, unspecified without hematuria: Secondary | ICD-10-CM | POA: Diagnosis not present

## 2015-08-30 DIAGNOSIS — R253 Fasciculation: Secondary | ICD-10-CM | POA: Diagnosis not present

## 2015-08-30 DIAGNOSIS — E1142 Type 2 diabetes mellitus with diabetic polyneuropathy: Secondary | ICD-10-CM | POA: Diagnosis not present

## 2015-09-06 DIAGNOSIS — G2581 Restless legs syndrome: Secondary | ICD-10-CM | POA: Diagnosis not present

## 2015-09-06 DIAGNOSIS — I480 Paroxysmal atrial fibrillation: Secondary | ICD-10-CM | POA: Diagnosis not present

## 2015-09-06 DIAGNOSIS — E1142 Type 2 diabetes mellitus with diabetic polyneuropathy: Secondary | ICD-10-CM | POA: Diagnosis not present

## 2015-09-06 DIAGNOSIS — R253 Fasciculation: Secondary | ICD-10-CM | POA: Diagnosis not present

## 2015-09-09 DIAGNOSIS — K5900 Constipation, unspecified: Secondary | ICD-10-CM | POA: Diagnosis not present

## 2015-09-09 DIAGNOSIS — N390 Urinary tract infection, site not specified: Secondary | ICD-10-CM | POA: Diagnosis not present

## 2015-09-09 DIAGNOSIS — N3289 Other specified disorders of bladder: Secondary | ICD-10-CM | POA: Diagnosis not present

## 2015-09-14 ENCOUNTER — Ambulatory Visit (INDEPENDENT_AMBULATORY_CARE_PROVIDER_SITE_OTHER): Payer: Medicare Other | Admitting: Sports Medicine

## 2015-09-14 ENCOUNTER — Encounter: Payer: Self-pay | Admitting: Sports Medicine

## 2015-09-14 VITALS — BP 163/72 | HR 66 | Resp 14

## 2015-09-14 DIAGNOSIS — M71571 Other bursitis, not elsewhere classified, right ankle and foot: Secondary | ICD-10-CM

## 2015-09-14 DIAGNOSIS — M21621 Bunionette of right foot: Secondary | ICD-10-CM

## 2015-09-14 DIAGNOSIS — M7751 Other enthesopathy of right foot: Secondary | ICD-10-CM

## 2015-09-14 MED ORDER — TRIAMCINOLONE ACETONIDE 10 MG/ML IJ SUSP: 10.0000 mg | Freq: Once | INTRAMUSCULAR | Status: DC

## 2015-09-14 NOTE — Progress Notes (Deleted)
   Subjective:    Patient ID: Heather [name], female    DOB: December 31, 1945, 69 y.o.   MRN: [id number]  HPI Patient presents here today with right foot 5th toe pain(2 years) when wearing shoes and a knot (4 years) on the side of the foot. No treatments noted.   Review of Systems  All other systems reviewed and are negative.      Objective:   Physical Exam        Assessment & Plan:

## 2015-09-14 NOTE — Progress Notes (Signed)
Patient ID: Heather [name], female   DOB: 1946-06-16, 69 y.o.   MRN: [id number]  Subjective: Heather [name] is a 69 y.o. female patient who presents to office for evaluation of Rightt foot pain. Patient complains of progressive pain especially over the last 2 years in the Right foot at the 5th toe; reports that there is a painful nodule there that gets red and sometimes feels inflamed. Area is irritated with shoes to the point where she is considering making a cut out in her shoe. Ranks pain 5/10 and is now interferring with daily activities like walking because can only find bedroom shoes or SAS sneakers most comfortable. Patient has tried changing shoes with no relief in symptoms.  Patient is also diabetic. FBS this morning 234m/dl. Diagnosed 35 years ago with no acute problems. Denies changes in medication. Denies new numbness, tingling, burning in feet. Patient denies any other pedal complaints.   Patient Active Problem List   Diagnosis Date Noted  . Atrial fibrillation ([place] 08/14/2014  . Type II or unspecified type diabetes mellitus with peripheral circulatory disorders, uncontrolled(250.72) 05/15/2013  . Pains, foot 05/15/2013  . Diabetic peripheral neuropathy ([place] 05/15/2013  . Morbid obesity ([place] 05/15/2013  . Bilateral leg edema 05/15/2013  . Edema of foot 05/15/2013  . Osteoarthritis   . Proliferative diabetic retinopathy of both eyes ([place]   . Bell's palsy     Current Outpatient Prescriptions on File Prior to Visit  Medication Sig Dispense Refill  . acetaminophen (TYLENOL) 500 MG tablet Take 500 mg by mouth every 6 (six) hours as needed.    .Marland Kitchenapixaban (ELIQUIS) 5 MG TABS tablet Take 1 tablet (5 mg total) by mouth 2 (two) times daily. 60 tablet 5  . aspirin 81 MG tablet Take 81 mg by mouth every evening.    .Marland Kitchenatorvastatin (LIPITOR) 80 MG tablet Take 80 mg by mouth daily.     . Cholecalciferol 2000 UNITS TABS Take 2,000 Units by mouth daily.     .Marland Kitchendiltiazem (CARDIZEM SR) 120 MG 12  hr capsule Take 1 capsule (120 mg total) by mouth every 12 (twelve) hours. 4 capsule 0  . DULoxetine (CYMBALTA) 30 MG capsule Take 30 mg by mouth daily.    . INVOKANA 100 MG TABS Take 100 mg by mouth daily before breakfast.     . lisinopril (PRINIVIL,ZESTRIL) 5 MG tablet Take 5 mg by mouth daily.    . Multiple Vitamins-Minerals (WOMENS MULTIVITAMIN PLUS PO) Take 1 tablet by mouth daily.     . pantoprazole (PROTONIX) 40 MG tablet Take 40 mg by mouth daily.     .Marland KitchenrOPINIRole (REQUIP) 1 MG tablet Take 4 mg by mouth daily.     .Marland KitchentiZANidine (ZANAFLEX) 4 MG tablet Take 8 mg by mouth every 8 (eight) hours as needed (Pain).     . traMADol (ULTRAM) 50 MG tablet Take 50-100 mg by mouth every 8 (eight) hours as needed for moderate pain.     No current facility-administered medications on file prior to visit.   Allergies  Allergen Reactions  . Sulfa Antibiotics     srtong itch reaction     Objective:  General: Alert and oriented x3 in no acute distress  Dermatology: No open lesions bilateral lower extremities, no webspace macerations, no ecchymosis bilateral, all nails x 10 are well manicured. Right lateral 5th MTPJ soft tissue fullness with secondary bunion deformity with surrounding erythema consist with bursitis/capsulitis.   Vascular: Dorsalis Pedis and Posterior Tibial pedal pulses 1/4,  Capillary Fill Time 3 seconds, Diminished pedal hair growth bilateral, no edema bilateral lower extremities, Temperature gradient within normal limits.  Neurology: Gross sensation intact via light touch bilateral, Protective sensation intact  with Thornell Mule Monofilament to all pedal sites, Vibratory sensation with tuning fork absent. No babinski sign present bilateral.   Musculoskeletal: Mild tenderness with palpation at lateral 5th MTPJ on Right with 5th met head prominence consistent with tailors bunion deformity, No pain with MTPJ range of motion, midtarsal,  Subtalar joint, ankle joint range of motion  is within normal limits with no pain, crepitus, or limitation.         Assessment and Plan: Problem List Items Addressed This Visit    None    Visit Diagnoses    Tailor's bunion of right foot    -  Primary    Bursitis of foot, right        Relevant Medications    triamcinolone acetonide (KENALOG) 10 MG/ML injection 10 mg      -Complete examination performed -Discussed treatement options; risks, benefits, and alterantives -Discussed in detail foot type and tailors bunion contributing to bursa/capsulitis right 5th MTPJ -After verbal consent injected right 5th MTPJ bursa with 1cc lidocaine plain, 1cc marcaine plain, 0.5cc dex phosphate, 0.5cc kenalog 10 without incident -Dispensed offloading padding and educated on use -Recommend wider/appropriate fitting shoes -Continue with plastizote inserts -Recommend if symptoms reoccurs or worsen; repeat xray and reconsider treatment options -Patient to continue neurontin as rx by neurologist  -Patient to return to office as needed or sooner if condition worsens.  Landis Martins, DPM

## 2015-10-07 DIAGNOSIS — N3289 Other specified disorders of bladder: Secondary | ICD-10-CM | POA: Diagnosis not present

## 2015-10-07 DIAGNOSIS — N3080 Other cystitis without hematuria: Secondary | ICD-10-CM | POA: Diagnosis not present

## 2015-10-07 DIAGNOSIS — N3091 Cystitis, unspecified with hematuria: Secondary | ICD-10-CM | POA: Diagnosis not present

## 2015-10-19 DIAGNOSIS — Z23 Encounter for immunization: Secondary | ICD-10-CM | POA: Diagnosis not present

## 2015-10-19 DIAGNOSIS — E1129 Type 2 diabetes mellitus with other diabetic kidney complication: Secondary | ICD-10-CM | POA: Diagnosis not present

## 2015-10-19 DIAGNOSIS — E11319 Type 2 diabetes mellitus with unspecified diabetic retinopathy without macular edema: Secondary | ICD-10-CM | POA: Diagnosis not present

## 2015-11-21 DIAGNOSIS — Z853 Personal history of malignant neoplasm of breast: Secondary | ICD-10-CM | POA: Diagnosis not present

## 2015-11-21 DIAGNOSIS — Z1231 Encounter for screening mammogram for malignant neoplasm of breast: Secondary | ICD-10-CM | POA: Diagnosis not present

## 2015-11-23 ENCOUNTER — Ambulatory Visit: Payer: Medicare Other | Admitting: *Deleted

## 2015-11-23 DIAGNOSIS — R52 Pain, unspecified: Secondary | ICD-10-CM

## 2015-11-23 DIAGNOSIS — N3090 Cystitis, unspecified without hematuria: Secondary | ICD-10-CM | POA: Diagnosis not present

## 2015-11-23 NOTE — Progress Notes (Signed)
Patient ID: Heather [name], female   DOB: 1946-01-05, 70 y.o.   MRN: [id number] Patient presents to be scanned and measured for diabetic shoes and inserts.

## 2015-12-07 ENCOUNTER — Telehealth: Payer: Self-pay | Admitting: Sports Medicine

## 2015-12-07 NOTE — Telephone Encounter (Signed)
lvm for pt to call back to schedule an appt to pick up Diabetic shoes in [place] office  this afternoon after 2 per Melody.

## 2015-12-14 ENCOUNTER — Ambulatory Visit: Payer: Medicare Other | Admitting: *Deleted

## 2015-12-14 DIAGNOSIS — R52 Pain, unspecified: Secondary | ICD-10-CM

## 2015-12-14 NOTE — Patient Instructions (Signed)

## 2015-12-14 NOTE — Progress Notes (Signed)
Patient ID: Heather [name], female   DOB: 12/26/1945, 70 y.o.   MRN: [id number] Patient presents for diabetic shoe pick up.  Shoes are tried on and are not a good fit will reorder and call when they arrive.

## 2015-12-21 DIAGNOSIS — Z23 Encounter for immunization: Secondary | ICD-10-CM | POA: Diagnosis not present

## 2015-12-26 DIAGNOSIS — R351 Nocturia: Secondary | ICD-10-CM | POA: Diagnosis not present

## 2015-12-26 DIAGNOSIS — G4733 Obstructive sleep apnea (adult) (pediatric): Secondary | ICD-10-CM | POA: Diagnosis not present

## 2015-12-26 DIAGNOSIS — G2581 Restless legs syndrome: Secondary | ICD-10-CM | POA: Diagnosis not present

## 2015-12-27 DIAGNOSIS — Z6832 Body mass index (BMI) 32.0-32.9, adult: Secondary | ICD-10-CM | POA: Diagnosis not present

## 2015-12-27 DIAGNOSIS — D0511 Intraductal carcinoma in situ of right breast: Secondary | ICD-10-CM | POA: Diagnosis not present

## 2015-12-27 DIAGNOSIS — E669 Obesity, unspecified: Secondary | ICD-10-CM | POA: Diagnosis not present

## 2015-12-29 DIAGNOSIS — N3281 Overactive bladder: Secondary | ICD-10-CM | POA: Diagnosis not present

## 2015-12-29 DIAGNOSIS — R21 Rash and other nonspecific skin eruption: Secondary | ICD-10-CM | POA: Diagnosis not present

## 2015-12-29 DIAGNOSIS — N3090 Cystitis, unspecified without hematuria: Secondary | ICD-10-CM | POA: Diagnosis not present

## 2016-01-11 DIAGNOSIS — Y33XXXA Other specified events, undetermined intent, initial encounter: Secondary | ICD-10-CM | POA: Diagnosis not present

## 2016-01-11 DIAGNOSIS — I4891 Unspecified atrial fibrillation: Secondary | ICD-10-CM | POA: Diagnosis not present

## 2016-01-11 DIAGNOSIS — I10 Essential (primary) hypertension: Secondary | ICD-10-CM | POA: Diagnosis not present

## 2016-01-11 DIAGNOSIS — G8911 Acute pain due to trauma: Secondary | ICD-10-CM | POA: Diagnosis not present

## 2016-01-11 DIAGNOSIS — E119 Type 2 diabetes mellitus without complications: Secondary | ICD-10-CM | POA: Diagnosis not present

## 2016-01-11 DIAGNOSIS — Z794 Long term (current) use of insulin: Secondary | ICD-10-CM | POA: Diagnosis not present

## 2016-01-11 DIAGNOSIS — R52 Pain, unspecified: Secondary | ICD-10-CM | POA: Diagnosis not present

## 2016-01-11 DIAGNOSIS — S82842A Displaced bimalleolar fracture of left lower leg, initial encounter for closed fracture: Secondary | ICD-10-CM | POA: Diagnosis not present

## 2016-01-11 DIAGNOSIS — M25579 Pain in unspecified ankle and joints of unspecified foot: Secondary | ICD-10-CM | POA: Diagnosis not present

## 2016-01-16 DIAGNOSIS — S82842A Displaced bimalleolar fracture of left lower leg, initial encounter for closed fracture: Secondary | ICD-10-CM | POA: Diagnosis not present

## 2016-01-17 ENCOUNTER — Encounter (HOSPITAL_BASED_OUTPATIENT_CLINIC_OR_DEPARTMENT_OTHER): Payer: Self-pay | Admitting: *Deleted

## 2016-01-18 ENCOUNTER — Other Ambulatory Visit: Payer: Self-pay | Admitting: Orthopedic Surgery

## 2016-01-19 ENCOUNTER — Ambulatory Visit (HOSPITAL_BASED_OUTPATIENT_CLINIC_OR_DEPARTMENT_OTHER)
Admission: RE | Admit: 2016-01-19 | Discharge: 2016-01-19 | Disposition: A | Discharge status: Home / Self Care | Payer: Medicare Other | Source: Ambulatory Visit | Attending: Orthopedic Surgery | Admitting: Orthopedic Surgery

## 2016-01-19 ENCOUNTER — Ambulatory Visit (HOSPITAL_BASED_OUTPATIENT_CLINIC_OR_DEPARTMENT_OTHER): Payer: Medicare Other | Admitting: Certified Registered"

## 2016-01-19 ENCOUNTER — Encounter (HOSPITAL_BASED_OUTPATIENT_CLINIC_OR_DEPARTMENT_OTHER)
Admission: RE | Disposition: A | Discharge status: Home / Self Care | Payer: Self-pay | Source: Ambulatory Visit | Attending: Orthopedic Surgery

## 2016-01-19 ENCOUNTER — Encounter (HOSPITAL_BASED_OUTPATIENT_CLINIC_OR_DEPARTMENT_OTHER): Payer: Self-pay

## 2016-01-19 DIAGNOSIS — E113593 Type 2 diabetes mellitus with proliferative diabetic retinopathy without macular edema, bilateral: Secondary | ICD-10-CM | POA: Diagnosis not present

## 2016-01-19 DIAGNOSIS — Z96642 Presence of left artificial hip joint: Secondary | ICD-10-CM | POA: Diagnosis not present

## 2016-01-19 DIAGNOSIS — K219 Gastro-esophageal reflux disease without esophagitis: Secondary | ICD-10-CM | POA: Insufficient documentation

## 2016-01-19 DIAGNOSIS — Z90710 Acquired absence of both cervix and uterus: Secondary | ICD-10-CM | POA: Insufficient documentation

## 2016-01-19 DIAGNOSIS — G2581 Restless legs syndrome: Secondary | ICD-10-CM | POA: Diagnosis not present

## 2016-01-19 DIAGNOSIS — I4891 Unspecified atrial fibrillation: Secondary | ICD-10-CM | POA: Diagnosis not present

## 2016-01-19 DIAGNOSIS — X58XXXA Exposure to other specified factors, initial encounter: Secondary | ICD-10-CM | POA: Insufficient documentation

## 2016-01-19 DIAGNOSIS — R9431 Abnormal electrocardiogram [ECG] [EKG]: Secondary | ICD-10-CM | POA: Diagnosis not present

## 2016-01-19 DIAGNOSIS — Z96653 Presence of artificial knee joint, bilateral: Secondary | ICD-10-CM | POA: Diagnosis not present

## 2016-01-19 DIAGNOSIS — G4730 Sleep apnea, unspecified: Secondary | ICD-10-CM | POA: Insufficient documentation

## 2016-01-19 DIAGNOSIS — Z853 Personal history of malignant neoplasm of breast: Secondary | ICD-10-CM | POA: Diagnosis not present

## 2016-01-19 DIAGNOSIS — S82842A Displaced bimalleolar fracture of left lower leg, initial encounter for closed fracture: Secondary | ICD-10-CM | POA: Insufficient documentation

## 2016-01-19 DIAGNOSIS — M1990 Unspecified osteoarthritis, unspecified site: Secondary | ICD-10-CM | POA: Diagnosis not present

## 2016-01-19 DIAGNOSIS — G8918 Other acute postprocedural pain: Secondary | ICD-10-CM | POA: Diagnosis not present

## 2016-01-19 DIAGNOSIS — Z79899 Other long term (current) drug therapy: Secondary | ICD-10-CM | POA: Diagnosis not present

## 2016-01-19 DIAGNOSIS — Z7901 Long term (current) use of anticoagulants: Secondary | ICD-10-CM | POA: Diagnosis not present

## 2016-01-19 DIAGNOSIS — I739 Peripheral vascular disease, unspecified: Secondary | ICD-10-CM | POA: Insufficient documentation

## 2016-01-19 DIAGNOSIS — M25572 Pain in left ankle and joints of left foot: Secondary | ICD-10-CM | POA: Diagnosis not present

## 2016-01-19 HISTORY — DX: Gastro-esophageal reflux disease without esophagitis: K21.9

## 2016-01-19 HISTORY — DX: Cardiac arrhythmia, unspecified: I49.9

## 2016-01-19 HISTORY — PX: ORIF ANKLE FRACTURE: SHX5408

## 2016-01-19 HISTORY — DX: Displaced bimalleolar fracture of left lower leg, initial encounter for closed fracture: S82.842A

## 2016-01-19 HISTORY — DX: Sleep apnea, unspecified: G47.30

## 2016-01-19 LAB — GLUCOSE, CAPILLARY
GLUCOSE-CAPILLARY: 116 mg/dL — AB (ref 65–99)
GLUCOSE-CAPILLARY: 83 mg/dL (ref 65–99)

## 2016-01-19 SURGERY — OPEN REDUCTION INTERNAL FIXATION (ORIF) ANKLE FRACTURE
Anesthesia: General | Site: Ankle | Laterality: Left

## 2016-01-19 MED ORDER — LIDOCAINE HCL (CARDIAC) 20 MG/ML IV SOLN
INTRAVENOUS | Status: AC
  Filled 2016-01-19: qty 5

## 2016-01-19 MED ORDER — ONDANSETRON HCL 4 MG/2ML IJ SOLN
INTRAMUSCULAR | Status: DC | PRN
  Administered 2016-01-19: 4 mg via INTRAVENOUS

## 2016-01-19 MED ORDER — FENTANYL CITRATE (PF) 100 MCG/2ML IJ SOLN
INTRAMUSCULAR | Status: AC
  Filled 2016-01-19: qty 2

## 2016-01-19 MED ORDER — SODIUM CHLORIDE 0.9 % IV SOLN: INTRAVENOUS | Status: DC

## 2016-01-19 MED ORDER — DEXAMETHASONE SODIUM PHOSPHATE 10 MG/ML IJ SOLN
INTRAMUSCULAR | Status: AC
  Filled 2016-01-19: qty 1

## 2016-01-19 MED ORDER — MIDAZOLAM HCL 2 MG/2ML IJ SOLN
INTRAMUSCULAR | Status: AC
  Filled 2016-01-19: qty 2

## 2016-01-19 MED ORDER — PROPOFOL 10 MG/ML IV BOLUS
INTRAVENOUS | Status: DC | PRN
  Administered 2016-01-19: 150 mg via INTRAVENOUS

## 2016-01-19 MED ORDER — OXYCODONE HCL 5 MG PO TABS: 5.0000 mg | ORAL_TABLET | ORAL | Status: DC | PRN

## 2016-01-19 MED ORDER — ACETAMINOPHEN 10 MG/ML IV SOLN
INTRAVENOUS | Status: AC
  Filled 2016-01-19: qty 100

## 2016-01-19 MED ORDER — 0.9 % SODIUM CHLORIDE (POUR BTL) OPTIME
TOPICAL | Status: DC | PRN
  Administered 2016-01-19: 200 mL

## 2016-01-19 MED ORDER — BUPIVACAINE-EPINEPHRINE (PF) 0.5% -1:200000 IJ SOLN
INTRAMUSCULAR | Status: DC | PRN
  Administered 2016-01-19 (×2): 10 mL via PERINEURAL

## 2016-01-19 MED ORDER — VANCOMYCIN HCL 500 MG IV SOLR
INTRAVENOUS | Status: AC
  Filled 2016-01-19: qty 500

## 2016-01-19 MED ORDER — OXYCODONE HCL 5 MG PO TABS
ORAL_TABLET | ORAL | Status: AC
  Filled 2016-01-19: qty 1

## 2016-01-19 MED ORDER — HYDROMORPHONE HCL 1 MG/ML IJ SOLN
INTRAMUSCULAR | Status: AC
  Filled 2016-01-19: qty 1

## 2016-01-19 MED ORDER — FENTANYL CITRATE (PF) 100 MCG/2ML IJ SOLN
INTRAMUSCULAR | Status: AC
Start: 2016-01-19 — End: 2016-01-19
  Filled 2016-01-19: qty 2

## 2016-01-19 MED ORDER — FENTANYL CITRATE (PF) 100 MCG/2ML IJ SOLN
50.0000 ug | INTRAMUSCULAR | Status: DC | PRN
  Administered 2016-01-19: 100 ug via INTRAVENOUS
  Administered 2016-01-19: 50 ug via INTRAVENOUS

## 2016-01-19 MED ORDER — VANCOMYCIN HCL 500 MG IV SOLR
INTRAVENOUS | Status: DC | PRN
  Administered 2016-01-19: 500 mg via TOPICAL

## 2016-01-19 MED ORDER — ACETAMINOPHEN 10 MG/ML IV SOLN
1000.0000 mg | Freq: Once | INTRAVENOUS | Status: AC
  Administered 2016-01-19: 1000 mg via INTRAVENOUS

## 2016-01-19 MED ORDER — SCOPOLAMINE 1 MG/3DAYS TD PT72: 1.0000 | MEDICATED_PATCH | Freq: Once | TRANSDERMAL | Status: DC | PRN

## 2016-01-19 MED ORDER — MEPERIDINE HCL 25 MG/ML IJ SOLN: 6.2500 mg | INTRAMUSCULAR | Status: DC | PRN

## 2016-01-19 MED ORDER — MIDAZOLAM HCL 2 MG/2ML IJ SOLN
1.0000 mg | INTRAMUSCULAR | Status: DC | PRN
  Administered 2016-01-19: 2 mg via INTRAVENOUS

## 2016-01-19 MED ORDER — PROPOFOL 10 MG/ML IV BOLUS
INTRAVENOUS | Status: AC
  Filled 2016-01-19: qty 20

## 2016-01-19 MED ORDER — CHLORHEXIDINE GLUCONATE 4 % EX LIQD: 60.0000 mL | Freq: Once | CUTANEOUS | Status: DC

## 2016-01-19 MED ORDER — OXYCODONE HCL 5 MG PO TABS
5.0000 mg | ORAL_TABLET | Freq: Once | ORAL | Status: AC | PRN
  Administered 2016-01-19: 5 mg via ORAL

## 2016-01-19 MED ORDER — LIDOCAINE HCL (CARDIAC) 20 MG/ML IV SOLN
INTRAVENOUS | Status: DC | PRN
  Administered 2016-01-19: 30 mg via INTRAVENOUS

## 2016-01-19 MED ORDER — ACETAMINOPHEN 10 MG/ML IV SOLN: 1000.0000 mg | Freq: Four times a day (QID) | INTRAVENOUS | Status: DC

## 2016-01-19 MED ORDER — OXYCODONE HCL 5 MG/5ML PO SOLN: 5.0000 mg | Freq: Once | ORAL | Status: AC | PRN

## 2016-01-19 MED ORDER — DEXAMETHASONE SODIUM PHOSPHATE 10 MG/ML IJ SOLN
INTRAMUSCULAR | Status: DC | PRN
  Administered 2016-01-19: 4 mg via INTRAVENOUS

## 2016-01-19 MED ORDER — ROPIVACAINE HCL 5 MG/ML IJ SOLN
INTRAMUSCULAR | Status: DC | PRN
  Administered 2016-01-19 (×2): 10 mL via PERINEURAL

## 2016-01-19 MED ORDER — ONDANSETRON HCL 4 MG/2ML IJ SOLN
INTRAMUSCULAR | Status: AC
  Filled 2016-01-19: qty 2

## 2016-01-19 MED ORDER — CEFAZOLIN SODIUM-DEXTROSE 2-3 GM-% IV SOLR
INTRAVENOUS | Status: AC
  Filled 2016-01-19: qty 50

## 2016-01-19 MED ORDER — CEFAZOLIN SODIUM-DEXTROSE 2-3 GM-% IV SOLR
2.0000 g | INTRAVENOUS | Status: AC
  Administered 2016-01-19: 2 g via INTRAVENOUS

## 2016-01-19 MED ORDER — LACTATED RINGERS IV SOLN
INTRAVENOUS | Status: DC
  Administered 2016-01-19: 12:00:00 via INTRAVENOUS
  Administered 2016-01-19: 10 mL/h via INTRAVENOUS

## 2016-01-19 MED ORDER — PROMETHAZINE HCL 25 MG/ML IJ SOLN: 6.2500 mg | INTRAMUSCULAR | Status: DC | PRN

## 2016-01-19 MED ORDER — GLYCOPYRROLATE 0.2 MG/ML IJ SOLN: 0.2000 mg | Freq: Once | INTRAMUSCULAR | Status: DC | PRN

## 2016-01-19 MED ORDER — DEXTROSE 50 % IV SOLN
INTRAVENOUS | Status: AC
  Filled 2016-01-19: qty 50

## 2016-01-19 MED ORDER — HYDROMORPHONE HCL 1 MG/ML IJ SOLN
0.2500 mg | INTRAMUSCULAR | Status: DC | PRN
  Administered 2016-01-19 (×3): 0.5 mg via INTRAVENOUS

## 2016-01-19 MED ORDER — DEXTROSE 50 % IV SOLN
25.0000 mL | Freq: Once | INTRAVENOUS | Status: AC
  Administered 2016-01-19: 25 mL via INTRAVENOUS

## 2016-01-19 SURGICAL SUPPLY — 81 items
BANDAGE ESMARK 6X9 LF (GAUZE/BANDAGES/DRESSINGS) ×1 IMPLANT
BIT DRILL 2.5X2.75 QC CALB (BIT) ×3 IMPLANT
BIT DRILL 2.9 CANN QC NONSTRL (BIT) ×3 IMPLANT
BLADE SURG 15 STRL LF DISP TIS (BLADE) ×2 IMPLANT
BLADE SURG 15 STRL SS (BLADE) ×4
BNDG COHESIVE 4X5 TAN STRL (GAUZE/BANDAGES/DRESSINGS) ×3 IMPLANT
BNDG COHESIVE 6X5 TAN STRL LF (GAUZE/BANDAGES/DRESSINGS) ×3 IMPLANT
BNDG ESMARK 4X9 LF (GAUZE/BANDAGES/DRESSINGS) IMPLANT
BNDG ESMARK 6X9 LF (GAUZE/BANDAGES/DRESSINGS) ×3
CANISTER SUCT 1200ML W/VALVE (MISCELLANEOUS) ×3 IMPLANT
CHLORAPREP W/TINT 26ML (MISCELLANEOUS) ×3 IMPLANT
COVER BACK TABLE 60X90IN (DRAPES) ×3 IMPLANT
CUFF TOURNIQUET SINGLE 34IN LL (TOURNIQUET CUFF) ×3 IMPLANT
DECANTER SPIKE VIAL GLASS SM (MISCELLANEOUS) IMPLANT
DRAPE EXTREMITY T 121X128X90 (DRAPE) ×3 IMPLANT
DRAPE OEC MINIVIEW 54X84 (DRAPES) ×3 IMPLANT
DRAPE U-SHAPE 47X51 STRL (DRAPES) ×3 IMPLANT
DRSG MEPITEL 4X7.2 (GAUZE/BANDAGES/DRESSINGS) ×3 IMPLANT
DRSG PAD ABDOMINAL 8X10 ST (GAUZE/BANDAGES/DRESSINGS) ×6 IMPLANT
ELECT REM PT RETURN 9FT ADLT (ELECTROSURGICAL) ×3
ELECTRODE REM PT RTRN 9FT ADLT (ELECTROSURGICAL) ×1 IMPLANT
GAUZE SPONGE 4X4 12PLY STRL (GAUZE/BANDAGES/DRESSINGS) ×3 IMPLANT
GLOVE BIO SURGEON STRL SZ8 (GLOVE) ×3 IMPLANT
GLOVE BIOGEL PI IND STRL 7.0 (GLOVE) ×1 IMPLANT
GLOVE BIOGEL PI IND STRL 7.5 (GLOVE) ×1 IMPLANT
GLOVE BIOGEL PI IND STRL 8 (GLOVE) ×2 IMPLANT
GLOVE BIOGEL PI INDICATOR 7.0 (GLOVE) ×2
GLOVE BIOGEL PI INDICATOR 7.5 (GLOVE) ×2
GLOVE BIOGEL PI INDICATOR 8 (GLOVE) ×4
GLOVE ECLIPSE 6.5 STRL STRAW (GLOVE) ×3 IMPLANT
GLOVE ECLIPSE 7.5 STRL STRAW (GLOVE) ×3 IMPLANT
GLOVE EXAM NITRILE MD LF STRL (GLOVE) IMPLANT
GLOVE SURG SS PI 7.0 STRL IVOR (GLOVE) ×3 IMPLANT
GOWN STRL REUS W/ TWL LRG LVL3 (GOWN DISPOSABLE) ×1 IMPLANT
GOWN STRL REUS W/ TWL XL LVL3 (GOWN DISPOSABLE) ×2 IMPLANT
GOWN STRL REUS W/TWL LRG LVL3 (GOWN DISPOSABLE) ×2
GOWN STRL REUS W/TWL XL LVL3 (GOWN DISPOSABLE) ×4
K-WIRE ACE 1.6X6 (WIRE) ×6
KWIRE ACE 1.6X6 (WIRE) ×2 IMPLANT
NEEDLE HYPO 22GX1.5 SAFETY (NEEDLE) IMPLANT
NS IRRIG 1000ML POUR BTL (IV SOLUTION) ×3 IMPLANT
PACK BASIN DAY SURGERY FS (CUSTOM PROCEDURE TRAY) ×3 IMPLANT
PAD CAST 4YDX4 CTTN HI CHSV (CAST SUPPLIES) ×1 IMPLANT
PADDING CAST ABS 4INX4YD NS (CAST SUPPLIES)
PADDING CAST ABS COTTON 4X4 ST (CAST SUPPLIES) IMPLANT
PADDING CAST COTTON 4X4 STRL (CAST SUPPLIES) ×2
PADDING CAST COTTON 6X4 STRL (CAST SUPPLIES) ×3 IMPLANT
PENCIL BUTTON HOLSTER BLD 10FT (ELECTRODE) ×3 IMPLANT
PLATE TUB 100DEG 5 HO (Plate) ×3 IMPLANT
SANITIZER HAND PURELL 535ML FO (MISCELLANEOUS) ×3 IMPLANT
SCREW ACE CAN 4.0 40M (Screw) ×3 IMPLANT
SCREW CORTICAL 3.5MM  16MM (Screw) ×2 IMPLANT
SCREW CORTICAL 3.5MM  20MM (Screw) ×2 IMPLANT
SCREW CORTICAL 3.5MM  28MM (Screw) ×2 IMPLANT
SCREW CORTICAL 3.5MM 16MM (Screw) ×1 IMPLANT
SCREW CORTICAL 3.5MM 18MM (Screw) ×6 IMPLANT
SCREW CORTICAL 3.5MM 20MM (Screw) ×1 IMPLANT
SCREW CORTICAL 3.5MM 28MM (Screw) ×1 IMPLANT
SCREW LAG CANC FT 4X44 (Screw) ×3 IMPLANT
SHEET MEDIUM DRAPE 40X70 STRL (DRAPES) ×3 IMPLANT
SLEEVE SCD COMPRESS KNEE MED (MISCELLANEOUS) ×3 IMPLANT
SPLINT FAST PLASTER 5X30 (CAST SUPPLIES) ×40
SPLINT PLASTER CAST FAST 5X30 (CAST SUPPLIES) ×20 IMPLANT
SPONGE LAP 18X18 X RAY DECT (DISPOSABLE) ×3 IMPLANT
STOCKINETTE 6  STRL (DRAPES) ×2
STOCKINETTE 6 STRL (DRAPES) ×1 IMPLANT
SUCTION FRAZIER HANDLE 10FR (MISCELLANEOUS) ×2
SUCTION TUBE FRAZIER 10FR DISP (MISCELLANEOUS) ×1 IMPLANT
SUT ETHILON 3 0 PS 1 (SUTURE) ×3 IMPLANT
SUT FIBERWIRE #2 38 T-5 BLUE (SUTURE)
SUT MNCRL AB 3-0 PS2 18 (SUTURE) ×3 IMPLANT
SUT VIC AB 0 SH 27 (SUTURE) IMPLANT
SUT VIC AB 2-0 SH 27 (SUTURE) ×2
SUT VIC AB 2-0 SH 27XBRD (SUTURE) ×1 IMPLANT
SUTURE FIBERWR #2 38 T-5 BLUE (SUTURE) IMPLANT
SYR BULB 3OZ (MISCELLANEOUS) ×3 IMPLANT
SYR CONTROL 10ML LL (SYRINGE) IMPLANT
TOWEL OR 17X24 6PK STRL BLUE (TOWEL DISPOSABLE) ×6 IMPLANT
TUBE CONNECTING 20'X1/4 (TUBING) ×1
TUBE CONNECTING 20X1/4 (TUBING) ×2 IMPLANT
UNDERPAD 30X30 (UNDERPADS AND DIAPERS) ×3 IMPLANT

## 2016-01-19 NOTE — Transfer of Care (Signed)
Immediate Anesthesia Transfer of Care Note  Patient: Heather [name]  Procedure(s) Performed: Procedure(s): OPEN REDUCTION INTERNAL FIXATION (ORIF) LEFT ANKLE BIMALLEOLAR  FRACTURE (Left)  Patient Location: PACU  Anesthesia Type:GA combined with regional for post-op pain  Level of Consciousness: awake, alert , oriented and patient cooperative  Airway & Oxygen Therapy: Patient Spontanous Breathing and Patient connected to face mask oxygen  Post-op Assessment: Report given to RN and Post -op Vital signs reviewed and stable  Post vital signs: Reviewed and stable  Last Vitals:  Filed Vitals:   01/19/16 1127 01/19/16 1230  BP: 133/48 156/56  Pulse: 66   Temp: 36.8 C   Resp: 20     Complications: No apparent anesthesia complications

## 2016-01-19 NOTE — H&P (Signed)
Heather [name] is an 70 y.o. female.   Chief Complaint:  Left ankle fracture HPI:  70 y/o female with PMH of a fib fractured her ankle last week.  She has been off of her anticoagulant for a weeks.  She presents now for ORIF of her left unstable displaced ankle fracture.  Past Medical History  Diagnosis Date  . Diabetes mellitus, type II ([place])   . Restless legs 2012  . Osteoarthritis   . Proliferative diabetic retinopathy of both eyes ([place])   . Bell's palsy   . Breast cancer ([place])   . Low back pain with sciatica   . Atrial fibrillation ([place]) 08/14/2014  . Dysrhythmia     a-fib  . Sleep apnea     CPAP nightly  . GERD (gastroesophageal reflux disease)   . Bimalleolar fracture of left ankle     Past Surgical History  Procedure Laterality Date  . Both legs Bilateral 2006 and 2007  . Total hip arthroplasty  2009  . Both knees  2006  . R breast due to cancer  2011  . Vaginal hysterectomy    . Right knee replacement     . Left knee replacement    . Left hip replacement     . Repair of left foot fracture    . Right foot surgery     . Pin removal left foot    . Laser eye surgeries    . Cataractectomies    . Joint replacement      bil TKR and lt hip    Family History  Problem Relation Age of Onset  . Diabetes Father   . Heart disease Father   . Diabetes Paternal Aunt     3 aunts had DM  . Thyroid disease Maternal Grandmother   . Diabetes Maternal Grandfather    Social History:  reports that she has never smoked. She has never used smokeless tobacco. She reports that she does not drink alcohol or use illicit drugs.  Allergies:  Allergies  Allergen Reactions  . Sulfa Antibiotics Itching    Medications Prior to Admission  Medication Sig Dispense Refill  . acetaminophen (TYLENOL) 500 MG tablet Take 500 mg by mouth every 6 (six) hours as needed.    Marland Kitchen apixaban (ELIQUIS) 5 MG TABS tablet Take 1 tablet (5 mg total) by mouth 2 (two) times daily. 60 tablet 5  . atorvastatin  (LIPITOR) 80 MG tablet Take 80 mg by mouth daily.     . Cholecalciferol 2000 UNITS TABS Take 2,000 Units by mouth daily.     . digoxin (LANOXIN) 0.25 MG tablet Take 0.25 mg by mouth daily.    . DULoxetine (CYMBALTA) 30 MG capsule Take 30 mg by mouth daily.    . INVOKANA 100 MG TABS Take 100 mg by mouth daily before breakfast.     . lisinopril (PRINIVIL,ZESTRIL) 5 MG tablet Take 5 mg by mouth daily.    . metoprolol (LOPRESSOR) 50 MG tablet Take 50 mg by mouth 2 (two) times daily.    . Multiple Vitamins-Minerals (WOMENS MULTIVITAMIN PLUS PO) Take 1 tablet by mouth daily.     Marland Kitchen oxybutynin (DITROPAN) 5 MG tablet Take 5 mg by mouth 2 (two) times daily.    . pantoprazole (PROTONIX) 40 MG tablet Take 40 mg by mouth daily.     Marland Kitchen rOPINIRole (REQUIP) 1 MG tablet Take 4 mg by mouth daily.     Marland Kitchen tiZANidine (ZANAFLEX) 4 MG tablet Take 8 mg by  mouth every 8 (eight) hours as needed (Pain).     . traMADol (ULTRAM) 50 MG tablet Take 50-100 mg by mouth every 8 (eight) hours as needed for moderate pain.      No results found for this or any previous visit (from the past 48 hour(s)). No results found.  ROS  No recent f/c/n/v/wt loss  Blood pressure 133/48, pulse 66, temperature 98.2 F (36.8 C), resp. rate 20, height 5\' 7"  (1.702 m), weight 92.08 kg (203 lb), SpO2 95 %. Physical Exam  wn wd woman in nad.  A and O x 4.  Mood and affect normal.  EOMi.  resp unlabored.  L ankle with healthy and intact skin.  No lymphadenopathy.  5/5 strength in PF and DF of the toes.  2+ dp and pt pulses.  Normal sens to LT at the foot.  Assessment/Plan L ankle bimal fracture - to OR for ORIF.  The risks and benefits of the alternative treatment options have been discussed in detail.  The patient wishes to proceed with surgery and specifically understands risks of bleeding, infection, nerve damage, blood clots, need for additional surgery, amputation and death.   Wylene Simmer, MD 02-03-16, 12:21 PM

## 2016-01-19 NOTE — Progress Notes (Signed)
Assisted Dr. Al Corpus with left, ultrasound guided, popliteal/saphenous block. Side rails up, monitors on throughout procedure. See vital signs in flow sheet. Tolerated Procedure well.

## 2016-01-19 NOTE — Anesthesia Procedure Notes (Addendum)
Anesthesia Regional Block:  Popliteal block  Pre-Anesthetic Checklist: ,, timeout performed, Correct Patient, Correct Site, Correct Laterality, Correct Procedure, Correct Position, site marked, Risks and benefits discussed,  Surgical consent,  Pre-op evaluation,  At surgeon's request and post-op pain management  Laterality: Left and Lower  Prep: chloraprep       Needles:  Injection technique: Single-shot  Needle Type: Echogenic Needle     Needle Length: 9cm 9 cm Needle Gauge: 21 and 21 G    Additional Needles:  Procedures: ultrasound guided (picture in chart) Popliteal block Narrative:  Start time: 01/19/2016 12:44 PM End time: 01/19/2016 12:47 PM Injection made incrementally with aspirations every 5 mL.  Performed by: Personally  Anesthesiologist: CREWS, DAVID   Anesthesia Regional Block:  Adductor canal block  Pre-Anesthetic Checklist: ,, timeout performed, Correct Patient, Correct Site, Correct Laterality, Correct Procedure, Correct Position, site marked, Risks and benefits discussed,  Surgical consent,  Pre-op evaluation,  At surgeon's request and post-op pain management  Laterality: Left and Lower  Prep: chloraprep       Needles:  Injection technique: Single-shot  Needle Type: Echogenic Needle     Needle Length: 9cm 9 cm Needle Gauge: 21 and 21 G    Additional Needles:  Procedures: ultrasound guided (picture in chart) Adductor canal block Narrative:  Start time: 01/19/2016 12:47 PM End time: 01/19/2016 12:51 PM Injection made incrementally with aspirations every 5 mL.  Performed by: Personally  Anesthesiologist: CREWS, DAVID   Procedure Name: LMA Insertion Date/Time: 01/19/2016 1:14 PM Performed by: Sephira Zellman D Pre-anesthesia Checklist: Patient identified, Emergency Drugs available, Suction available and Patient being monitored Patient Re-evaluated:Patient Re-evaluated prior to inductionOxygen Delivery Method: Circle System Utilized Preoxygenation:  Pre-oxygenation with 100% oxygen Intubation Type: IV induction Ventilation: Mask ventilation without difficulty LMA: LMA inserted LMA Size: 4.0 Number of attempts: 1 Airway Equipment and Method: Bite block Placement Confirmation: positive ETCO2 Tube secured with: Tape Dental Injury: Teeth and Oropharynx as per pre-operative assessment      Left popliteal block image    Left saphenous image

## 2016-01-19 NOTE — Discharge Instructions (Signed)
Heather Simmer, MD [place]  Please read the following information regarding your care after surgery.  Medications  You only need a prescription for the narcotic pain medicine (ex. oxycodone, Percocet, Norco).  All of the other medicines listed below are available over the counter. X acetominophen (Tylenol) 650 mg every 4-6 hours as you need for minor pain X oxycodone as prescribed for moderate to severe pain X  You may resume your Eliquus on Saturday Feb. 11, 2017.   Narcotic pain medicine (ex. oxycodone, Percocet, Vicodin) will cause constipation.  To prevent this problem, take the following medicines while you are taking any pain medicine. X docusate sodium (Colace) 100 mg twice a day X senna (Senokot) 2 tablets twice a day  Weight Bearing ? Bear weight when you are able on your operated leg or foot. ? Bear weight only on the heel of your operated foot in the post-op shoe. X Do not bear any weight on the operated leg or foot.  Cast / Splint / Dressing X Keep your splint or cast clean and dry.  Dont put anything (coat hanger, pencil, etc) down inside of it.  If it gets damp, use a hair dryer on the cool setting to dry it.  If it gets soaked, call the office to schedule an appointment for a cast change. ? Remove your dressing 3 days after surgery and cover the incisions with dry dressings.    After your dressing, cast or splint is removed; you may shower, but do not soak or scrub the wound.  Allow the water to run over it, and then gently pat it dry.  Swelling It is normal for you to have swelling where you had surgery.  To reduce swelling and pain, keep your toes above your nose for at least 3 days after surgery.  It may be necessary to keep your foot or leg elevated for several weeks.  If it hurts, it should be elevated.  Follow Up Call my office at [phone] when you are discharged from the hospital or surgery center to schedule an appointment to be seen two weeks  after surgery.  Call my office at [phone] if you develop a fever >101.5 F, nausea, vomiting, bleeding from the surgical site or severe pain.     Post Anesthesia Home Care Instructions  Activity: Get plenty of rest for the remainder of the day. A responsible adult should stay with you for 24 hours following the procedure.  For the next 24 hours, DO NOT: -Drive a car -Paediatric nurse -Drink alcoholic beverages -Take any medication unless instructed by your physician -Make any legal decisions or sign important papers.  Meals: Start with liquid foods such as gelatin or soup. Progress to regular foods as tolerated. Avoid greasy, spicy, heavy foods. If nausea and/or vomiting occur, drink only clear liquids until the nausea and/or vomiting subsides. Call your physician if vomiting continues.  Special Instructions/Symptoms: Your throat may feel dry or sore from the anesthesia or the breathing tube placed in your throat during surgery. If this causes discomfort, gargle with warm salt water. The discomfort should disappear within 24 hours.  If you had a scopolamine patch placed behind your ear for the management of post- operative nausea and/or vomiting:  1. The medication in the patch is effective for 72 hours, after which it should be removed.  Wrap patch in a tissue and discard in the trash. Wash hands thoroughly with soap and water. 2. You may remove the patch earlier than  72 hours if you experience unpleasant side effects which may include dry mouth, dizziness or visual disturbances. 3. Avoid touching the patch. Wash your hands with soap and water after contact with the patch.   Regional Anesthesia Blocks  1. Numbness or the inability to move the "blocked" extremity may last from 3-48 hours after placement. The length of time depends on the medication injected and your individual response to the medication. If the numbness is not going away after 48 hours, call your surgeon.  2.  The extremity that is blocked will need to be protected until the numbness is gone and the  Strength has returned. Because you cannot feel it, you will need to take extra care to avoid injury. Because it may be weak, you may have difficulty moving it or using it. You may not know what position it is in without looking at it while the block is in effect.  3. For blocks in the legs and feet, returning to weight bearing and walking needs to be done carefully. You will need to wait until the numbness is entirely gone and the strength has returned. You should be able to move your leg and foot normally before you try and bear weight or walk. You will need someone to be with you when you first try to ensure you do not fall and possibly risk injury.  4. Bruising and tenderness at the needle site are common side effects and will resolve in a few days.  5. Persistent numbness or new problems with movement should be communicated to the surgeon or the [place] [phone] [place] [phone]).

## 2016-01-19 NOTE — Anesthesia Postprocedure Evaluation (Signed)
Anesthesia Post Note  Patient: Heather [name]  Procedure(s) Performed: Procedure(s) (LRB): OPEN REDUCTION INTERNAL FIXATION (ORIF) LEFT ANKLE BIMALLEOLAR  FRACTURE (Left)  Patient location during evaluation: PACU Anesthesia Type: General Level of consciousness: awake and alert Pain management: pain level controlled Vital Signs Assessment: post-procedure vital signs reviewed and stable Respiratory status: spontaneous breathing, nonlabored ventilation, respiratory function stable and patient connected to face mask oxygen Cardiovascular status: blood pressure returned to baseline and stable Postop Assessment: no signs of nausea or vomiting Anesthetic complications: no    Last Vitals:  Filed Vitals:   01/19/16 1445 01/19/16 1500  BP: 172/82 166/64  Pulse: 78 76  Temp:    Resp: 12 15    Last Pain:  Filed Vitals:   01/19/16 1505  PainSc: 2                  Ceylin Dreibelbis A

## 2016-01-19 NOTE — Brief Op Note (Signed)
01/19/2016  2:13 PM  PATIENT:  Heather [name]  70 y.o. female  PRE-OPERATIVE DIAGNOSIS:  LEFT ANKLE BIMALLEOLAR FRACTURE   POST-OPERATIVE DIAGNOSIS:  LEFT ANKLE BIMALLEOLAR FRACTURE   Procedure(s): 1.  OPEN REDUCTION INTERNAL FIXATION (ORIF) LEFT ANKLE BIMALLEOLAR  FRACTURE 2.  AP, mortise and lateral xrays of the left ankle  SURGEON:  Wylene Simmer, MD  ASSISTANT: n/a  ANESTHESIA:   General, regional  EBL:  minimal   TOURNIQUET:  < 1 hour at [id number] mm Hg  COMPLICATIONS:  None apparent  DISPOSITION:  Extubated, awake and stable to recovery.  DICTATION ID:  [id number]

## 2016-01-19 NOTE — Anesthesia Preprocedure Evaluation (Addendum)
Anesthesia Evaluation  Patient identified by MRN, date of birth, ID band Patient awake    Reviewed: Allergy & Precautions, NPO status , Patient's Chart, lab work & pertinent test results  Airway Mallampati: I  TM Distance: >3 FB Neck ROM: Full    Dental  (+) Teeth Intact, Dental Advisory Given   Pulmonary sleep apnea and Continuous Positive Airway Pressure Ventilation ,    breath sounds clear to auscultation       Cardiovascular Pt. on home beta blockers + Peripheral Vascular Disease  + dysrhythmias Atrial Fibrillation  Rhythm:Regular Rate:Normal     Neuro/Psych  Neuromuscular disease    GI/Hepatic   Endo/Other  diabetes, Well Controlled, Type 2Morbid obesity  Renal/GU      Musculoskeletal  (+) Arthritis ,   Abdominal   Peds  Hematology   Anesthesia Other Findings   Reproductive/Obstetrics                            Anesthesia Physical Anesthesia Plan  ASA: III  Anesthesia Plan: General   Post-op Pain Management: MAC Combined w/ Regional for Post-op pain   Induction: Intravenous  Airway Management Planned: LMA  Additional Equipment:   Intra-op Plan:   Post-operative Plan: Extubation in OR  Informed Consent: I have reviewed the patients History and Physical, chart, labs and discussed the procedure including the risks, benefits and alternatives for the proposed anesthesia with the patient or authorized representative who has indicated his/her understanding and acceptance.   Dental advisory given  Plan Discussed with: CRNA, Anesthesiologist and Surgeon  Anesthesia Plan Comments:        Anesthesia Quick Evaluation

## 2016-01-19 NOTE — Progress Notes (Signed)
Recvd report from [place], Rn to assume care of patient in Phase 2. Patient states pain 8/10 scale, no nausea.

## 2016-01-20 ENCOUNTER — Encounter (HOSPITAL_BASED_OUTPATIENT_CLINIC_OR_DEPARTMENT_OTHER): Payer: Self-pay | Admitting: Orthopedic Surgery

## 2016-01-20 NOTE — Op Note (Signed)
Heather [name], Heather [name]               ACCOUNT NO.:  [id number]  MEDICAL RECORD NO.:  [id number]  LOCATION:                               FACILITY:  [facility]  PHYSICIAN:  Wylene Simmer, MD        DATE OF BIRTH:  [birth year]  DATE OF PROCEDURE:  01/19/2016 DATE OF DISCHARGE:  01/19/2016                              OPERATIVE REPORT   PREOPERATIVE DIAGNOSIS:  Left ankle bimalleolar fracture.  POSTOPERATIVE DIAGNOSIS:  Left ankle bimalleolar fracture.  PROCEDURES: 1. Open reduction and internal fixation of left ankle bimalleolar     fracture. 2. AP, mortise and lateral radiographs of the left ankle.  SURGEON:  Wylene Simmer, MD  ANESTHESIA:  General, regional.  ESTIMATED BLOOD LOSS:  Minimal.  TOURNIQUET TIME:  Less than 1 hour at 250 mmHg.  COMPLICATIONS:  None apparent.  DISPOSITION:  Extubated, awake, and stable to recovery.  INDICATIONS FOR PROCEDURE:  The patient is a 70 year old female with past medical history significant for diabetes and atrial fibrillation. She fractured her ankle last week.  She has been off her Eliquis now for a week.  She presents for operative treatment of this unstable and displaced ankle fracture.  She understands the risks and benefits, the alternative treatment options, and elects surgical treatment.  She specifically understands the risks of bleeding, infection, nerve damage, blood clots, need for additional surgery, continued pain, nonunion, amputation and death.  PROCEDURE IN DETAIL:  After preoperative consent was obtained and the correct operative site was identified, the patient was brought to the operating room and placed supine on the operating table.  General anesthesia was induced.  Preoperative antibiotics were administered. Surgical time-out was taken.  The left lower extremity was prepped and draped in standard sterile fashion with the tourniquet around the thigh. The extremity was exsanguinated and the tourniquet was inflated to  250 mmHg.  At this point, longitudinal incision was made over the lateral malleolus.  Sharp dissection was carried down through the skin and subcutaneous tissue.  The fracture site was identified.  It was reduced. A 5-hole one-third tubular plate from the [employer] small frag set was then contoured to fit the lateral malleolus.  The fracture site was noted to be a transverse fracture not amenable to a lag screw.  The plate was applied and secured proximally with three bicortical screws and distally with two unicortical screws.  AP and lateral radiographs confirmed appropriate reduction of the fracture and appropriate position and length of all hardware.  Attention was then turned to the medial malleolus where a longitudinal incision was made.  Sharp dissection was carried down through the skin and subcutaneous tissue.  The fracture site was identified.  Fracture was reduced and pinned.  AP and lateral radiographs confirmed appropriate reduction of the fracture.  A 4-mm partially-threaded cannulated screw was inserted over the anterior guidepin.  It was noted to have excellent purchase and compressing the fracture site appropriately.  The posterior fracture site had some comminution, so the decision was made to put in a solid fully-threaded screw.  The posterior guidepin was overdrilled and guidepin was removed.  The hole was then utilized to insert the fully-threaded 4-mm  solid screw.  The screw had appropriate purchase.  Guidepins were removed.  AP, mortise and lateral radiographs confirmed appropriate reduction of the two fractures and appropriate position and length of all hardware.  Both wounds were irrigated copiously.  Vancomycin powder was sprinkled into the wounds due to the patient's history of diabetes.  Both wounds were then closed with Vicryl, Monocryl and nylon.  Sterile dressings were applied followed by compression wrap and a short-leg splint.  Tourniquet was released  after application of the dressings.  The patient was awakened from anesthesia and transported to the recovery room in stable condition.  FOLLOWUP PLAN:  The patient will be nonweightbearing on her left lower extremity.  She is going to follow up with me in the office in 2 weeks for suture removal and conversion to a short-leg cast.  She will resume her Eliquis in 2 days.     Wylene Simmer, MD     JH/MEDQ  D:  01/19/2016  T:  01/20/2016  Job:  [id number]

## 2016-02-01 DIAGNOSIS — S82842D Displaced bimalleolar fracture of left lower leg, subsequent encounter for closed fracture with routine healing: Secondary | ICD-10-CM | POA: Diagnosis not present

## 2016-02-01 DIAGNOSIS — Z4789 Encounter for other orthopedic aftercare: Secondary | ICD-10-CM | POA: Diagnosis not present

## 2016-02-06 LAB — POCT I-STAT, CHEM 8
BUN: 39 mg/dL — ABNORMAL HIGH (ref 6–20)
CALCIUM ION: 1.13 mmol/L (ref 1.13–1.30)
CREATININE: 1.8 mg/dL — AB (ref 0.44–1.00)
Chloride: 101 mmol/L (ref 101–111)
Glucose, Bld: 70 mg/dL (ref 65–99)
HEMATOCRIT: 40 % (ref 36.0–46.0)
HEMOGLOBIN: 13.6 g/dL (ref 12.0–15.0)
Potassium: 4.6 mmol/L (ref 3.5–5.1)
SODIUM: 144 mmol/L (ref 135–145)
TCO2: 26 mmol/L (ref 0–100)

## 2016-02-22 DIAGNOSIS — S82842D Displaced bimalleolar fracture of left lower leg, subsequent encounter for closed fracture with routine healing: Secondary | ICD-10-CM | POA: Diagnosis not present

## 2016-03-01 DIAGNOSIS — N3281 Overactive bladder: Secondary | ICD-10-CM | POA: Diagnosis not present

## 2016-03-01 DIAGNOSIS — N3090 Cystitis, unspecified without hematuria: Secondary | ICD-10-CM | POA: Diagnosis not present

## 2016-03-01 DIAGNOSIS — N390 Urinary tract infection, site not specified: Secondary | ICD-10-CM | POA: Diagnosis not present

## 2016-03-02 DIAGNOSIS — L02416 Cutaneous abscess of left lower limb: Secondary | ICD-10-CM | POA: Diagnosis not present

## 2016-03-21 ENCOUNTER — Other Ambulatory Visit: Payer: Medicare Other

## 2016-03-21 DIAGNOSIS — L84 Corns and callosities: Secondary | ICD-10-CM

## 2016-03-21 DIAGNOSIS — E1140 Type 2 diabetes mellitus with diabetic neuropathy, unspecified: Secondary | ICD-10-CM

## 2016-03-21 DIAGNOSIS — M2010 Hallux valgus (acquired), unspecified foot: Secondary | ICD-10-CM | POA: Diagnosis not present

## 2016-03-21 DIAGNOSIS — M2040 Other hammer toe(s) (acquired), unspecified foot: Secondary | ICD-10-CM

## 2016-03-22 DIAGNOSIS — Z1239 Encounter for other screening for malignant neoplasm of breast: Secondary | ICD-10-CM | POA: Diagnosis not present

## 2016-03-22 DIAGNOSIS — E1129 Type 2 diabetes mellitus with other diabetic kidney complication: Secondary | ICD-10-CM | POA: Diagnosis not present

## 2016-03-22 DIAGNOSIS — E7800 Pure hypercholesterolemia, unspecified: Secondary | ICD-10-CM | POA: Diagnosis not present

## 2016-03-22 DIAGNOSIS — F321 Major depressive disorder, single episode, moderate: Secondary | ICD-10-CM | POA: Diagnosis not present

## 2016-03-22 DIAGNOSIS — M810 Age-related osteoporosis without current pathological fracture: Secondary | ICD-10-CM | POA: Diagnosis not present

## 2016-03-22 DIAGNOSIS — E11319 Type 2 diabetes mellitus with unspecified diabetic retinopathy without macular edema: Secondary | ICD-10-CM | POA: Diagnosis not present

## 2016-03-22 DIAGNOSIS — I4891 Unspecified atrial fibrillation: Secondary | ICD-10-CM | POA: Diagnosis not present

## 2016-03-22 DIAGNOSIS — Z79899 Other long term (current) drug therapy: Secondary | ICD-10-CM | POA: Diagnosis not present

## 2016-03-26 DIAGNOSIS — S82842D Displaced bimalleolar fracture of left lower leg, subsequent encounter for closed fracture with routine healing: Secondary | ICD-10-CM | POA: Diagnosis not present

## 2016-03-28 ENCOUNTER — Encounter: Payer: Medicare Other | Admitting: Sports Medicine

## 2016-03-28 DIAGNOSIS — M8580 Other specified disorders of bone density and structure, unspecified site: Secondary | ICD-10-CM | POA: Diagnosis not present

## 2016-03-28 DIAGNOSIS — M810 Age-related osteoporosis without current pathological fracture: Secondary | ICD-10-CM | POA: Diagnosis not present

## 2016-03-28 DIAGNOSIS — N3289 Other specified disorders of bladder: Secondary | ICD-10-CM | POA: Diagnosis not present

## 2016-03-28 DIAGNOSIS — N3090 Cystitis, unspecified without hematuria: Secondary | ICD-10-CM | POA: Diagnosis not present

## 2016-03-28 DIAGNOSIS — M8588 Other specified disorders of bone density and structure, other site: Secondary | ICD-10-CM | POA: Diagnosis not present

## 2016-04-17 DIAGNOSIS — G43109 Migraine with aura, not intractable, without status migrainosus: Secondary | ICD-10-CM | POA: Insufficient documentation

## 2016-04-17 DIAGNOSIS — R253 Fasciculation: Secondary | ICD-10-CM | POA: Insufficient documentation

## 2016-04-17 DIAGNOSIS — G4733 Obstructive sleep apnea (adult) (pediatric): Secondary | ICD-10-CM | POA: Insufficient documentation

## 2016-04-17 DIAGNOSIS — D0590 Unspecified type of carcinoma in situ of unspecified breast: Secondary | ICD-10-CM | POA: Insufficient documentation

## 2016-04-17 DIAGNOSIS — G2581 Restless legs syndrome: Secondary | ICD-10-CM | POA: Insufficient documentation

## 2016-04-17 DIAGNOSIS — N189 Chronic kidney disease, unspecified: Secondary | ICD-10-CM | POA: Insufficient documentation

## 2016-04-19 DIAGNOSIS — E1142 Type 2 diabetes mellitus with diabetic polyneuropathy: Secondary | ICD-10-CM | POA: Diagnosis not present

## 2016-04-19 DIAGNOSIS — R253 Fasciculation: Secondary | ICD-10-CM | POA: Diagnosis not present

## 2016-04-19 DIAGNOSIS — G2581 Restless legs syndrome: Secondary | ICD-10-CM | POA: Diagnosis not present

## 2016-04-23 DIAGNOSIS — S82842D Displaced bimalleolar fracture of left lower leg, subsequent encounter for closed fracture with routine healing: Secondary | ICD-10-CM | POA: Diagnosis not present

## 2016-05-02 ENCOUNTER — Ambulatory Visit (INDEPENDENT_AMBULATORY_CARE_PROVIDER_SITE_OTHER): Payer: Medicare Other | Admitting: Sports Medicine

## 2016-05-02 ENCOUNTER — Encounter: Payer: Self-pay | Admitting: Sports Medicine

## 2016-05-02 DIAGNOSIS — E119 Type 2 diabetes mellitus without complications: Secondary | ICD-10-CM

## 2016-05-02 NOTE — Progress Notes (Signed)
Patient ID: Heather [name], female   DOB: Apr 23, 1946, 70 y.o.   MRN: [id number] Subjective: Heather [name] is a 70 y.o. female patient with history of diabetes who presents to office today for Diabetic foot examination; blood sugars have been ranging from 70-296. Patient denies any new changes in medication or new problems. Admits to Left ankle fracture in Feb and developing an open wound to the top of her left foot while casted. Patient denies any new cramping, numbness, burning or tingling in the legs.  Patient Active Problem List   Diagnosis Date Noted  . Breast cancer in situ 04/17/2016  . Chronic kidney disease 04/17/2016  . Acute onset aura migraine 04/17/2016  . Fasciculation 04/17/2016  . Obstructive apnea 04/17/2016  . Restless leg 04/17/2016  . Long term current use of anticoagulant 05/30/2015  . Other long term (current) drug therapy 05/30/2015  . Atrial fibrillation ([place]) 08/14/2014  . Type II or unspecified type diabetes mellitus with peripheral circulatory disorders, uncontrolled(250.72) 05/15/2013  . Pains, foot 05/15/2013  . Diabetic peripheral neuropathy ([place]) 05/15/2013  . Morbid obesity ([place]) 05/15/2013  . Bilateral leg edema 05/15/2013  . Edema of foot 05/15/2013  . Diabetes mellitus ([place]) 05/15/2013  . Foot pain 05/15/2013  . Osteoarthritis   . Proliferative diabetic retinopathy of both eyes ([place])   . Bell's palsy    Current Outpatient Prescriptions on File Prior to Visit  Medication Sig Dispense Refill  . acetaminophen (TYLENOL) 500 MG tablet Take 500 mg by mouth every 6 (six) hours as needed.    Marland Kitchen apixaban (ELIQUIS) 5 MG TABS tablet Take 1 tablet (5 mg total) by mouth 2 (two) times daily. 60 tablet 5  . atorvastatin (LIPITOR) 80 MG tablet Take 80 mg by mouth daily.     . Cholecalciferol 2000 UNITS TABS Take 2,000 Units by mouth daily.     . digoxin (LANOXIN) 0.25 MG tablet Take 0.25 mg by mouth daily.    . DULoxetine (CYMBALTA) 30 MG capsule Take 30 mg by mouth  daily.    . INVOKANA 100 MG TABS Take 100 mg by mouth daily before breakfast.     . lisinopril (PRINIVIL,ZESTRIL) 5 MG tablet Take 5 mg by mouth daily.    . metoprolol (LOPRESSOR) 50 MG tablet Take 50 mg by mouth 2 (two) times daily.    . Multiple Vitamins-Minerals (WOMENS MULTIVITAMIN PLUS PO) Take 1 tablet by mouth daily.     Marland Kitchen oxybutynin (DITROPAN) 5 MG tablet Take 5 mg by mouth 2 (two) times daily.    Marland Kitchen oxyCODONE (ROXICODONE) 5 MG immediate release tablet Take 1 tablet (5 mg total) by mouth every 4 (four) hours as needed for moderate pain or severe pain. 30 tablet 0  . pantoprazole (PROTONIX) 40 MG tablet Take 40 mg by mouth daily.     Marland Kitchen rOPINIRole (REQUIP) 1 MG tablet Take 4 mg by mouth daily.     Marland Kitchen tiZANidine (ZANAFLEX) 4 MG tablet Take 8 mg by mouth every 8 (eight) hours as needed (Pain).      No current facility-administered medications on file prior to visit.   Allergies  Allergen Reactions  . Sulfa Antibiotics Itching    No results found for this or any previous visit (from the past 2160 hour(s)).  Objective: General: Patient is awake, alert, and oriented x 3 and in no acute distress.  Integument: Skin is warm, dry and supple bilateral. Nails are x 10 well manicured. No signs of infection. No open lesions or preulcerative  lesions present bilateral evidence of healed wound at top of left foot. Remaining integument unremarkable.  Vasculature:  Dorsalis Pedis pulse 1/4 bilateral. Posterior Tibial pulse  1/4 bilateral.  Capillary fill time <3 sec 1-5 bilateral. Diminished hair growth to the level of the digits. Temperature gradient within normal limits. No varicosities present bilateral. Trace edema present L>R ankle secondary to trauma/fracture history.  Neurology: The patient has intact sensation measured with a 5.07/10g Semmes Weinstein Monofilament at all pedal sites bilateral . Vibratory sensation absent bilateral with tuning fork. No Babinski sign present bilateral.    Musculoskeletal: No symptomatic pedal deformities noted bilateral. Muscular strength 5/5 in all lower extremity muscular groups bilateral without pain on range of motion . No tenderness with calf compression bilateral.  Assessment and Plan: Problem List Items Addressed This Visit    None    Visit Diagnoses    Encounter for comprehensive diabetic foot examination, type 2 diabetes mellitus ([place])    -  Primary       -Examined patient. -Discussed and educated patient on diabetic foot care, especially with  regards to the vascular, neurological and musculoskeletal systems.  -Stressed the importance of good glycemic control and the detriment of not  controlling glucose levels in relation to the foot. -Patient prefers to continue with salon pedicures -Continue with good supportive shoes with plastizote inserts -Answered all patient questions -Patient to return as needed for at risk foot care or sooner if issues arise  Landis Martins, DPM

## 2016-05-17 DIAGNOSIS — N390 Urinary tract infection, site not specified: Secondary | ICD-10-CM | POA: Diagnosis not present

## 2016-05-17 DIAGNOSIS — N3281 Overactive bladder: Secondary | ICD-10-CM | POA: Diagnosis not present

## 2016-05-22 DIAGNOSIS — H43811 Vitreous degeneration, right eye: Secondary | ICD-10-CM | POA: Diagnosis not present

## 2016-05-22 DIAGNOSIS — H26491 Other secondary cataract, right eye: Secondary | ICD-10-CM | POA: Diagnosis not present

## 2016-05-22 DIAGNOSIS — H43812 Vitreous degeneration, left eye: Secondary | ICD-10-CM | POA: Diagnosis not present

## 2016-05-22 DIAGNOSIS — E119 Type 2 diabetes mellitus without complications: Secondary | ICD-10-CM | POA: Diagnosis not present

## 2016-05-22 DIAGNOSIS — H26492 Other secondary cataract, left eye: Secondary | ICD-10-CM | POA: Diagnosis not present

## 2016-05-22 DIAGNOSIS — H353131 Nonexudative age-related macular degeneration, bilateral, early dry stage: Secondary | ICD-10-CM | POA: Diagnosis not present

## 2016-05-23 DIAGNOSIS — N390 Urinary tract infection, site not specified: Secondary | ICD-10-CM | POA: Diagnosis not present

## 2016-05-23 DIAGNOSIS — N200 Calculus of kidney: Secondary | ICD-10-CM | POA: Diagnosis not present

## 2016-05-31 DIAGNOSIS — E11319 Type 2 diabetes mellitus with unspecified diabetic retinopathy without macular edema: Secondary | ICD-10-CM | POA: Diagnosis not present

## 2016-05-31 DIAGNOSIS — R42 Dizziness and giddiness: Secondary | ICD-10-CM | POA: Diagnosis not present

## 2016-05-31 DIAGNOSIS — E7800 Pure hypercholesterolemia, unspecified: Secondary | ICD-10-CM | POA: Diagnosis not present

## 2016-05-31 DIAGNOSIS — E1129 Type 2 diabetes mellitus with other diabetic kidney complication: Secondary | ICD-10-CM | POA: Diagnosis not present

## 2016-06-04 DIAGNOSIS — H26491 Other secondary cataract, right eye: Secondary | ICD-10-CM | POA: Diagnosis not present

## 2016-06-04 DIAGNOSIS — E113299 Type 2 diabetes mellitus with mild nonproliferative diabetic retinopathy without macular edema, unspecified eye: Secondary | ICD-10-CM | POA: Diagnosis not present

## 2016-06-04 DIAGNOSIS — H4321 Crystalline deposits in vitreous body, right eye: Secondary | ICD-10-CM | POA: Diagnosis not present

## 2016-06-04 DIAGNOSIS — Z961 Presence of intraocular lens: Secondary | ICD-10-CM | POA: Diagnosis not present

## 2016-06-04 DIAGNOSIS — H26493 Other secondary cataract, bilateral: Secondary | ICD-10-CM | POA: Diagnosis not present

## 2016-06-14 DIAGNOSIS — Z9841 Cataract extraction status, right eye: Secondary | ICD-10-CM | POA: Diagnosis not present

## 2016-06-14 DIAGNOSIS — Z961 Presence of intraocular lens: Secondary | ICD-10-CM | POA: Diagnosis not present

## 2016-06-25 DIAGNOSIS — H26492 Other secondary cataract, left eye: Secondary | ICD-10-CM | POA: Diagnosis not present

## 2016-06-25 DIAGNOSIS — Z961 Presence of intraocular lens: Secondary | ICD-10-CM | POA: Diagnosis not present

## 2016-06-28 DIAGNOSIS — N3090 Cystitis, unspecified without hematuria: Secondary | ICD-10-CM | POA: Diagnosis not present

## 2016-06-28 DIAGNOSIS — R32 Unspecified urinary incontinence: Secondary | ICD-10-CM | POA: Diagnosis not present

## 2016-06-28 DIAGNOSIS — N390 Urinary tract infection, site not specified: Secondary | ICD-10-CM | POA: Diagnosis not present

## 2016-07-04 DIAGNOSIS — Z9842 Cataract extraction status, left eye: Secondary | ICD-10-CM | POA: Diagnosis not present

## 2016-07-04 DIAGNOSIS — Z961 Presence of intraocular lens: Secondary | ICD-10-CM | POA: Diagnosis not present

## 2016-07-19 DIAGNOSIS — L821 Other seborrheic keratosis: Secondary | ICD-10-CM | POA: Diagnosis not present

## 2016-07-23 DIAGNOSIS — I480 Paroxysmal atrial fibrillation: Secondary | ICD-10-CM | POA: Diagnosis not present

## 2016-07-23 DIAGNOSIS — Z79899 Other long term (current) drug therapy: Secondary | ICD-10-CM | POA: Diagnosis not present

## 2016-07-23 DIAGNOSIS — Z7901 Long term (current) use of anticoagulants: Secondary | ICD-10-CM | POA: Diagnosis not present

## 2016-07-23 DIAGNOSIS — Z6830 Body mass index (BMI) 30.0-30.9, adult: Secondary | ICD-10-CM | POA: Diagnosis not present

## 2016-07-31 DIAGNOSIS — I480 Paroxysmal atrial fibrillation: Secondary | ICD-10-CM | POA: Diagnosis not present

## 2016-08-07 DIAGNOSIS — I482 Chronic atrial fibrillation: Secondary | ICD-10-CM | POA: Diagnosis not present

## 2016-08-10 ENCOUNTER — Other Ambulatory Visit: Payer: Self-pay

## 2016-08-14 DIAGNOSIS — R51 Headache: Secondary | ICD-10-CM | POA: Diagnosis not present

## 2016-08-14 DIAGNOSIS — I480 Paroxysmal atrial fibrillation: Secondary | ICD-10-CM | POA: Diagnosis not present

## 2016-08-14 DIAGNOSIS — R4781 Slurred speech: Secondary | ICD-10-CM | POA: Diagnosis not present

## 2016-08-14 DIAGNOSIS — G459 Transient cerebral ischemic attack, unspecified: Secondary | ICD-10-CM | POA: Diagnosis not present

## 2016-08-14 DIAGNOSIS — Z6830 Body mass index (BMI) 30.0-30.9, adult: Secondary | ICD-10-CM | POA: Diagnosis not present

## 2016-08-14 DIAGNOSIS — E1140 Type 2 diabetes mellitus with diabetic neuropathy, unspecified: Secondary | ICD-10-CM | POA: Diagnosis not present

## 2016-08-14 DIAGNOSIS — I129 Hypertensive chronic kidney disease with stage 1 through stage 4 chronic kidney disease, or unspecified chronic kidney disease: Secondary | ICD-10-CM | POA: Diagnosis not present

## 2016-08-14 DIAGNOSIS — R930 Abnormal findings on diagnostic imaging of skull and head, not elsewhere classified: Secondary | ICD-10-CM | POA: Diagnosis not present

## 2016-08-14 DIAGNOSIS — R9082 White matter disease, unspecified: Secondary | ICD-10-CM | POA: Diagnosis not present

## 2016-08-14 DIAGNOSIS — Z7982 Long term (current) use of aspirin: Secondary | ICD-10-CM | POA: Diagnosis not present

## 2016-08-14 DIAGNOSIS — E119 Type 2 diabetes mellitus without complications: Secondary | ICD-10-CM | POA: Diagnosis not present

## 2016-08-14 DIAGNOSIS — Z8673 Personal history of transient ischemic attack (TIA), and cerebral infarction without residual deficits: Secondary | ICD-10-CM | POA: Diagnosis not present

## 2016-08-14 DIAGNOSIS — I6789 Other cerebrovascular disease: Secondary | ICD-10-CM | POA: Diagnosis not present

## 2016-08-14 DIAGNOSIS — Z79899 Other long term (current) drug therapy: Secondary | ICD-10-CM | POA: Diagnosis not present

## 2016-08-14 DIAGNOSIS — Z794 Long term (current) use of insulin: Secondary | ICD-10-CM | POA: Diagnosis not present

## 2016-08-14 DIAGNOSIS — Z8249 Family history of ischemic heart disease and other diseases of the circulatory system: Secondary | ICD-10-CM | POA: Diagnosis not present

## 2016-08-14 DIAGNOSIS — N183 Chronic kidney disease, stage 3 (moderate): Secondary | ICD-10-CM | POA: Diagnosis not present

## 2016-08-14 DIAGNOSIS — E1122 Type 2 diabetes mellitus with diabetic chronic kidney disease: Secondary | ICD-10-CM | POA: Diagnosis not present

## 2016-08-14 DIAGNOSIS — R200 Anesthesia of skin: Secondary | ICD-10-CM | POA: Diagnosis not present

## 2016-08-15 DIAGNOSIS — Z6830 Body mass index (BMI) 30.0-30.9, adult: Secondary | ICD-10-CM | POA: Diagnosis not present

## 2016-08-15 DIAGNOSIS — G459 Transient cerebral ischemic attack, unspecified: Secondary | ICD-10-CM | POA: Diagnosis not present

## 2016-08-15 DIAGNOSIS — I6523 Occlusion and stenosis of bilateral carotid arteries: Secondary | ICD-10-CM | POA: Diagnosis not present

## 2016-08-15 DIAGNOSIS — Z794 Long term (current) use of insulin: Secondary | ICD-10-CM | POA: Diagnosis not present

## 2016-08-15 DIAGNOSIS — E1122 Type 2 diabetes mellitus with diabetic chronic kidney disease: Secondary | ICD-10-CM | POA: Diagnosis not present

## 2016-08-15 DIAGNOSIS — N182 Chronic kidney disease, stage 2 (mild): Secondary | ICD-10-CM | POA: Diagnosis not present

## 2016-08-15 DIAGNOSIS — I480 Paroxysmal atrial fibrillation: Secondary | ICD-10-CM | POA: Diagnosis not present

## 2016-08-16 DIAGNOSIS — N182 Chronic kidney disease, stage 2 (mild): Secondary | ICD-10-CM | POA: Diagnosis not present

## 2016-08-16 DIAGNOSIS — Z794 Long term (current) use of insulin: Secondary | ICD-10-CM | POA: Diagnosis not present

## 2016-08-16 DIAGNOSIS — I480 Paroxysmal atrial fibrillation: Secondary | ICD-10-CM | POA: Diagnosis not present

## 2016-08-16 DIAGNOSIS — G459 Transient cerebral ischemic attack, unspecified: Secondary | ICD-10-CM | POA: Diagnosis not present

## 2016-08-16 DIAGNOSIS — Z6830 Body mass index (BMI) 30.0-30.9, adult: Secondary | ICD-10-CM | POA: Diagnosis not present

## 2016-08-16 DIAGNOSIS — E1122 Type 2 diabetes mellitus with diabetic chronic kidney disease: Secondary | ICD-10-CM | POA: Diagnosis not present

## 2016-08-30 DIAGNOSIS — G459 Transient cerebral ischemic attack, unspecified: Secondary | ICD-10-CM | POA: Diagnosis not present

## 2016-09-11 DIAGNOSIS — N644 Mastodynia: Secondary | ICD-10-CM | POA: Diagnosis not present

## 2016-09-11 DIAGNOSIS — Z86000 Personal history of in-situ neoplasm of breast: Secondary | ICD-10-CM | POA: Diagnosis not present

## 2016-09-26 DIAGNOSIS — G459 Transient cerebral ischemic attack, unspecified: Secondary | ICD-10-CM | POA: Diagnosis not present

## 2016-09-26 DIAGNOSIS — I480 Paroxysmal atrial fibrillation: Secondary | ICD-10-CM | POA: Diagnosis not present

## 2016-09-26 DIAGNOSIS — Z6832 Body mass index (BMI) 32.0-32.9, adult: Secondary | ICD-10-CM | POA: Diagnosis not present

## 2016-09-26 DIAGNOSIS — Z7901 Long term (current) use of anticoagulants: Secondary | ICD-10-CM | POA: Diagnosis not present

## 2016-09-27 DIAGNOSIS — G4733 Obstructive sleep apnea (adult) (pediatric): Secondary | ICD-10-CM | POA: Diagnosis not present

## 2016-09-27 DIAGNOSIS — R5381 Other malaise: Secondary | ICD-10-CM | POA: Diagnosis not present

## 2016-09-27 DIAGNOSIS — E539 Vitamin B deficiency, unspecified: Secondary | ICD-10-CM | POA: Diagnosis not present

## 2016-09-27 DIAGNOSIS — Z113 Encounter for screening for infections with a predominantly sexual mode of transmission: Secondary | ICD-10-CM | POA: Diagnosis not present

## 2016-09-27 DIAGNOSIS — Z0000 Encounter for general adult medical examination without abnormal findings: Secondary | ICD-10-CM | POA: Diagnosis not present

## 2016-09-27 DIAGNOSIS — E1129 Type 2 diabetes mellitus with other diabetic kidney complication: Secondary | ICD-10-CM | POA: Diagnosis not present

## 2016-09-27 DIAGNOSIS — N39498 Other specified urinary incontinence: Secondary | ICD-10-CM | POA: Diagnosis not present

## 2016-09-27 DIAGNOSIS — Z23 Encounter for immunization: Secondary | ICD-10-CM | POA: Diagnosis not present

## 2016-09-27 DIAGNOSIS — Z79899 Other long term (current) drug therapy: Secondary | ICD-10-CM | POA: Diagnosis not present

## 2016-09-27 DIAGNOSIS — Z9181 History of falling: Secondary | ICD-10-CM | POA: Diagnosis not present

## 2016-09-27 DIAGNOSIS — F330 Major depressive disorder, recurrent, mild: Secondary | ICD-10-CM | POA: Diagnosis not present

## 2016-09-27 DIAGNOSIS — Z6830 Body mass index (BMI) 30.0-30.9, adult: Secondary | ICD-10-CM | POA: Diagnosis not present

## 2016-10-10 DIAGNOSIS — Z86000 Personal history of in-situ neoplasm of breast: Secondary | ICD-10-CM | POA: Diagnosis not present

## 2016-10-10 DIAGNOSIS — N644 Mastodynia: Secondary | ICD-10-CM | POA: Diagnosis not present

## 2016-10-10 DIAGNOSIS — Z961 Presence of intraocular lens: Secondary | ICD-10-CM | POA: Diagnosis not present

## 2016-10-10 DIAGNOSIS — E113299 Type 2 diabetes mellitus with mild nonproliferative diabetic retinopathy without macular edema, unspecified eye: Secondary | ICD-10-CM | POA: Diagnosis not present

## 2016-10-10 DIAGNOSIS — H4323 Crystalline deposits in vitreous body, bilateral: Secondary | ICD-10-CM | POA: Diagnosis not present

## 2016-10-10 DIAGNOSIS — M3501 Sicca syndrome with keratoconjunctivitis: Secondary | ICD-10-CM | POA: Diagnosis not present

## 2016-11-08 DIAGNOSIS — J069 Acute upper respiratory infection, unspecified: Secondary | ICD-10-CM | POA: Diagnosis not present

## 2017-01-01 DIAGNOSIS — I4891 Unspecified atrial fibrillation: Secondary | ICD-10-CM | POA: Diagnosis not present

## 2017-01-01 DIAGNOSIS — F330 Major depressive disorder, recurrent, mild: Secondary | ICD-10-CM | POA: Diagnosis not present

## 2017-01-01 DIAGNOSIS — E1129 Type 2 diabetes mellitus with other diabetic kidney complication: Secondary | ICD-10-CM | POA: Diagnosis not present

## 2017-01-01 DIAGNOSIS — Z7901 Long term (current) use of anticoagulants: Secondary | ICD-10-CM | POA: Diagnosis not present

## 2017-01-01 DIAGNOSIS — E7800 Pure hypercholesterolemia, unspecified: Secondary | ICD-10-CM | POA: Diagnosis not present

## 2017-01-07 DIAGNOSIS — R300 Dysuria: Secondary | ICD-10-CM | POA: Diagnosis not present

## 2017-01-07 DIAGNOSIS — Z7901 Long term (current) use of anticoagulants: Secondary | ICD-10-CM | POA: Diagnosis not present

## 2017-01-07 DIAGNOSIS — R339 Retention of urine, unspecified: Secondary | ICD-10-CM | POA: Diagnosis not present

## 2017-01-07 DIAGNOSIS — I4891 Unspecified atrial fibrillation: Secondary | ICD-10-CM | POA: Diagnosis not present

## 2017-01-07 DIAGNOSIS — E1129 Type 2 diabetes mellitus with other diabetic kidney complication: Secondary | ICD-10-CM | POA: Diagnosis not present

## 2017-01-07 DIAGNOSIS — E7800 Pure hypercholesterolemia, unspecified: Secondary | ICD-10-CM | POA: Diagnosis not present

## 2017-01-07 DIAGNOSIS — N390 Urinary tract infection, site not specified: Secondary | ICD-10-CM | POA: Diagnosis not present

## 2017-01-15 DIAGNOSIS — N3011 Interstitial cystitis (chronic) with hematuria: Secondary | ICD-10-CM | POA: Diagnosis not present

## 2017-01-22 DIAGNOSIS — N200 Calculus of kidney: Secondary | ICD-10-CM | POA: Diagnosis not present

## 2017-01-29 DIAGNOSIS — R3129 Other microscopic hematuria: Secondary | ICD-10-CM | POA: Diagnosis not present

## 2017-01-31 DIAGNOSIS — C4402 Squamous cell carcinoma of skin of lip: Secondary | ICD-10-CM | POA: Diagnosis not present

## 2017-03-01 DIAGNOSIS — R300 Dysuria: Secondary | ICD-10-CM | POA: Diagnosis not present

## 2017-03-01 DIAGNOSIS — N3011 Interstitial cystitis (chronic) with hematuria: Secondary | ICD-10-CM | POA: Diagnosis not present

## 2017-03-11 DIAGNOSIS — E119 Type 2 diabetes mellitus without complications: Secondary | ICD-10-CM | POA: Diagnosis not present

## 2017-03-12 DIAGNOSIS — N200 Calculus of kidney: Secondary | ICD-10-CM | POA: Diagnosis not present

## 2017-03-12 DIAGNOSIS — N390 Urinary tract infection, site not specified: Secondary | ICD-10-CM | POA: Diagnosis not present

## 2017-03-12 DIAGNOSIS — R300 Dysuria: Secondary | ICD-10-CM | POA: Diagnosis not present

## 2017-03-27 DIAGNOSIS — Z7901 Long term (current) use of anticoagulants: Secondary | ICD-10-CM | POA: Diagnosis not present

## 2017-03-27 DIAGNOSIS — I480 Paroxysmal atrial fibrillation: Secondary | ICD-10-CM | POA: Diagnosis not present

## 2017-03-27 DIAGNOSIS — Z79899 Other long term (current) drug therapy: Secondary | ICD-10-CM | POA: Diagnosis not present

## 2017-03-27 DIAGNOSIS — Z6831 Body mass index (BMI) 31.0-31.9, adult: Secondary | ICD-10-CM | POA: Diagnosis not present

## 2017-03-27 DIAGNOSIS — G459 Transient cerebral ischemic attack, unspecified: Secondary | ICD-10-CM | POA: Diagnosis not present

## 2017-04-09 DIAGNOSIS — Z79899 Other long term (current) drug therapy: Secondary | ICD-10-CM | POA: Diagnosis not present

## 2017-04-09 DIAGNOSIS — I480 Paroxysmal atrial fibrillation: Secondary | ICD-10-CM | POA: Diagnosis not present

## 2017-04-29 DIAGNOSIS — F330 Major depressive disorder, recurrent, mild: Secondary | ICD-10-CM | POA: Diagnosis not present

## 2017-04-29 DIAGNOSIS — N39498 Other specified urinary incontinence: Secondary | ICD-10-CM | POA: Diagnosis not present

## 2017-04-29 DIAGNOSIS — E7800 Pure hypercholesterolemia, unspecified: Secondary | ICD-10-CM | POA: Diagnosis not present

## 2017-04-29 DIAGNOSIS — I4891 Unspecified atrial fibrillation: Secondary | ICD-10-CM | POA: Diagnosis not present

## 2017-04-29 DIAGNOSIS — Z79899 Other long term (current) drug therapy: Secondary | ICD-10-CM | POA: Diagnosis not present

## 2017-04-29 DIAGNOSIS — Z86010 Personal history of colonic polyps: Secondary | ICD-10-CM | POA: Diagnosis not present

## 2017-04-29 DIAGNOSIS — E1129 Type 2 diabetes mellitus with other diabetic kidney complication: Secondary | ICD-10-CM | POA: Diagnosis not present

## 2017-04-29 DIAGNOSIS — R319 Hematuria, unspecified: Secondary | ICD-10-CM | POA: Diagnosis not present

## 2017-04-29 DIAGNOSIS — Z1239 Encounter for other screening for malignant neoplasm of breast: Secondary | ICD-10-CM | POA: Diagnosis not present

## 2017-05-02 DIAGNOSIS — G459 Transient cerebral ischemic attack, unspecified: Secondary | ICD-10-CM | POA: Diagnosis not present

## 2017-05-02 DIAGNOSIS — I480 Paroxysmal atrial fibrillation: Secondary | ICD-10-CM | POA: Diagnosis not present

## 2017-05-02 DIAGNOSIS — R2689 Other abnormalities of gait and mobility: Secondary | ICD-10-CM | POA: Diagnosis not present

## 2017-05-02 DIAGNOSIS — R531 Weakness: Secondary | ICD-10-CM | POA: Diagnosis not present

## 2017-05-02 DIAGNOSIS — R319 Hematuria, unspecified: Secondary | ICD-10-CM | POA: Diagnosis not present

## 2017-05-02 DIAGNOSIS — G4733 Obstructive sleep apnea (adult) (pediatric): Secondary | ICD-10-CM | POA: Diagnosis not present

## 2017-05-02 DIAGNOSIS — R251 Tremor, unspecified: Secondary | ICD-10-CM | POA: Diagnosis not present

## 2017-05-02 DIAGNOSIS — I639 Cerebral infarction, unspecified: Secondary | ICD-10-CM | POA: Diagnosis not present

## 2017-05-02 DIAGNOSIS — N179 Acute kidney failure, unspecified: Secondary | ICD-10-CM | POA: Diagnosis not present

## 2017-05-02 DIAGNOSIS — E119 Type 2 diabetes mellitus without complications: Secondary | ICD-10-CM | POA: Diagnosis not present

## 2017-05-02 DIAGNOSIS — R443 Hallucinations, unspecified: Secondary | ICD-10-CM | POA: Diagnosis not present

## 2017-05-02 DIAGNOSIS — Z794 Long term (current) use of insulin: Secondary | ICD-10-CM | POA: Diagnosis not present

## 2017-05-02 DIAGNOSIS — R4781 Slurred speech: Secondary | ICD-10-CM | POA: Diagnosis not present

## 2017-05-02 DIAGNOSIS — N184 Chronic kidney disease, stage 4 (severe): Secondary | ICD-10-CM | POA: Diagnosis not present

## 2017-05-02 DIAGNOSIS — Z6831 Body mass index (BMI) 31.0-31.9, adult: Secondary | ICD-10-CM | POA: Diagnosis not present

## 2017-05-02 DIAGNOSIS — G4730 Sleep apnea, unspecified: Secondary | ICD-10-CM | POA: Diagnosis not present

## 2017-05-02 DIAGNOSIS — Z7901 Long term (current) use of anticoagulants: Secondary | ICD-10-CM | POA: Diagnosis not present

## 2017-05-02 DIAGNOSIS — E1122 Type 2 diabetes mellitus with diabetic chronic kidney disease: Secondary | ICD-10-CM | POA: Diagnosis not present

## 2017-05-02 DIAGNOSIS — I129 Hypertensive chronic kidney disease with stage 1 through stage 4 chronic kidney disease, or unspecified chronic kidney disease: Secondary | ICD-10-CM | POA: Diagnosis not present

## 2017-05-02 DIAGNOSIS — R270 Ataxia, unspecified: Secondary | ICD-10-CM | POA: Diagnosis not present

## 2017-05-02 DIAGNOSIS — F82 Specific developmental disorder of motor function: Secondary | ICD-10-CM | POA: Diagnosis not present

## 2017-05-02 DIAGNOSIS — J150 Pneumonia due to Klebsiella pneumoniae: Secondary | ICD-10-CM | POA: Diagnosis not present

## 2017-05-02 DIAGNOSIS — N390 Urinary tract infection, site not specified: Secondary | ICD-10-CM | POA: Diagnosis not present

## 2017-05-02 DIAGNOSIS — N183 Chronic kidney disease, stage 3 (moderate): Secondary | ICD-10-CM | POA: Diagnosis not present

## 2017-05-03 DIAGNOSIS — Z794 Long term (current) use of insulin: Secondary | ICD-10-CM | POA: Diagnosis not present

## 2017-05-03 DIAGNOSIS — H532 Diplopia: Secondary | ICD-10-CM | POA: Diagnosis not present

## 2017-05-03 DIAGNOSIS — E1122 Type 2 diabetes mellitus with diabetic chronic kidney disease: Secondary | ICD-10-CM | POA: Diagnosis not present

## 2017-05-03 DIAGNOSIS — I340 Nonrheumatic mitral (valve) insufficiency: Secondary | ICD-10-CM | POA: Diagnosis not present

## 2017-05-03 DIAGNOSIS — R278 Other lack of coordination: Secondary | ICD-10-CM | POA: Diagnosis not present

## 2017-05-03 DIAGNOSIS — I480 Paroxysmal atrial fibrillation: Secondary | ICD-10-CM | POA: Diagnosis not present

## 2017-05-03 DIAGNOSIS — Z6833 Body mass index (BMI) 33.0-33.9, adult: Secondary | ICD-10-CM | POA: Diagnosis not present

## 2017-05-03 DIAGNOSIS — N183 Chronic kidney disease, stage 3 (moderate): Secondary | ICD-10-CM | POA: Diagnosis not present

## 2017-05-03 DIAGNOSIS — B961 Klebsiella pneumoniae [K. pneumoniae] as the cause of diseases classified elsewhere: Secondary | ICD-10-CM | POA: Diagnosis not present

## 2017-05-03 DIAGNOSIS — N182 Chronic kidney disease, stage 2 (mild): Secondary | ICD-10-CM | POA: Diagnosis not present

## 2017-05-03 DIAGNOSIS — G450 Vertebro-basilar artery syndrome: Secondary | ICD-10-CM | POA: Diagnosis not present

## 2017-05-03 DIAGNOSIS — R4182 Altered mental status, unspecified: Secondary | ICD-10-CM | POA: Diagnosis not present

## 2017-05-03 DIAGNOSIS — N390 Urinary tract infection, site not specified: Secondary | ICD-10-CM | POA: Diagnosis not present

## 2017-05-03 DIAGNOSIS — I639 Cerebral infarction, unspecified: Secondary | ICD-10-CM | POA: Diagnosis not present

## 2017-05-03 DIAGNOSIS — I10 Essential (primary) hypertension: Secondary | ICD-10-CM | POA: Diagnosis not present

## 2017-05-04 DIAGNOSIS — E1122 Type 2 diabetes mellitus with diabetic chronic kidney disease: Secondary | ICD-10-CM | POA: Diagnosis not present

## 2017-05-04 DIAGNOSIS — B961 Klebsiella pneumoniae [K. pneumoniae] as the cause of diseases classified elsewhere: Secondary | ICD-10-CM | POA: Diagnosis not present

## 2017-05-04 DIAGNOSIS — N182 Chronic kidney disease, stage 2 (mild): Secondary | ICD-10-CM | POA: Diagnosis not present

## 2017-05-04 DIAGNOSIS — N390 Urinary tract infection, site not specified: Secondary | ICD-10-CM | POA: Diagnosis not present

## 2017-05-04 DIAGNOSIS — Z6833 Body mass index (BMI) 33.0-33.9, adult: Secondary | ICD-10-CM | POA: Diagnosis not present

## 2017-05-04 DIAGNOSIS — G450 Vertebro-basilar artery syndrome: Secondary | ICD-10-CM | POA: Diagnosis not present

## 2017-05-04 DIAGNOSIS — I480 Paroxysmal atrial fibrillation: Secondary | ICD-10-CM | POA: Diagnosis not present

## 2017-05-04 DIAGNOSIS — I10 Essential (primary) hypertension: Secondary | ICD-10-CM | POA: Diagnosis not present

## 2017-05-04 DIAGNOSIS — Z794 Long term (current) use of insulin: Secondary | ICD-10-CM | POA: Diagnosis not present

## 2017-05-04 DIAGNOSIS — N183 Chronic kidney disease, stage 3 (moderate): Secondary | ICD-10-CM | POA: Diagnosis not present

## 2017-05-24 DIAGNOSIS — Z6832 Body mass index (BMI) 32.0-32.9, adult: Secondary | ICD-10-CM | POA: Diagnosis not present

## 2017-05-24 DIAGNOSIS — I671 Cerebral aneurysm, nonruptured: Secondary | ICD-10-CM | POA: Diagnosis not present

## 2017-05-27 DIAGNOSIS — N39498 Other specified urinary incontinence: Secondary | ICD-10-CM | POA: Diagnosis not present

## 2017-05-27 DIAGNOSIS — R208 Other disturbances of skin sensation: Secondary | ICD-10-CM | POA: Diagnosis not present

## 2017-05-27 DIAGNOSIS — K219 Gastro-esophageal reflux disease without esophagitis: Secondary | ICD-10-CM | POA: Diagnosis not present

## 2017-05-27 DIAGNOSIS — E11319 Type 2 diabetes mellitus with unspecified diabetic retinopathy without macular edema: Secondary | ICD-10-CM | POA: Diagnosis not present

## 2017-05-27 DIAGNOSIS — Z87440 Personal history of urinary (tract) infections: Secondary | ICD-10-CM | POA: Diagnosis not present

## 2017-05-28 DIAGNOSIS — Z86010 Personal history of colonic polyps: Secondary | ICD-10-CM | POA: Diagnosis not present

## 2017-05-28 DIAGNOSIS — K5900 Constipation, unspecified: Secondary | ICD-10-CM | POA: Diagnosis not present

## 2017-06-10 DIAGNOSIS — L820 Inflamed seborrheic keratosis: Secondary | ICD-10-CM | POA: Diagnosis not present

## 2017-07-22 DIAGNOSIS — M1990 Unspecified osteoarthritis, unspecified site: Secondary | ICD-10-CM | POA: Diagnosis not present

## 2017-07-22 DIAGNOSIS — K219 Gastro-esophageal reflux disease without esophagitis: Secondary | ICD-10-CM | POA: Diagnosis not present

## 2017-07-22 DIAGNOSIS — K648 Other hemorrhoids: Secondary | ICD-10-CM | POA: Diagnosis not present

## 2017-07-22 DIAGNOSIS — Q438 Other specified congenital malformations of intestine: Secondary | ICD-10-CM | POA: Diagnosis not present

## 2017-07-22 DIAGNOSIS — Z853 Personal history of malignant neoplasm of breast: Secondary | ICD-10-CM | POA: Diagnosis not present

## 2017-07-22 DIAGNOSIS — Z9049 Acquired absence of other specified parts of digestive tract: Secondary | ICD-10-CM | POA: Diagnosis not present

## 2017-07-22 DIAGNOSIS — Z86010 Personal history of colonic polyps: Secondary | ICD-10-CM | POA: Diagnosis not present

## 2017-07-22 DIAGNOSIS — E1122 Type 2 diabetes mellitus with diabetic chronic kidney disease: Secondary | ICD-10-CM | POA: Diagnosis not present

## 2017-07-22 DIAGNOSIS — K5730 Diverticulosis of large intestine without perforation or abscess without bleeding: Secondary | ICD-10-CM | POA: Diagnosis not present

## 2017-07-22 DIAGNOSIS — G4733 Obstructive sleep apnea (adult) (pediatric): Secondary | ICD-10-CM | POA: Diagnosis not present

## 2017-07-22 DIAGNOSIS — Z1211 Encounter for screening for malignant neoplasm of colon: Secondary | ICD-10-CM | POA: Diagnosis not present

## 2017-07-22 DIAGNOSIS — N189 Chronic kidney disease, unspecified: Secondary | ICD-10-CM | POA: Diagnosis not present

## 2017-07-22 DIAGNOSIS — E669 Obesity, unspecified: Secondary | ICD-10-CM | POA: Diagnosis not present

## 2017-07-22 DIAGNOSIS — Z90710 Acquired absence of both cervix and uterus: Secondary | ICD-10-CM | POA: Diagnosis not present

## 2017-07-22 DIAGNOSIS — F418 Other specified anxiety disorders: Secondary | ICD-10-CM | POA: Diagnosis not present

## 2017-07-22 DIAGNOSIS — D120 Benign neoplasm of cecum: Secondary | ICD-10-CM | POA: Diagnosis not present

## 2017-07-22 DIAGNOSIS — D631 Anemia in chronic kidney disease: Secondary | ICD-10-CM | POA: Diagnosis not present

## 2017-07-22 DIAGNOSIS — E119 Type 2 diabetes mellitus without complications: Secondary | ICD-10-CM | POA: Diagnosis not present

## 2017-07-22 DIAGNOSIS — K635 Polyp of colon: Secondary | ICD-10-CM | POA: Diagnosis not present

## 2017-07-22 DIAGNOSIS — D122 Benign neoplasm of ascending colon: Secondary | ICD-10-CM | POA: Diagnosis not present

## 2017-07-22 DIAGNOSIS — E7800 Pure hypercholesterolemia, unspecified: Secondary | ICD-10-CM | POA: Diagnosis not present

## 2017-07-22 DIAGNOSIS — I4891 Unspecified atrial fibrillation: Secondary | ICD-10-CM | POA: Diagnosis not present

## 2017-07-31 DIAGNOSIS — E11319 Type 2 diabetes mellitus with unspecified diabetic retinopathy without macular edema: Secondary | ICD-10-CM | POA: Diagnosis not present

## 2017-07-31 DIAGNOSIS — E1129 Type 2 diabetes mellitus with other diabetic kidney complication: Secondary | ICD-10-CM | POA: Diagnosis not present

## 2017-07-31 DIAGNOSIS — G4733 Obstructive sleep apnea (adult) (pediatric): Secondary | ICD-10-CM | POA: Diagnosis not present

## 2017-07-31 DIAGNOSIS — I4891 Unspecified atrial fibrillation: Secondary | ICD-10-CM | POA: Diagnosis not present

## 2017-07-31 DIAGNOSIS — F321 Major depressive disorder, single episode, moderate: Secondary | ICD-10-CM | POA: Diagnosis not present

## 2017-07-31 DIAGNOSIS — Z79899 Other long term (current) drug therapy: Secondary | ICD-10-CM | POA: Diagnosis not present

## 2017-07-31 DIAGNOSIS — E7800 Pure hypercholesterolemia, unspecified: Secondary | ICD-10-CM | POA: Diagnosis not present

## 2017-08-01 DIAGNOSIS — E7800 Pure hypercholesterolemia, unspecified: Secondary | ICD-10-CM | POA: Diagnosis not present

## 2017-08-01 DIAGNOSIS — Z79899 Other long term (current) drug therapy: Secondary | ICD-10-CM | POA: Diagnosis not present

## 2017-08-01 DIAGNOSIS — E1129 Type 2 diabetes mellitus with other diabetic kidney complication: Secondary | ICD-10-CM | POA: Diagnosis not present

## 2017-10-08 DIAGNOSIS — Z23 Encounter for immunization: Secondary | ICD-10-CM | POA: Diagnosis not present

## 2017-10-12 DIAGNOSIS — E118 Type 2 diabetes mellitus with unspecified complications: Secondary | ICD-10-CM | POA: Diagnosis not present

## 2017-10-14 DIAGNOSIS — R51 Headache: Secondary | ICD-10-CM | POA: Diagnosis not present

## 2017-10-14 DIAGNOSIS — I671 Cerebral aneurysm, nonruptured: Secondary | ICD-10-CM | POA: Diagnosis not present

## 2017-10-14 DIAGNOSIS — I6521 Occlusion and stenosis of right carotid artery: Secondary | ICD-10-CM | POA: Diagnosis not present

## 2017-10-16 DIAGNOSIS — Z1231 Encounter for screening mammogram for malignant neoplasm of breast: Secondary | ICD-10-CM | POA: Diagnosis not present

## 2017-10-18 DIAGNOSIS — M79604 Pain in right leg: Secondary | ICD-10-CM | POA: Diagnosis not present

## 2017-10-18 DIAGNOSIS — I10 Essential (primary) hypertension: Secondary | ICD-10-CM | POA: Diagnosis not present

## 2017-10-18 DIAGNOSIS — M4316 Spondylolisthesis, lumbar region: Secondary | ICD-10-CM | POA: Diagnosis not present

## 2017-10-18 DIAGNOSIS — M79605 Pain in left leg: Secondary | ICD-10-CM | POA: Diagnosis not present

## 2017-10-18 DIAGNOSIS — I671 Cerebral aneurysm, nonruptured: Secondary | ICD-10-CM | POA: Diagnosis not present

## 2017-10-22 DIAGNOSIS — Z1231 Encounter for screening mammogram for malignant neoplasm of breast: Secondary | ICD-10-CM | POA: Diagnosis not present

## 2017-10-22 DIAGNOSIS — Z86000 Personal history of in-situ neoplasm of breast: Secondary | ICD-10-CM | POA: Diagnosis not present

## 2017-10-22 DIAGNOSIS — Z6833 Body mass index (BMI) 33.0-33.9, adult: Secondary | ICD-10-CM | POA: Diagnosis not present

## 2017-10-22 DIAGNOSIS — N6011 Diffuse cystic mastopathy of right breast: Secondary | ICD-10-CM | POA: Diagnosis not present

## 2017-10-22 DIAGNOSIS — N6012 Diffuse cystic mastopathy of left breast: Secondary | ICD-10-CM | POA: Diagnosis not present

## 2017-11-11 DIAGNOSIS — E11319 Type 2 diabetes mellitus with unspecified diabetic retinopathy without macular edema: Secondary | ICD-10-CM | POA: Diagnosis not present

## 2017-11-11 DIAGNOSIS — L570 Actinic keratosis: Secondary | ICD-10-CM | POA: Diagnosis not present

## 2017-11-11 DIAGNOSIS — Z79899 Other long term (current) drug therapy: Secondary | ICD-10-CM | POA: Diagnosis not present

## 2017-11-11 DIAGNOSIS — E1129 Type 2 diabetes mellitus with other diabetic kidney complication: Secondary | ICD-10-CM | POA: Diagnosis not present

## 2017-11-11 DIAGNOSIS — E7800 Pure hypercholesterolemia, unspecified: Secondary | ICD-10-CM | POA: Diagnosis not present

## 2017-11-14 DIAGNOSIS — N183 Chronic kidney disease, stage 3 (moderate): Secondary | ICD-10-CM | POA: Diagnosis not present

## 2017-11-14 DIAGNOSIS — Z7901 Long term (current) use of anticoagulants: Secondary | ICD-10-CM | POA: Diagnosis not present

## 2017-11-14 DIAGNOSIS — I129 Hypertensive chronic kidney disease with stage 1 through stage 4 chronic kidney disease, or unspecified chronic kidney disease: Secondary | ICD-10-CM | POA: Diagnosis not present

## 2017-11-14 DIAGNOSIS — I480 Paroxysmal atrial fibrillation: Secondary | ICD-10-CM | POA: Diagnosis not present

## 2017-12-23 DIAGNOSIS — E669 Obesity, unspecified: Secondary | ICD-10-CM | POA: Diagnosis not present

## 2017-12-23 DIAGNOSIS — Z0000 Encounter for general adult medical examination without abnormal findings: Secondary | ICD-10-CM | POA: Diagnosis not present

## 2017-12-23 DIAGNOSIS — Z6833 Body mass index (BMI) 33.0-33.9, adult: Secondary | ICD-10-CM | POA: Diagnosis not present

## 2017-12-23 DIAGNOSIS — G2581 Restless legs syndrome: Secondary | ICD-10-CM | POA: Diagnosis not present

## 2017-12-23 DIAGNOSIS — F324 Major depressive disorder, single episode, in partial remission: Secondary | ICD-10-CM | POA: Diagnosis not present

## 2017-12-23 DIAGNOSIS — R251 Tremor, unspecified: Secondary | ICD-10-CM | POA: Diagnosis not present

## 2017-12-23 DIAGNOSIS — N39498 Other specified urinary incontinence: Secondary | ICD-10-CM | POA: Diagnosis not present

## 2018-01-29 DIAGNOSIS — S0031XA Abrasion of nose, initial encounter: Secondary | ICD-10-CM | POA: Diagnosis not present

## 2018-01-29 DIAGNOSIS — G8911 Acute pain due to trauma: Secondary | ICD-10-CM | POA: Diagnosis not present

## 2018-01-29 DIAGNOSIS — S0081XA Abrasion of other part of head, initial encounter: Secondary | ICD-10-CM | POA: Diagnosis not present

## 2018-01-29 DIAGNOSIS — M542 Cervicalgia: Secondary | ICD-10-CM | POA: Diagnosis not present

## 2018-01-29 DIAGNOSIS — W19XXXA Unspecified fall, initial encounter: Secondary | ICD-10-CM | POA: Diagnosis not present

## 2018-01-29 DIAGNOSIS — R51 Headache: Secondary | ICD-10-CM | POA: Diagnosis not present

## 2018-01-29 DIAGNOSIS — S92351A Displaced fracture of fifth metatarsal bone, right foot, initial encounter for closed fracture: Secondary | ICD-10-CM | POA: Diagnosis not present

## 2018-01-29 DIAGNOSIS — S638X1A Sprain of other part of right wrist and hand, initial encounter: Secondary | ICD-10-CM | POA: Diagnosis not present

## 2018-01-29 DIAGNOSIS — M79671 Pain in right foot: Secondary | ICD-10-CM | POA: Diagnosis not present

## 2018-01-29 DIAGNOSIS — S0083XA Contusion of other part of head, initial encounter: Secondary | ICD-10-CM | POA: Diagnosis not present

## 2018-01-29 DIAGNOSIS — M25531 Pain in right wrist: Secondary | ICD-10-CM | POA: Diagnosis not present

## 2018-01-29 DIAGNOSIS — S0993XA Unspecified injury of face, initial encounter: Secondary | ICD-10-CM | POA: Diagnosis not present

## 2018-01-29 DIAGNOSIS — Z7901 Long term (current) use of anticoagulants: Secondary | ICD-10-CM | POA: Diagnosis not present

## 2018-01-29 DIAGNOSIS — Z043 Encounter for examination and observation following other accident: Secondary | ICD-10-CM | POA: Diagnosis not present

## 2018-01-29 DIAGNOSIS — W172XXA Fall into hole, initial encounter: Secondary | ICD-10-CM | POA: Diagnosis not present

## 2018-01-29 DIAGNOSIS — S63501A Unspecified sprain of right wrist, initial encounter: Secondary | ICD-10-CM | POA: Diagnosis not present

## 2018-01-29 DIAGNOSIS — S0003XA Contusion of scalp, initial encounter: Secondary | ICD-10-CM | POA: Diagnosis not present

## 2018-01-29 DIAGNOSIS — I4891 Unspecified atrial fibrillation: Secondary | ICD-10-CM | POA: Diagnosis not present

## 2018-02-03 DIAGNOSIS — M79671 Pain in right foot: Secondary | ICD-10-CM | POA: Diagnosis not present

## 2018-02-03 DIAGNOSIS — S92354A Nondisplaced fracture of fifth metatarsal bone, right foot, initial encounter for closed fracture: Secondary | ICD-10-CM | POA: Diagnosis not present

## 2018-02-24 DIAGNOSIS — S92354A Nondisplaced fracture of fifth metatarsal bone, right foot, initial encounter for closed fracture: Secondary | ICD-10-CM | POA: Diagnosis not present

## 2018-02-24 DIAGNOSIS — M25571 Pain in right ankle and joints of right foot: Secondary | ICD-10-CM | POA: Diagnosis not present

## 2018-03-12 DIAGNOSIS — L821 Other seborrheic keratosis: Secondary | ICD-10-CM | POA: Diagnosis not present

## 2018-03-12 DIAGNOSIS — C44321 Squamous cell carcinoma of skin of nose: Secondary | ICD-10-CM | POA: Diagnosis not present

## 2018-03-21 DIAGNOSIS — M25571 Pain in right ankle and joints of right foot: Secondary | ICD-10-CM | POA: Diagnosis not present

## 2018-03-21 DIAGNOSIS — S92354A Nondisplaced fracture of fifth metatarsal bone, right foot, initial encounter for closed fracture: Secondary | ICD-10-CM | POA: Diagnosis not present

## 2018-03-24 DIAGNOSIS — E669 Obesity, unspecified: Secondary | ICD-10-CM | POA: Diagnosis not present

## 2018-03-24 DIAGNOSIS — Z6834 Body mass index (BMI) 34.0-34.9, adult: Secondary | ICD-10-CM | POA: Diagnosis not present

## 2018-03-24 DIAGNOSIS — E1129 Type 2 diabetes mellitus with other diabetic kidney complication: Secondary | ICD-10-CM | POA: Diagnosis not present

## 2018-03-24 DIAGNOSIS — E11319 Type 2 diabetes mellitus with unspecified diabetic retinopathy without macular edema: Secondary | ICD-10-CM | POA: Diagnosis not present

## 2018-03-24 DIAGNOSIS — Z79899 Other long term (current) drug therapy: Secondary | ICD-10-CM | POA: Diagnosis not present

## 2018-03-24 DIAGNOSIS — Z794 Long term (current) use of insulin: Secondary | ICD-10-CM | POA: Diagnosis not present

## 2018-03-24 DIAGNOSIS — E7800 Pure hypercholesterolemia, unspecified: Secondary | ICD-10-CM | POA: Diagnosis not present

## 2018-03-26 DIAGNOSIS — E119 Type 2 diabetes mellitus without complications: Secondary | ICD-10-CM | POA: Diagnosis not present

## 2018-04-09 DIAGNOSIS — M26602 Left temporomandibular joint disorder, unspecified: Secondary | ICD-10-CM | POA: Diagnosis not present

## 2018-05-21 DIAGNOSIS — M25571 Pain in right ankle and joints of right foot: Secondary | ICD-10-CM | POA: Diagnosis not present

## 2018-05-21 DIAGNOSIS — S92354A Nondisplaced fracture of fifth metatarsal bone, right foot, initial encounter for closed fracture: Secondary | ICD-10-CM | POA: Diagnosis not present

## 2018-05-21 DIAGNOSIS — M545 Low back pain: Secondary | ICD-10-CM | POA: Diagnosis not present

## 2018-05-26 DIAGNOSIS — R262 Difficulty in walking, not elsewhere classified: Secondary | ICD-10-CM | POA: Diagnosis not present

## 2018-05-26 DIAGNOSIS — M545 Low back pain: Secondary | ICD-10-CM | POA: Diagnosis not present

## 2018-05-29 DIAGNOSIS — M545 Low back pain: Secondary | ICD-10-CM | POA: Diagnosis not present

## 2018-05-29 DIAGNOSIS — R262 Difficulty in walking, not elsewhere classified: Secondary | ICD-10-CM | POA: Diagnosis not present

## 2018-06-11 ENCOUNTER — Ambulatory Visit: Payer: Medicare Other | Admitting: Sports Medicine

## 2018-06-18 DIAGNOSIS — N393 Stress incontinence (female) (male): Secondary | ICD-10-CM | POA: Diagnosis not present

## 2018-06-18 DIAGNOSIS — Z01419 Encounter for gynecological examination (general) (routine) without abnormal findings: Secondary | ICD-10-CM | POA: Diagnosis not present

## 2018-06-18 DIAGNOSIS — N3941 Urge incontinence: Secondary | ICD-10-CM | POA: Diagnosis not present

## 2018-06-23 DIAGNOSIS — E7800 Pure hypercholesterolemia, unspecified: Secondary | ICD-10-CM | POA: Diagnosis not present

## 2018-06-23 DIAGNOSIS — F330 Major depressive disorder, recurrent, mild: Secondary | ICD-10-CM | POA: Diagnosis not present

## 2018-06-23 DIAGNOSIS — Z794 Long term (current) use of insulin: Secondary | ICD-10-CM | POA: Diagnosis not present

## 2018-06-23 DIAGNOSIS — E669 Obesity, unspecified: Secondary | ICD-10-CM | POA: Diagnosis not present

## 2018-06-23 DIAGNOSIS — E1129 Type 2 diabetes mellitus with other diabetic kidney complication: Secondary | ICD-10-CM | POA: Diagnosis not present

## 2018-06-23 DIAGNOSIS — Z79899 Other long term (current) drug therapy: Secondary | ICD-10-CM | POA: Diagnosis not present

## 2018-06-23 DIAGNOSIS — Z6833 Body mass index (BMI) 33.0-33.9, adult: Secondary | ICD-10-CM | POA: Diagnosis not present

## 2018-06-24 DIAGNOSIS — E1129 Type 2 diabetes mellitus with other diabetic kidney complication: Secondary | ICD-10-CM | POA: Diagnosis not present

## 2018-06-24 DIAGNOSIS — Z79899 Other long term (current) drug therapy: Secondary | ICD-10-CM | POA: Diagnosis not present

## 2018-07-04 ENCOUNTER — Ambulatory Visit: Payer: Medicare Other | Admitting: Sports Medicine

## 2018-08-05 DIAGNOSIS — H43811 Vitreous degeneration, right eye: Secondary | ICD-10-CM | POA: Diagnosis not present

## 2018-08-05 DIAGNOSIS — Z961 Presence of intraocular lens: Secondary | ICD-10-CM | POA: Diagnosis not present

## 2018-08-05 DIAGNOSIS — H353131 Nonexudative age-related macular degeneration, bilateral, early dry stage: Secondary | ICD-10-CM | POA: Diagnosis not present

## 2018-08-05 DIAGNOSIS — E119 Type 2 diabetes mellitus without complications: Secondary | ICD-10-CM | POA: Diagnosis not present

## 2018-08-05 DIAGNOSIS — H02831 Dermatochalasis of right upper eyelid: Secondary | ICD-10-CM | POA: Diagnosis not present

## 2018-08-25 DIAGNOSIS — L578 Other skin changes due to chronic exposure to nonionizing radiation: Secondary | ICD-10-CM | POA: Diagnosis not present

## 2018-08-25 DIAGNOSIS — L820 Inflamed seborrheic keratosis: Secondary | ICD-10-CM | POA: Diagnosis not present

## 2018-08-25 DIAGNOSIS — D045 Carcinoma in situ of skin of trunk: Secondary | ICD-10-CM | POA: Diagnosis not present

## 2018-09-18 DIAGNOSIS — E7800 Pure hypercholesterolemia, unspecified: Secondary | ICD-10-CM | POA: Diagnosis not present

## 2018-09-18 DIAGNOSIS — L720 Epidermal cyst: Secondary | ICD-10-CM | POA: Diagnosis not present

## 2018-09-18 DIAGNOSIS — E1121 Type 2 diabetes mellitus with diabetic nephropathy: Secondary | ICD-10-CM | POA: Diagnosis not present

## 2018-09-18 DIAGNOSIS — Z79899 Other long term (current) drug therapy: Secondary | ICD-10-CM | POA: Diagnosis not present

## 2018-09-18 DIAGNOSIS — N184 Chronic kidney disease, stage 4 (severe): Secondary | ICD-10-CM | POA: Diagnosis not present

## 2018-09-18 DIAGNOSIS — Z23 Encounter for immunization: Secondary | ICD-10-CM | POA: Diagnosis not present

## 2018-09-22 DIAGNOSIS — D045 Carcinoma in situ of skin of trunk: Secondary | ICD-10-CM | POA: Diagnosis not present

## 2018-10-20 DIAGNOSIS — Z1231 Encounter for screening mammogram for malignant neoplasm of breast: Secondary | ICD-10-CM | POA: Diagnosis not present

## 2018-11-19 DIAGNOSIS — D485 Neoplasm of uncertain behavior of skin: Secondary | ICD-10-CM | POA: Diagnosis not present

## 2018-11-19 DIAGNOSIS — L821 Other seborrheic keratosis: Secondary | ICD-10-CM | POA: Diagnosis not present

## 2018-11-19 DIAGNOSIS — D045 Carcinoma in situ of skin of trunk: Secondary | ICD-10-CM | POA: Diagnosis not present

## 2018-12-19 DIAGNOSIS — N183 Chronic kidney disease, stage 3 (moderate): Secondary | ICD-10-CM | POA: Diagnosis not present

## 2018-12-19 DIAGNOSIS — I129 Hypertensive chronic kidney disease with stage 1 through stage 4 chronic kidney disease, or unspecified chronic kidney disease: Secondary | ICD-10-CM | POA: Diagnosis not present

## 2018-12-19 DIAGNOSIS — I4891 Unspecified atrial fibrillation: Secondary | ICD-10-CM | POA: Diagnosis not present

## 2018-12-19 DIAGNOSIS — I480 Paroxysmal atrial fibrillation: Secondary | ICD-10-CM | POA: Diagnosis not present

## 2018-12-19 DIAGNOSIS — Z7901 Long term (current) use of anticoagulants: Secondary | ICD-10-CM | POA: Diagnosis not present

## 2018-12-24 DIAGNOSIS — Z7901 Long term (current) use of anticoagulants: Secondary | ICD-10-CM | POA: Diagnosis not present

## 2018-12-24 DIAGNOSIS — N183 Chronic kidney disease, stage 3 (moderate): Secondary | ICD-10-CM | POA: Diagnosis not present

## 2018-12-24 DIAGNOSIS — I129 Hypertensive chronic kidney disease with stage 1 through stage 4 chronic kidney disease, or unspecified chronic kidney disease: Secondary | ICD-10-CM | POA: Diagnosis not present

## 2018-12-24 DIAGNOSIS — I480 Paroxysmal atrial fibrillation: Secondary | ICD-10-CM | POA: Diagnosis not present

## 2018-12-24 DIAGNOSIS — Z79899 Other long term (current) drug therapy: Secondary | ICD-10-CM | POA: Diagnosis not present

## 2018-12-24 DIAGNOSIS — Z01818 Encounter for other preprocedural examination: Secondary | ICD-10-CM | POA: Diagnosis not present

## 2018-12-25 DIAGNOSIS — Z794 Long term (current) use of insulin: Secondary | ICD-10-CM | POA: Diagnosis not present

## 2018-12-25 DIAGNOSIS — I129 Hypertensive chronic kidney disease with stage 1 through stage 4 chronic kidney disease, or unspecified chronic kidney disease: Secondary | ICD-10-CM | POA: Diagnosis not present

## 2018-12-25 DIAGNOSIS — E1122 Type 2 diabetes mellitus with diabetic chronic kidney disease: Secondary | ICD-10-CM | POA: Diagnosis not present

## 2018-12-25 DIAGNOSIS — Z79899 Other long term (current) drug therapy: Secondary | ICD-10-CM | POA: Diagnosis not present

## 2018-12-25 DIAGNOSIS — I440 Atrioventricular block, first degree: Secondary | ICD-10-CM | POA: Diagnosis not present

## 2018-12-25 DIAGNOSIS — N183 Chronic kidney disease, stage 3 (moderate): Secondary | ICD-10-CM | POA: Diagnosis not present

## 2018-12-25 DIAGNOSIS — I480 Paroxysmal atrial fibrillation: Secondary | ICD-10-CM | POA: Diagnosis not present

## 2018-12-25 DIAGNOSIS — R9431 Abnormal electrocardiogram [ECG] [EKG]: Secondary | ICD-10-CM | POA: Diagnosis not present

## 2018-12-25 DIAGNOSIS — G4733 Obstructive sleep apnea (adult) (pediatric): Secondary | ICD-10-CM | POA: Diagnosis not present

## 2018-12-25 DIAGNOSIS — I4891 Unspecified atrial fibrillation: Secondary | ICD-10-CM | POA: Diagnosis not present

## 2018-12-25 DIAGNOSIS — E1142 Type 2 diabetes mellitus with diabetic polyneuropathy: Secondary | ICD-10-CM | POA: Diagnosis not present

## 2018-12-25 DIAGNOSIS — Z7901 Long term (current) use of anticoagulants: Secondary | ICD-10-CM | POA: Diagnosis not present

## 2018-12-25 DIAGNOSIS — Z8673 Personal history of transient ischemic attack (TIA), and cerebral infarction without residual deficits: Secondary | ICD-10-CM | POA: Diagnosis not present

## 2018-12-31 DIAGNOSIS — F324 Major depressive disorder, single episode, in partial remission: Secondary | ICD-10-CM | POA: Diagnosis not present

## 2018-12-31 DIAGNOSIS — E669 Obesity, unspecified: Secondary | ICD-10-CM | POA: Diagnosis not present

## 2018-12-31 DIAGNOSIS — M810 Age-related osteoporosis without current pathological fracture: Secondary | ICD-10-CM | POA: Diagnosis not present

## 2018-12-31 DIAGNOSIS — E1121 Type 2 diabetes mellitus with diabetic nephropathy: Secondary | ICD-10-CM | POA: Diagnosis not present

## 2018-12-31 DIAGNOSIS — I4891 Unspecified atrial fibrillation: Secondary | ICD-10-CM | POA: Diagnosis not present

## 2018-12-31 DIAGNOSIS — N184 Chronic kidney disease, stage 4 (severe): Secondary | ICD-10-CM | POA: Diagnosis not present

## 2018-12-31 DIAGNOSIS — Z6834 Body mass index (BMI) 34.0-34.9, adult: Secondary | ICD-10-CM | POA: Diagnosis not present

## 2018-12-31 DIAGNOSIS — Z794 Long term (current) use of insulin: Secondary | ICD-10-CM | POA: Diagnosis not present

## 2019-01-29 DIAGNOSIS — Z79899 Other long term (current) drug therapy: Secondary | ICD-10-CM | POA: Diagnosis not present

## 2019-01-29 DIAGNOSIS — E1142 Type 2 diabetes mellitus with diabetic polyneuropathy: Secondary | ICD-10-CM | POA: Diagnosis not present

## 2019-01-29 DIAGNOSIS — Z7901 Long term (current) use of anticoagulants: Secondary | ICD-10-CM | POA: Diagnosis not present

## 2019-01-29 DIAGNOSIS — G4733 Obstructive sleep apnea (adult) (pediatric): Secondary | ICD-10-CM | POA: Diagnosis not present

## 2019-01-29 DIAGNOSIS — N183 Chronic kidney disease, stage 3 (moderate): Secondary | ICD-10-CM | POA: Diagnosis not present

## 2019-01-29 DIAGNOSIS — I480 Paroxysmal atrial fibrillation: Secondary | ICD-10-CM | POA: Diagnosis not present

## 2019-01-29 DIAGNOSIS — I129 Hypertensive chronic kidney disease with stage 1 through stage 4 chronic kidney disease, or unspecified chronic kidney disease: Secondary | ICD-10-CM | POA: Diagnosis not present

## 2019-02-02 DIAGNOSIS — I8001 Phlebitis and thrombophlebitis of superficial vessels of right lower extremity: Secondary | ICD-10-CM | POA: Diagnosis not present

## 2019-02-09 DIAGNOSIS — I480 Paroxysmal atrial fibrillation: Secondary | ICD-10-CM | POA: Diagnosis not present

## 2019-02-23 DIAGNOSIS — M25552 Pain in left hip: Secondary | ICD-10-CM | POA: Diagnosis not present

## 2019-04-02 DIAGNOSIS — I671 Cerebral aneurysm, nonruptured: Secondary | ICD-10-CM | POA: Diagnosis not present

## 2019-04-02 DIAGNOSIS — E1121 Type 2 diabetes mellitus with diabetic nephropathy: Secondary | ICD-10-CM | POA: Diagnosis not present

## 2019-04-02 DIAGNOSIS — G4733 Obstructive sleep apnea (adult) (pediatric): Secondary | ICD-10-CM | POA: Diagnosis not present

## 2019-04-02 DIAGNOSIS — E7800 Pure hypercholesterolemia, unspecified: Secondary | ICD-10-CM | POA: Diagnosis not present

## 2019-04-02 DIAGNOSIS — E669 Obesity, unspecified: Secondary | ICD-10-CM | POA: Diagnosis not present

## 2019-04-02 DIAGNOSIS — Z794 Long term (current) use of insulin: Secondary | ICD-10-CM | POA: Diagnosis not present

## 2019-04-02 DIAGNOSIS — Z6834 Body mass index (BMI) 34.0-34.9, adult: Secondary | ICD-10-CM | POA: Diagnosis not present

## 2019-04-02 DIAGNOSIS — I4891 Unspecified atrial fibrillation: Secondary | ICD-10-CM | POA: Diagnosis not present

## 2019-04-03 DIAGNOSIS — E1165 Type 2 diabetes mellitus with hyperglycemia: Secondary | ICD-10-CM | POA: Diagnosis not present

## 2019-04-03 DIAGNOSIS — Z79899 Other long term (current) drug therapy: Secondary | ICD-10-CM | POA: Diagnosis not present

## 2019-04-16 DIAGNOSIS — G4733 Obstructive sleep apnea (adult) (pediatric): Secondary | ICD-10-CM | POA: Diagnosis not present

## 2019-04-16 DIAGNOSIS — E669 Obesity, unspecified: Secondary | ICD-10-CM | POA: Diagnosis not present

## 2019-04-16 DIAGNOSIS — F330 Major depressive disorder, recurrent, mild: Secondary | ICD-10-CM | POA: Diagnosis not present

## 2019-04-16 DIAGNOSIS — Z6834 Body mass index (BMI) 34.0-34.9, adult: Secondary | ICD-10-CM | POA: Diagnosis not present

## 2019-04-16 DIAGNOSIS — Z0000 Encounter for general adult medical examination without abnormal findings: Secondary | ICD-10-CM | POA: Diagnosis not present

## 2019-04-17 DIAGNOSIS — G319 Degenerative disease of nervous system, unspecified: Secondary | ICD-10-CM | POA: Diagnosis not present

## 2019-04-17 DIAGNOSIS — I671 Cerebral aneurysm, nonruptured: Secondary | ICD-10-CM | POA: Diagnosis not present

## 2019-04-28 DIAGNOSIS — E1122 Type 2 diabetes mellitus with diabetic chronic kidney disease: Secondary | ICD-10-CM | POA: Diagnosis not present

## 2019-04-28 DIAGNOSIS — F40240 Claustrophobia: Secondary | ICD-10-CM | POA: Diagnosis not present

## 2019-04-28 DIAGNOSIS — I129 Hypertensive chronic kidney disease with stage 1 through stage 4 chronic kidney disease, or unspecified chronic kidney disease: Secondary | ICD-10-CM | POA: Diagnosis not present

## 2019-04-28 DIAGNOSIS — I671 Cerebral aneurysm, nonruptured: Secondary | ICD-10-CM | POA: Diagnosis not present

## 2019-04-28 DIAGNOSIS — N189 Chronic kidney disease, unspecified: Secondary | ICD-10-CM | POA: Diagnosis not present

## 2019-05-01 ENCOUNTER — Ambulatory Visit: Payer: Medicare Other | Admitting: Sports Medicine

## 2019-05-01 DIAGNOSIS — G4733 Obstructive sleep apnea (adult) (pediatric): Secondary | ICD-10-CM | POA: Diagnosis not present

## 2019-05-06 DIAGNOSIS — G4733 Obstructive sleep apnea (adult) (pediatric): Secondary | ICD-10-CM | POA: Diagnosis not present

## 2019-05-27 DIAGNOSIS — D1801 Hemangioma of skin and subcutaneous tissue: Secondary | ICD-10-CM | POA: Diagnosis not present

## 2019-05-27 DIAGNOSIS — L820 Inflamed seborrheic keratosis: Secondary | ICD-10-CM | POA: Diagnosis not present

## 2019-05-27 DIAGNOSIS — L578 Other skin changes due to chronic exposure to nonionizing radiation: Secondary | ICD-10-CM | POA: Diagnosis not present

## 2019-06-03 ENCOUNTER — Encounter: Payer: Self-pay | Admitting: Sports Medicine

## 2019-06-03 ENCOUNTER — Other Ambulatory Visit: Payer: Self-pay

## 2019-06-03 ENCOUNTER — Ambulatory Visit (INDEPENDENT_AMBULATORY_CARE_PROVIDER_SITE_OTHER): Payer: PPO | Admitting: Sports Medicine

## 2019-06-03 VITALS — Temp 97.4°F | Resp 16

## 2019-06-03 DIAGNOSIS — M79675 Pain in left toe(s): Secondary | ICD-10-CM | POA: Diagnosis not present

## 2019-06-03 DIAGNOSIS — Z23 Encounter for immunization: Secondary | ICD-10-CM | POA: Diagnosis not present

## 2019-06-03 DIAGNOSIS — M79674 Pain in right toe(s): Secondary | ICD-10-CM

## 2019-06-03 DIAGNOSIS — B351 Tinea unguium: Secondary | ICD-10-CM | POA: Diagnosis not present

## 2019-06-03 DIAGNOSIS — Z7901 Long term (current) use of anticoagulants: Secondary | ICD-10-CM

## 2019-06-03 DIAGNOSIS — E1142 Type 2 diabetes mellitus with diabetic polyneuropathy: Secondary | ICD-10-CM

## 2019-06-03 NOTE — Progress Notes (Signed)
Subjective: Heather [name] is a 73 y.o. female patient with history of diabetes who presents to office today complaining of long,mildly painful nails especially at the corners of her left third and right third toes while ambulating in shoes; unable to trim. Patient states that the glucose reading this morning was not recorded.  Patient denies any new changes in medication or new problems. Patient admits to baseline numbness to toes and hypersensitivity with the toes still very sensitive and is having a difficult time discerning if this is her normal neuropathic pain versus ingrown toenail pain.  Patient requests diabetic shoes as well.  Reports that she only has 1 pair sandals that she can wear comfortably.  Patient denies any other issues at this time.  PCP Dr. Burnett Sheng last seen in May  Patient Active Problem List   Diagnosis Date Noted  . Breast cancer in situ 04/17/2016  . Chronic kidney disease 04/17/2016  . Acute onset aura migraine 04/17/2016  . Fasciculation 04/17/2016  . Obstructive apnea 04/17/2016  . Restless leg 04/17/2016  . Long term current use of anticoagulant 05/30/2015  . Other long term (current) drug therapy 05/30/2015  . Atrial fibrillation ([place]) 08/14/2014  . Type II or unspecified type diabetes mellitus with peripheral circulatory disorders, uncontrolled(250.72) 05/15/2013  . Pains, foot 05/15/2013  . Diabetic peripheral neuropathy ([place]) 05/15/2013  . Morbid obesity ([place]) 05/15/2013  . Bilateral leg edema 05/15/2013  . Edema of foot 05/15/2013  . Diabetes mellitus ([place]) 05/15/2013  . Foot pain 05/15/2013  . Osteoarthritis   . Proliferative diabetic retinopathy of both eyes ([place])   . Bell's palsy    Current Outpatient Medications on File Prior to Visit  Medication Sig Dispense Refill  . acetaminophen (TYLENOL) 500 MG tablet Take 500 mg by mouth every 6 (six) hours as needed.    Marland Kitchen apixaban (ELIQUIS) 5 MG TABS tablet Take 1 tablet (5 mg total) by mouth 2 (two) times  daily. 60 tablet 5  . atorvastatin (LIPITOR) 80 MG tablet Take 80 mg by mouth daily.     . Cholecalciferol 2000 UNITS TABS Take 2,000 Units by mouth daily.     . digoxin (LANOXIN) 0.25 MG tablet Take 0.25 mg by mouth daily.    . DULoxetine (CYMBALTA) 30 MG capsule Take 30 mg by mouth daily.    . INVOKANA 100 MG TABS Take 100 mg by mouth daily before breakfast.     . lisinopril (PRINIVIL,ZESTRIL) 5 MG tablet Take 5 mg by mouth daily.    . metoprolol (LOPRESSOR) 50 MG tablet Take 50 mg by mouth 2 (two) times daily.    . Multiple Vitamins-Minerals (WOMENS MULTIVITAMIN PLUS PO) Take 1 tablet by mouth daily.     Marland Kitchen oxybutynin (DITROPAN) 5 MG tablet Take 5 mg by mouth 2 (two) times daily.    Marland Kitchen oxyCODONE (ROXICODONE) 5 MG immediate release tablet Take 1 tablet (5 mg total) by mouth every 4 (four) hours as needed for moderate pain or severe pain. 30 tablet 0  . pantoprazole (PROTONIX) 40 MG tablet Take 40 mg by mouth daily.     Marland Kitchen rOPINIRole (REQUIP) 1 MG tablet Take 4 mg by mouth daily.     Marland Kitchen tiZANidine (ZANAFLEX) 4 MG tablet Take 8 mg by mouth every 8 (eight) hours as needed (Pain).      No current facility-administered medications on file prior to visit.    Allergies  Allergen Reactions  . Sulfa Antibiotics Itching  . Sulfasalazine Itching    No  results found for this or any previous visit (from the past 2160 hour(s)).  Objective: General: Patient is awake, alert, and oriented x 3 and in no acute distress.  Integument: Skin is warm, dry and supple bilateral. Nails are tender, long, thickened and  dystrophic with subungual debris, consistent with onychomycosis, 1-5 bilateral with minimal incurvation noted at bilateral third toes with significant digital deformities.  Minimal callus sub-met 1 on right.  Old surgical scars well-healed. No signs of infection. No open lesions or preulcerative lesions present bilateral. Remaining integument unremarkable.  Vasculature:  Dorsalis Pedis pulse 1/4  bilateral. Posterior Tibial pulse  1/4 bilateral.  Capillary fill time <3 sec 1-5 bilateral. Scant hair growth to the level of the digits. Temperature gradient within normal limits. No varicosities present bilateral. No edema present bilateral.   Neurology: The patient has intact sensation measured with a 5.07/10g Semmes Weinstein Monofilament at all pedal sites bilateral . Vibratory sensation diminished bilateral with tuning fork. No Babinski sign present bilateral.   Musculoskeletal: Asymptomatic hammertoe and pes planus deformity pedal deformities noted bilateral. Muscular strength 5/5 in all lower extremity muscular groups bilateral without pain on range of motion . No tenderness with calf compression bilateral.  Assessment and Plan: Problem List Items Addressed This Visit      Other   Long term current use of anticoagulant    Other Visit Diagnoses    Pain due to onychomycosis of toenails of both feet    -  Primary   Diabetic polyneuropathy associated with type 2 diabetes mellitus ([place])          -Examined patient. -Discussed and educated patient on diabetic foot care, especially with  regards to the vascular, neurological and musculoskeletal systems.  -Stressed the importance of good glycemic control and the detriment of not  controlling glucose levels in relation to the foot. -Mechanically debrided all nails 1-5 bilateral using sterile nail nipper and filed with dremel without incident  -Smooth callus at right foot using rotary border without incident -[employer] diabetic shoe order form was completed; office to contact primary care for approval / certification;  Office to arrange shoe fitting and dispensing. -Answered all patient questions -Patient to return  in 3 months for at risk foot care and to be measured for diabetic shoes when called -Patient advised to call the office if any problems or questions arise in the meantime.  Landis Martins, DPM

## 2019-06-06 DIAGNOSIS — G4733 Obstructive sleep apnea (adult) (pediatric): Secondary | ICD-10-CM | POA: Diagnosis not present

## 2019-06-10 DIAGNOSIS — H4323 Crystalline deposits in vitreous body, bilateral: Secondary | ICD-10-CM | POA: Diagnosis not present

## 2019-06-10 DIAGNOSIS — Z961 Presence of intraocular lens: Secondary | ICD-10-CM | POA: Diagnosis not present

## 2019-06-10 DIAGNOSIS — Z9849 Cataract extraction status, unspecified eye: Secondary | ICD-10-CM | POA: Diagnosis not present

## 2019-06-10 DIAGNOSIS — H43813 Vitreous degeneration, bilateral: Secondary | ICD-10-CM | POA: Diagnosis not present

## 2019-06-10 DIAGNOSIS — E119 Type 2 diabetes mellitus without complications: Secondary | ICD-10-CM | POA: Diagnosis not present

## 2019-06-10 DIAGNOSIS — H16223 Keratoconjunctivitis sicca, not specified as Sjogren's, bilateral: Secondary | ICD-10-CM | POA: Diagnosis not present

## 2019-06-11 DIAGNOSIS — R0602 Shortness of breath: Secondary | ICD-10-CM | POA: Diagnosis not present

## 2019-06-11 DIAGNOSIS — R069 Unspecified abnormalities of breathing: Secondary | ICD-10-CM | POA: Diagnosis not present

## 2019-06-12 DIAGNOSIS — E669 Obesity, unspecified: Secondary | ICD-10-CM | POA: Diagnosis not present

## 2019-06-12 DIAGNOSIS — R0989 Other specified symptoms and signs involving the circulatory and respiratory systems: Secondary | ICD-10-CM | POA: Diagnosis not present

## 2019-06-12 DIAGNOSIS — J811 Chronic pulmonary edema: Secondary | ICD-10-CM | POA: Diagnosis not present

## 2019-06-12 DIAGNOSIS — Z7901 Long term (current) use of anticoagulants: Secondary | ICD-10-CM | POA: Diagnosis not present

## 2019-06-12 DIAGNOSIS — Z794 Long term (current) use of insulin: Secondary | ICD-10-CM | POA: Diagnosis not present

## 2019-06-12 DIAGNOSIS — N184 Chronic kidney disease, stage 4 (severe): Secondary | ICD-10-CM | POA: Diagnosis not present

## 2019-06-12 DIAGNOSIS — I252 Old myocardial infarction: Secondary | ICD-10-CM | POA: Diagnosis not present

## 2019-06-12 DIAGNOSIS — J9601 Acute respiratory failure with hypoxia: Secondary | ICD-10-CM | POA: Diagnosis not present

## 2019-06-12 DIAGNOSIS — E785 Hyperlipidemia, unspecified: Secondary | ICD-10-CM | POA: Diagnosis not present

## 2019-06-12 DIAGNOSIS — I517 Cardiomegaly: Secondary | ICD-10-CM | POA: Diagnosis not present

## 2019-06-12 DIAGNOSIS — I454 Nonspecific intraventricular block: Secondary | ICD-10-CM | POA: Diagnosis not present

## 2019-06-12 DIAGNOSIS — E1165 Type 2 diabetes mellitus with hyperglycemia: Secondary | ICD-10-CM | POA: Diagnosis not present

## 2019-06-12 DIAGNOSIS — I110 Hypertensive heart disease with heart failure: Secondary | ICD-10-CM | POA: Diagnosis not present

## 2019-06-12 DIAGNOSIS — E1122 Type 2 diabetes mellitus with diabetic chronic kidney disease: Secondary | ICD-10-CM | POA: Diagnosis not present

## 2019-06-12 DIAGNOSIS — Z6835 Body mass index (BMI) 35.0-35.9, adult: Secondary | ICD-10-CM | POA: Diagnosis not present

## 2019-06-12 DIAGNOSIS — I081 Rheumatic disorders of both mitral and tricuspid valves: Secondary | ICD-10-CM | POA: Diagnosis not present

## 2019-06-12 DIAGNOSIS — R918 Other nonspecific abnormal finding of lung field: Secondary | ICD-10-CM | POA: Diagnosis not present

## 2019-06-12 DIAGNOSIS — R0602 Shortness of breath: Secondary | ICD-10-CM | POA: Diagnosis not present

## 2019-06-12 DIAGNOSIS — I4891 Unspecified atrial fibrillation: Secondary | ICD-10-CM | POA: Diagnosis not present

## 2019-06-12 DIAGNOSIS — I509 Heart failure, unspecified: Secondary | ICD-10-CM | POA: Diagnosis not present

## 2019-06-12 DIAGNOSIS — I480 Paroxysmal atrial fibrillation: Secondary | ICD-10-CM | POA: Diagnosis not present

## 2019-06-12 DIAGNOSIS — Z79899 Other long term (current) drug therapy: Secondary | ICD-10-CM | POA: Diagnosis not present

## 2019-06-24 ENCOUNTER — Ambulatory Visit: Payer: PPO | Admitting: Orthotics

## 2019-06-24 ENCOUNTER — Other Ambulatory Visit: Payer: Self-pay

## 2019-06-24 DIAGNOSIS — M79674 Pain in right toe(s): Secondary | ICD-10-CM

## 2019-06-24 DIAGNOSIS — B351 Tinea unguium: Secondary | ICD-10-CM

## 2019-06-24 DIAGNOSIS — E1142 Type 2 diabetes mellitus with diabetic polyneuropathy: Secondary | ICD-10-CM

## 2019-06-24 NOTE — Progress Notes (Signed)

## 2019-07-02 DIAGNOSIS — Z6834 Body mass index (BMI) 34.0-34.9, adult: Secondary | ICD-10-CM | POA: Diagnosis not present

## 2019-07-02 DIAGNOSIS — I4891 Unspecified atrial fibrillation: Secondary | ICD-10-CM | POA: Diagnosis not present

## 2019-07-02 DIAGNOSIS — E1129 Type 2 diabetes mellitus with other diabetic kidney complication: Secondary | ICD-10-CM | POA: Diagnosis not present

## 2019-07-02 DIAGNOSIS — N184 Chronic kidney disease, stage 4 (severe): Secondary | ICD-10-CM | POA: Diagnosis not present

## 2019-07-02 DIAGNOSIS — Z79899 Other long term (current) drug therapy: Secondary | ICD-10-CM | POA: Diagnosis not present

## 2019-07-02 DIAGNOSIS — G4733 Obstructive sleep apnea (adult) (pediatric): Secondary | ICD-10-CM | POA: Diagnosis not present

## 2019-07-02 DIAGNOSIS — E669 Obesity, unspecified: Secondary | ICD-10-CM | POA: Diagnosis not present

## 2019-07-02 DIAGNOSIS — Z794 Long term (current) use of insulin: Secondary | ICD-10-CM | POA: Diagnosis not present

## 2019-07-02 DIAGNOSIS — R5383 Other fatigue: Secondary | ICD-10-CM | POA: Diagnosis not present

## 2019-07-06 ENCOUNTER — Other Ambulatory Visit: Payer: Self-pay | Admitting: Pharmacy Technician

## 2019-07-06 ENCOUNTER — Other Ambulatory Visit: Payer: Self-pay | Admitting: Pharmacist

## 2019-07-06 DIAGNOSIS — G4733 Obstructive sleep apnea (adult) (pediatric): Secondary | ICD-10-CM | POA: Diagnosis not present

## 2019-07-06 NOTE — Patient Outreach (Signed)
[facility]) Care Management  07/06/2019  Domino Holten 07/21/1946 [id number]                          Medication Assistance Referral  Referral From: [place] / [employer] Patient application portion: Emailed to [email or web address] Provider application portion: Faxed  to Dr. Charleen Kirks   Follow up:  Will follow up with patient in 3-5 business days to confirm application(s) have been received.  Maud Deed Chana Bode Carrizales Certified Pharmacy Technician [facility] Management Direct [phone]

## 2019-07-06 NOTE — Patient Outreach (Signed)
[facility] ([facility]) Care Management  [facility]   07/06/2019  Icelyn Rojek 02/08/1946 [id number]  Reason for referral: Medication Assistance  Referral source: [employer] Current insurance: [employer]  PMHx includes but not limited to:  PAF on chronic anticoagulation, pulmonary edema, CKD-IV, T2DM, recent hospitalization for SOB, orthopnea, likely 2/2 fluid overload due to Afib per notes.   Outreach:  Successful telephone call with patient.  HIPAA identifiers verified.   Subjective:  Patient reports she was started on furosemide after recent hospitalization but this was recently changed to PRN after appt with PCP.    Objective: The 10-year ASCVD risk score (Goff DC Jr., et al., 2013) is: 24.6%   Values used to calculate the score:     Age: 73 years     Sex: Female     Is Non-Hispanic African American: No     Diabetic: Yes     Tobacco smoker: No     Systolic Blood Pressure: 112 mmHg     Is BP treated: Yes     HDL Cholesterol: 35 mg/dL     Total Cholesterol: 182 mg/dL  Lab Results  Component Value Date   CREATININE 1.80 (H) 01/19/2016   CREATININE 1.48 (H) 08/14/2014   CREATININE 1.23 (H) 05/15/2013    Lab Results  Component Value Date   HGBA1C 6.8 05/15/2013    Lipid Panel  No results found for: CHOL, TRIG, HDL, CHOLHDL, VLDL, LDLCALC, LDLDIRECT  BP Readings from Last 3 Encounters:  01/19/16 (!) 158/76  09/14/15 (!) 163/72  08/15/14 (!) 147/41    Allergies  Allergen Reactions  . Sulfa Antibiotics Itching  . Sulfasalazine Itching    Assessment: Drugs sorted by system:  Neurologic/Psychologic:duloxetine, ropinirole, zolpidem  Hematologic: apixaban  Cardiovascular: lisinopril, metoprolol, rosuvastatin  Endocrine: glimepiride, insulin detemir  Pain: acetaminophen, tizanidine, tramadol  Vitamins/Minerals/Supplements: cholecalciferol, MVI  Medication Review Findings:  . Eliquis: reviewed signs and symptoms of stroke,  monitoring for possible bleeding, dose ok per review of kidney function, age, weight . Zolpidem: 10mg dose higher than recommended in PI, patient reports she has been on this dose for many years, MD ok with dose  Medication Assistance Findings:  Medication assistance needs identified: Eliquis, Levemir  Extra Help:  Not eligible for Extra Help Low Income Subsidy based on reported income and assets  Patient Assistance Programs: Eliquis made by BMS o Income requirement met: No o Out-of-pocket prescription expenditure met:   Unknown - Patient has not met application requirements to apply for this program at this time.    Levemir made by Novo Nordisk o Income requirement met: Yes o Out-of-pocket prescription expenditure met:   Unknown - Patient has met application requirements to apply for this program.  - Reviewed program requirements with patient.    Plan: . I will route patient assistance letter to [facility] pharmacy technician who will coordinate patient assistance program application process for medications listed above.  [facility] pharmacy technician will assist with obtaining all required documents from both patient and provider(s) and submit application(s) once completed.   . Patient requests application be emailed to her at [email or web address]  Colleen Summe, PharmD, BCPS Clinical Pharmacist [facility] [phone]       

## 2019-07-08 ENCOUNTER — Other Ambulatory Visit: Payer: Self-pay | Admitting: Pharmacy Technician

## 2019-07-08 NOTE — Patient Outreach (Signed)
[place] [facility]) Care Management  07/08/2019  Heather [name] February 11, 1946 [id number]   Received patient and provider portion(s) of patient assistance application for Levemir. Faxed completed application and required documents into [employer].  Will follow up with company in 3-5 business days to check status of application.  Maud Deed Chana Bode [place] Certified Pharmacy Technician [place] Management Direct [phone]

## 2019-07-22 ENCOUNTER — Other Ambulatory Visit: Payer: Self-pay | Admitting: Pharmacy Technician

## 2019-07-22 NOTE — Patient Outreach (Signed)
[place] [facility]) Care Management  07/22/2019  Heather [name] 04-Oct-1946 [id number]    Follow up call placed to [employer]  regarding patient assistance application(s) for Levemir , [name] confirms patient has been approved as of 8/5 until 12/10/19. Medication to arrive at providers office 10/14 business days from approval date.   Successful call placed to patient regarding patient assistance update for Levemir, HIPAA identifiers verified. Informed patient of above details. [name] states that she started her last pen last night. Emailed her link to [employer] immediate supply so that she can take to pharmacy to get free supply of medication until her approved meds are shipped.  Follow up:  Will follow up with patient in 7-10 business days to confirm meds have been received.  Maud Deed Chana Bode [place] Certified Pharmacy Technician [place] Management Direct Dial:[phone]

## 2019-07-31 DIAGNOSIS — B372 Candidiasis of skin and nail: Secondary | ICD-10-CM | POA: Diagnosis not present

## 2019-07-31 DIAGNOSIS — F329 Major depressive disorder, single episode, unspecified: Secondary | ICD-10-CM | POA: Diagnosis not present

## 2019-07-31 DIAGNOSIS — N3000 Acute cystitis without hematuria: Secondary | ICD-10-CM | POA: Diagnosis not present

## 2019-08-06 DIAGNOSIS — G4733 Obstructive sleep apnea (adult) (pediatric): Secondary | ICD-10-CM | POA: Diagnosis not present

## 2019-08-10 ENCOUNTER — Other Ambulatory Visit: Payer: Self-pay | Admitting: Pharmacist

## 2019-08-10 ENCOUNTER — Other Ambulatory Visit: Payer: Self-pay | Admitting: Pharmacy Technician

## 2019-08-10 NOTE — Patient Outreach (Signed)
[place] [facility]) Care Management  08/10/2019  [name] September 23, 1946 [id number]    Successful call placed to patient regarding patient assistance medication delivery of Levemir, HIPAA identifiers verified. Heather [name] confirms that she received her Levemir from [employer]. Reviewed with patient on how to obtain refills and requested that she contact me with any issues.   [name] asked about patient assistance for 2021 and informed her that she should inform [employer] (service provided by insurance [facility] Team [employer]age)) to request information on their process for patient assistance.  Follow up:  Will route note to [place] for case closure  Maud Deed. Chana Bode [place] Certified Pharmacy Technician [place] Management Direct [phone]

## 2019-08-10 NOTE — Patient Outreach (Signed)
[place] [facility]) Care Management [place]  08/10/2019  Angle Dirusso 1946-04-10 [id number]  Patient has been approved for Levemir patient assistance program(s).  Patient has been instructed on how to order refills and renewal process for 2020.    Plan: [place] case is being closed due to the following reasons: -Goals of care have been met. -I have provided my contact information if patient or family needs to reach out to me in the future.  -Thank you for allowing [facility] pharmacy to be involved in this patient's care.    Ralene Bathe, PharmD, [place] [phone]

## 2019-08-13 DIAGNOSIS — N183 Chronic kidney disease, stage 3 (moderate): Secondary | ICD-10-CM | POA: Diagnosis not present

## 2019-08-13 DIAGNOSIS — Z794 Long term (current) use of insulin: Secondary | ICD-10-CM | POA: Diagnosis not present

## 2019-08-13 DIAGNOSIS — I4821 Permanent atrial fibrillation: Secondary | ICD-10-CM | POA: Diagnosis not present

## 2019-08-13 DIAGNOSIS — I5032 Chronic diastolic (congestive) heart failure: Secondary | ICD-10-CM | POA: Diagnosis not present

## 2019-08-13 DIAGNOSIS — I4891 Unspecified atrial fibrillation: Secondary | ICD-10-CM | POA: Diagnosis not present

## 2019-08-13 DIAGNOSIS — E119 Type 2 diabetes mellitus without complications: Secondary | ICD-10-CM | POA: Diagnosis not present

## 2019-09-02 ENCOUNTER — Ambulatory Visit: Payer: PPO | Admitting: Sports Medicine

## 2019-09-06 DIAGNOSIS — G4733 Obstructive sleep apnea (adult) (pediatric): Secondary | ICD-10-CM | POA: Diagnosis not present

## 2019-09-08 DIAGNOSIS — R531 Weakness: Secondary | ICD-10-CM | POA: Diagnosis not present

## 2019-09-08 DIAGNOSIS — N3000 Acute cystitis without hematuria: Secondary | ICD-10-CM | POA: Diagnosis not present

## 2019-09-09 DIAGNOSIS — R531 Weakness: Secondary | ICD-10-CM | POA: Diagnosis not present

## 2019-09-09 DIAGNOSIS — R51 Headache: Secondary | ICD-10-CM | POA: Diagnosis not present

## 2019-09-16 DIAGNOSIS — I10 Essential (primary) hypertension: Secondary | ICD-10-CM | POA: Diagnosis not present

## 2019-09-17 DIAGNOSIS — E119 Type 2 diabetes mellitus without complications: Secondary | ICD-10-CM | POA: Diagnosis not present

## 2019-09-17 DIAGNOSIS — I110 Hypertensive heart disease with heart failure: Secondary | ICD-10-CM | POA: Diagnosis not present

## 2019-09-17 DIAGNOSIS — I5030 Unspecified diastolic (congestive) heart failure: Secondary | ICD-10-CM | POA: Diagnosis not present

## 2019-09-17 DIAGNOSIS — I4821 Permanent atrial fibrillation: Secondary | ICD-10-CM | POA: Diagnosis not present

## 2019-09-17 DIAGNOSIS — I340 Nonrheumatic mitral (valve) insufficiency: Secondary | ICD-10-CM | POA: Diagnosis not present

## 2019-09-22 DIAGNOSIS — I4891 Unspecified atrial fibrillation: Secondary | ICD-10-CM | POA: Diagnosis not present

## 2019-09-22 DIAGNOSIS — N189 Chronic kidney disease, unspecified: Secondary | ICD-10-CM | POA: Diagnosis not present

## 2019-09-22 DIAGNOSIS — E860 Dehydration: Secondary | ICD-10-CM | POA: Diagnosis not present

## 2019-09-22 DIAGNOSIS — R42 Dizziness and giddiness: Secondary | ICD-10-CM | POA: Diagnosis not present

## 2019-09-22 DIAGNOSIS — I129 Hypertensive chronic kidney disease with stage 1 through stage 4 chronic kidney disease, or unspecified chronic kidney disease: Secondary | ICD-10-CM | POA: Diagnosis not present

## 2019-09-22 DIAGNOSIS — Z9189 Other specified personal risk factors, not elsewhere classified: Secondary | ICD-10-CM | POA: Diagnosis not present

## 2019-09-23 DIAGNOSIS — I4891 Unspecified atrial fibrillation: Secondary | ICD-10-CM | POA: Diagnosis not present

## 2019-10-06 DIAGNOSIS — G4733 Obstructive sleep apnea (adult) (pediatric): Secondary | ICD-10-CM | POA: Diagnosis not present

## 2019-10-07 DIAGNOSIS — Z23 Encounter for immunization: Secondary | ICD-10-CM | POA: Diagnosis not present

## 2019-10-07 DIAGNOSIS — E669 Obesity, unspecified: Secondary | ICD-10-CM | POA: Diagnosis not present

## 2019-10-07 DIAGNOSIS — Z6833 Body mass index (BMI) 33.0-33.9, adult: Secondary | ICD-10-CM | POA: Diagnosis not present

## 2019-10-07 DIAGNOSIS — E1121 Type 2 diabetes mellitus with diabetic nephropathy: Secondary | ICD-10-CM | POA: Diagnosis not present

## 2019-10-07 DIAGNOSIS — R531 Weakness: Secondary | ICD-10-CM | POA: Diagnosis not present

## 2019-10-07 DIAGNOSIS — N184 Chronic kidney disease, stage 4 (severe): Secondary | ICD-10-CM | POA: Diagnosis not present

## 2019-10-07 DIAGNOSIS — I4891 Unspecified atrial fibrillation: Secondary | ICD-10-CM | POA: Diagnosis not present

## 2019-10-08 DIAGNOSIS — I130 Hypertensive heart and chronic kidney disease with heart failure and stage 1 through stage 4 chronic kidney disease, or unspecified chronic kidney disease: Secondary | ICD-10-CM | POA: Diagnosis not present

## 2019-10-08 DIAGNOSIS — N179 Acute kidney failure, unspecified: Secondary | ICD-10-CM | POA: Diagnosis not present

## 2019-10-08 DIAGNOSIS — E1165 Type 2 diabetes mellitus with hyperglycemia: Secondary | ICD-10-CM | POA: Diagnosis not present

## 2019-10-08 DIAGNOSIS — E1122 Type 2 diabetes mellitus with diabetic chronic kidney disease: Secondary | ICD-10-CM | POA: Diagnosis not present

## 2019-10-08 DIAGNOSIS — Z7984 Long term (current) use of oral hypoglycemic drugs: Secondary | ICD-10-CM | POA: Diagnosis not present

## 2019-10-08 DIAGNOSIS — Z794 Long term (current) use of insulin: Secondary | ICD-10-CM | POA: Diagnosis not present

## 2019-10-08 DIAGNOSIS — Z79899 Other long term (current) drug therapy: Secondary | ICD-10-CM | POA: Diagnosis not present

## 2019-10-08 DIAGNOSIS — I5032 Chronic diastolic (congestive) heart failure: Secondary | ICD-10-CM | POA: Diagnosis not present

## 2019-10-08 DIAGNOSIS — Z7901 Long term (current) use of anticoagulants: Secondary | ICD-10-CM | POA: Diagnosis not present

## 2019-10-08 DIAGNOSIS — I480 Paroxysmal atrial fibrillation: Secondary | ICD-10-CM | POA: Diagnosis not present

## 2019-10-08 DIAGNOSIS — N184 Chronic kidney disease, stage 4 (severe): Secondary | ICD-10-CM | POA: Diagnosis not present

## 2019-10-12 DIAGNOSIS — N261 Atrophy of kidney (terminal): Secondary | ICD-10-CM | POA: Diagnosis not present

## 2019-10-12 DIAGNOSIS — N184 Chronic kidney disease, stage 4 (severe): Secondary | ICD-10-CM | POA: Diagnosis not present

## 2019-10-19 DIAGNOSIS — M25561 Pain in right knee: Secondary | ICD-10-CM | POA: Diagnosis not present

## 2019-10-19 DIAGNOSIS — M25562 Pain in left knee: Secondary | ICD-10-CM | POA: Diagnosis not present

## 2019-10-19 DIAGNOSIS — G8929 Other chronic pain: Secondary | ICD-10-CM | POA: Diagnosis not present

## 2019-10-19 DIAGNOSIS — M48062 Spinal stenosis, lumbar region with neurogenic claudication: Secondary | ICD-10-CM | POA: Diagnosis not present

## 2019-11-06 DIAGNOSIS — G4733 Obstructive sleep apnea (adult) (pediatric): Secondary | ICD-10-CM | POA: Diagnosis not present

## 2019-11-23 DIAGNOSIS — D0511 Intraductal carcinoma in situ of right breast: Secondary | ICD-10-CM | POA: Diagnosis not present

## 2019-11-27 DIAGNOSIS — Z1231 Encounter for screening mammogram for malignant neoplasm of breast: Secondary | ICD-10-CM | POA: Diagnosis not present

## 2019-12-06 DIAGNOSIS — G4733 Obstructive sleep apnea (adult) (pediatric): Secondary | ICD-10-CM | POA: Diagnosis not present

## 2019-12-11 DIAGNOSIS — G4733 Obstructive sleep apnea (adult) (pediatric): Secondary | ICD-10-CM | POA: Diagnosis not present

## 2019-12-15 DIAGNOSIS — N184 Chronic kidney disease, stage 4 (severe): Secondary | ICD-10-CM | POA: Diagnosis not present

## 2019-12-15 DIAGNOSIS — M47817 Spondylosis without myelopathy or radiculopathy, lumbosacral region: Secondary | ICD-10-CM | POA: Diagnosis not present

## 2019-12-15 DIAGNOSIS — M5126 Other intervertebral disc displacement, lumbar region: Secondary | ICD-10-CM | POA: Diagnosis not present

## 2019-12-15 DIAGNOSIS — M48062 Spinal stenosis, lumbar region with neurogenic claudication: Secondary | ICD-10-CM | POA: Diagnosis not present

## 2019-12-18 DIAGNOSIS — Z794 Long term (current) use of insulin: Secondary | ICD-10-CM | POA: Diagnosis not present

## 2019-12-18 DIAGNOSIS — E1122 Type 2 diabetes mellitus with diabetic chronic kidney disease: Secondary | ICD-10-CM | POA: Diagnosis not present

## 2019-12-18 DIAGNOSIS — I129 Hypertensive chronic kidney disease with stage 1 through stage 4 chronic kidney disease, or unspecified chronic kidney disease: Secondary | ICD-10-CM | POA: Diagnosis not present

## 2019-12-18 DIAGNOSIS — E669 Obesity, unspecified: Secondary | ICD-10-CM | POA: Diagnosis not present

## 2019-12-18 DIAGNOSIS — Z6833 Body mass index (BMI) 33.0-33.9, adult: Secondary | ICD-10-CM | POA: Diagnosis not present

## 2019-12-18 DIAGNOSIS — N184 Chronic kidney disease, stage 4 (severe): Secondary | ICD-10-CM | POA: Diagnosis not present

## 2019-12-18 DIAGNOSIS — E1165 Type 2 diabetes mellitus with hyperglycemia: Secondary | ICD-10-CM | POA: Diagnosis not present

## 2019-12-24 DIAGNOSIS — G8929 Other chronic pain: Secondary | ICD-10-CM | POA: Diagnosis not present

## 2019-12-24 DIAGNOSIS — M79605 Pain in left leg: Secondary | ICD-10-CM | POA: Diagnosis not present

## 2019-12-24 DIAGNOSIS — M25561 Pain in right knee: Secondary | ICD-10-CM | POA: Diagnosis not present

## 2019-12-24 DIAGNOSIS — M25562 Pain in left knee: Secondary | ICD-10-CM | POA: Diagnosis not present

## 2019-12-24 DIAGNOSIS — M79604 Pain in right leg: Secondary | ICD-10-CM | POA: Diagnosis not present

## 2019-12-24 DIAGNOSIS — Z96653 Presence of artificial knee joint, bilateral: Secondary | ICD-10-CM | POA: Diagnosis not present

## 2019-12-29 DIAGNOSIS — Z794 Long term (current) use of insulin: Secondary | ICD-10-CM | POA: Diagnosis not present

## 2019-12-29 DIAGNOSIS — N184 Chronic kidney disease, stage 4 (severe): Secondary | ICD-10-CM | POA: Diagnosis not present

## 2019-12-29 DIAGNOSIS — E1122 Type 2 diabetes mellitus with diabetic chronic kidney disease: Secondary | ICD-10-CM | POA: Diagnosis not present

## 2019-12-31 ENCOUNTER — Other Ambulatory Visit: Payer: PPO | Admitting: Orthotics

## 2020-01-06 DIAGNOSIS — G4733 Obstructive sleep apnea (adult) (pediatric): Secondary | ICD-10-CM | POA: Diagnosis not present

## 2020-01-18 DIAGNOSIS — J0100 Acute maxillary sinusitis, unspecified: Secondary | ICD-10-CM | POA: Diagnosis not present

## 2020-01-18 DIAGNOSIS — J069 Acute upper respiratory infection, unspecified: Secondary | ICD-10-CM | POA: Diagnosis not present

## 2020-01-20 ENCOUNTER — Other Ambulatory Visit: Payer: Self-pay

## 2020-01-20 ENCOUNTER — Ambulatory Visit (INDEPENDENT_AMBULATORY_CARE_PROVIDER_SITE_OTHER): Payer: PPO | Admitting: Orthotics

## 2020-01-20 DIAGNOSIS — E1142 Type 2 diabetes mellitus with diabetic polyneuropathy: Secondary | ICD-10-CM

## 2020-01-20 DIAGNOSIS — M2141 Flat foot [pes planus] (acquired), right foot: Secondary | ICD-10-CM

## 2020-01-20 DIAGNOSIS — M2041 Other hammer toe(s) (acquired), right foot: Secondary | ICD-10-CM

## 2020-01-20 DIAGNOSIS — M2142 Flat foot [pes planus] (acquired), left foot: Secondary | ICD-10-CM

## 2020-01-20 DIAGNOSIS — M2042 Other hammer toe(s) (acquired), left foot: Secondary | ICD-10-CM

## 2020-01-20 NOTE — Progress Notes (Signed)

## 2020-02-04 DIAGNOSIS — N184 Chronic kidney disease, stage 4 (severe): Secondary | ICD-10-CM | POA: Diagnosis not present

## 2020-02-04 DIAGNOSIS — E1121 Type 2 diabetes mellitus with diabetic nephropathy: Secondary | ICD-10-CM | POA: Diagnosis not present

## 2020-02-04 DIAGNOSIS — E7800 Pure hypercholesterolemia, unspecified: Secondary | ICD-10-CM | POA: Diagnosis not present

## 2020-02-04 DIAGNOSIS — I4891 Unspecified atrial fibrillation: Secondary | ICD-10-CM | POA: Diagnosis not present

## 2020-02-04 DIAGNOSIS — Z6832 Body mass index (BMI) 32.0-32.9, adult: Secondary | ICD-10-CM | POA: Diagnosis not present

## 2020-02-04 DIAGNOSIS — E669 Obesity, unspecified: Secondary | ICD-10-CM | POA: Diagnosis not present

## 2020-02-04 DIAGNOSIS — F324 Major depressive disorder, single episode, in partial remission: Secondary | ICD-10-CM | POA: Diagnosis not present

## 2020-02-06 DIAGNOSIS — G4733 Obstructive sleep apnea (adult) (pediatric): Secondary | ICD-10-CM | POA: Diagnosis not present

## 2020-04-25 DIAGNOSIS — H26492 Other secondary cataract, left eye: Secondary | ICD-10-CM | POA: Diagnosis not present

## 2020-04-25 DIAGNOSIS — H43812 Vitreous degeneration, left eye: Secondary | ICD-10-CM | POA: Diagnosis not present

## 2020-04-27 DIAGNOSIS — F324 Major depressive disorder, single episode, in partial remission: Secondary | ICD-10-CM | POA: Diagnosis not present

## 2020-04-27 DIAGNOSIS — Z90710 Acquired absence of both cervix and uterus: Secondary | ICD-10-CM | POA: Diagnosis not present

## 2020-04-27 DIAGNOSIS — N184 Chronic kidney disease, stage 4 (severe): Secondary | ICD-10-CM | POA: Diagnosis not present

## 2020-04-27 DIAGNOSIS — M810 Age-related osteoporosis without current pathological fracture: Secondary | ICD-10-CM | POA: Diagnosis not present

## 2020-04-27 DIAGNOSIS — Z0000 Encounter for general adult medical examination without abnormal findings: Secondary | ICD-10-CM | POA: Diagnosis not present

## 2020-04-27 DIAGNOSIS — E7800 Pure hypercholesterolemia, unspecified: Secondary | ICD-10-CM | POA: Diagnosis not present

## 2020-04-27 DIAGNOSIS — I4891 Unspecified atrial fibrillation: Secondary | ICD-10-CM | POA: Diagnosis not present

## 2020-04-27 DIAGNOSIS — E1121 Type 2 diabetes mellitus with diabetic nephropathy: Secondary | ICD-10-CM | POA: Diagnosis not present

## 2020-04-27 DIAGNOSIS — Z794 Long term (current) use of insulin: Secondary | ICD-10-CM | POA: Diagnosis not present

## 2020-04-27 DIAGNOSIS — K219 Gastro-esophageal reflux disease without esophagitis: Secondary | ICD-10-CM | POA: Diagnosis not present

## 2020-04-27 DIAGNOSIS — Z6834 Body mass index (BMI) 34.0-34.9, adult: Secondary | ICD-10-CM | POA: Diagnosis not present

## 2020-04-27 DIAGNOSIS — G2581 Restless legs syndrome: Secondary | ICD-10-CM | POA: Diagnosis not present

## 2020-04-27 DIAGNOSIS — E669 Obesity, unspecified: Secondary | ICD-10-CM | POA: Diagnosis not present

## 2020-06-01 DIAGNOSIS — M65331 Trigger finger, right middle finger: Secondary | ICD-10-CM | POA: Diagnosis not present

## 2020-06-22 ENCOUNTER — Encounter: Payer: Self-pay | Admitting: Sports Medicine

## 2020-06-22 ENCOUNTER — Ambulatory Visit: Payer: PPO | Admitting: Sports Medicine

## 2020-06-22 ENCOUNTER — Other Ambulatory Visit: Payer: Self-pay

## 2020-06-22 DIAGNOSIS — M2041 Other hammer toe(s) (acquired), right foot: Secondary | ICD-10-CM

## 2020-06-22 DIAGNOSIS — M79674 Pain in right toe(s): Secondary | ICD-10-CM | POA: Diagnosis not present

## 2020-06-22 DIAGNOSIS — M2042 Other hammer toe(s) (acquired), left foot: Secondary | ICD-10-CM

## 2020-06-22 DIAGNOSIS — E1142 Type 2 diabetes mellitus with diabetic polyneuropathy: Secondary | ICD-10-CM

## 2020-06-22 DIAGNOSIS — M79675 Pain in left toe(s): Secondary | ICD-10-CM | POA: Diagnosis not present

## 2020-06-22 DIAGNOSIS — Z7901 Long term (current) use of anticoagulants: Secondary | ICD-10-CM

## 2020-06-22 DIAGNOSIS — B351 Tinea unguium: Secondary | ICD-10-CM | POA: Diagnosis not present

## 2020-06-22 NOTE — Progress Notes (Signed)
Subjective: Heather [name] is a 74 y.o. female patient with history of diabetes who presents to office today complaining of long,mildly painful nails especially at the corners of her left third and fourth and right third toes while ambulating in shoes; unable to trim. Patient reports that she wants to consider having these toenails removed patient denies any other issues at this time.  Fasting blood sugar 62 A1c unknown PCP Dr. Burnett Sheng last seen in 3 months ago  Patient Active Problem List   Diagnosis Date Noted  . Breast cancer in situ 04/17/2016  . Chronic kidney disease 04/17/2016  . Acute onset aura migraine 04/17/2016  . Fasciculation 04/17/2016  . Obstructive apnea 04/17/2016  . Restless leg 04/17/2016  . Long term current use of anticoagulant 05/30/2015  . Other long term (current) drug therapy 05/30/2015  . Atrial fibrillation ([place]) 08/14/2014  . Type II or unspecified type diabetes mellitus with peripheral circulatory disorders, uncontrolled(250.72) 05/15/2013  . Pains, foot 05/15/2013  . Diabetic peripheral neuropathy ([place]) 05/15/2013  . Morbid obesity ([place]) 05/15/2013  . Bilateral leg edema 05/15/2013  . Edema of foot 05/15/2013  . Diabetes mellitus ([place]) 05/15/2013  . Foot pain 05/15/2013  . Osteoarthritis   . Proliferative diabetic retinopathy of both eyes ([place])   . Bell's palsy    Current Outpatient Medications on File Prior to Visit  Medication Sig Dispense Refill  . acetaminophen (TYLENOL) 500 MG tablet Take 500 mg by mouth every 6 (six) hours as needed.    Marland Kitchen apixaban (ELIQUIS) 5 MG TABS tablet Take 1 tablet (5 mg total) by mouth 2 (two) times daily. 60 tablet 5  . Cholecalciferol 2000 UNITS TABS Take 2,000 Units by mouth daily.     . DULoxetine (CYMBALTA) 30 MG capsule Take 30 mg by mouth daily.    Marland Kitchen glimepiride (AMARYL) 2 MG tablet Take 2 mg by mouth 2 (two) times a day.    . insulin detemir (LEVEMIR) 100 UNIT/ML injection Inject 35-40 Units into the skin daily.  If CBGs > 200, will take 40 units If CBGs 135-199, will take 35 units If CBGs < 135, does not take insulin    . metoprolol (LOPRESSOR) 50 MG tablet Take 25 mg by mouth 2 (two) times daily.     . Multiple Vitamins-Minerals (WOMENS MULTIVITAMIN PLUS PO) Take 1 tablet by mouth daily.     . ropinirole (REQUIP) 5 MG tablet Take 2.5 mg by mouth 2 (two) times a day.     . rosuvastatin (CRESTOR) 40 MG tablet Take 40 mg by mouth daily.     No current facility-administered medications on file prior to visit.   Allergies  Allergen Reactions  . Sulfa Antibiotics Itching  . Sulfasalazine Itching    No results found for this or any previous visit (from the past 2160 hour(s)).  Objective: General: Patient is awake, alert, and oriented x 3 and in no acute distress.  Integument: Skin is warm, dry and supple bilateral. Nails are tender, long, thickened and dystrophic with subungual debris, consistent with onychomycosis, 1-5 bilateral with minimal incurvation noted at bilateral third toes and the left fourth toe with significant digital deformities.  Minimal callus sub-met 1 on right.  Old surgical scars well-healed. No signs of infection.  Vasculature:  Dorsalis Pedis pulse 1/4 bilateral. Posterior Tibial pulse  1/4 bilateral.  Capillary fill time <3 sec 1-5 bilateral. Scant hair growth to the level of the digits. Temperature gradient within normal limits.  Moderate varicosities present bilateral. No edema  present bilateral.   Neurology: The patient has intact sensation measured with a 5.07/10g Semmes Weinstein Monofilament at all pedal sites bilateral . Vibratory sensation diminished bilateral with tuning fork. No Babinski sign present bilateral.   Musculoskeletal: Asymptomatic hammertoe and pes planus deformity pedal deformities noted bilateral. Muscular strength 5/5 in all lower extremity muscular groups bilateral without pain on range of motion . No tenderness with calf compression  bilateral.  Assessment and Plan: Problem List Items Addressed This Visit      Other   Long term current use of anticoagulant    Other Visit Diagnoses    Pain due to onychomycosis of toenails of both feet    -  Primary   Diabetic polyneuropathy associated with type 2 diabetes mellitus (HCC)       Hammertoes of both feet          -Examined patient. -Discussed and educated patient on diabetic foot care, especially with  regards to the vascular, neurological and musculoskeletal systems.  -Discussed with patient due to her use of blood thinner do not recommend a nail removal procedure at this time unless we have medical clearance from her PCP or cardiologist -Mechanically debrided all nails 1-5 bilateral using sterile nail nipper and filed with dremel without incident  -Smooth callus at right foot using rotary bur without incident -Dispensed foot miracle cream -Dispensed toe caps for hammertoes -Answered all patient questions -Patient to return for nail procedure pending medical clearance -Patient advised to call the office if any problems or questions arise in the meantime.  Landis Martins, DPM

## 2020-08-01 DIAGNOSIS — E119 Type 2 diabetes mellitus without complications: Secondary | ICD-10-CM | POA: Diagnosis not present

## 2020-08-04 DIAGNOSIS — J0100 Acute maxillary sinusitis, unspecified: Secondary | ICD-10-CM | POA: Diagnosis not present

## 2020-08-05 ENCOUNTER — Ambulatory Visit: Payer: PPO | Admitting: Sports Medicine

## 2020-08-22 DIAGNOSIS — I10 Essential (primary) hypertension: Secondary | ICD-10-CM | POA: Diagnosis not present

## 2020-08-23 ENCOUNTER — Ambulatory Visit: Payer: PPO | Admitting: Sports Medicine

## 2020-08-24 DIAGNOSIS — I10 Essential (primary) hypertension: Secondary | ICD-10-CM | POA: Diagnosis not present

## 2020-08-25 DIAGNOSIS — I5030 Unspecified diastolic (congestive) heart failure: Secondary | ICD-10-CM | POA: Diagnosis not present

## 2020-08-25 DIAGNOSIS — I340 Nonrheumatic mitral (valve) insufficiency: Secondary | ICD-10-CM | POA: Diagnosis not present

## 2020-08-25 DIAGNOSIS — I4821 Permanent atrial fibrillation: Secondary | ICD-10-CM | POA: Diagnosis not present

## 2020-08-25 DIAGNOSIS — Z7901 Long term (current) use of anticoagulants: Secondary | ICD-10-CM | POA: Diagnosis not present

## 2020-09-05 DIAGNOSIS — Z79899 Other long term (current) drug therapy: Secondary | ICD-10-CM | POA: Diagnosis not present

## 2020-09-05 DIAGNOSIS — Z6835 Body mass index (BMI) 35.0-35.9, adult: Secondary | ICD-10-CM | POA: Diagnosis not present

## 2020-09-05 DIAGNOSIS — E1121 Type 2 diabetes mellitus with diabetic nephropathy: Secondary | ICD-10-CM | POA: Diagnosis not present

## 2020-09-05 DIAGNOSIS — R5382 Chronic fatigue, unspecified: Secondary | ICD-10-CM | POA: Diagnosis not present

## 2020-09-05 DIAGNOSIS — G4733 Obstructive sleep apnea (adult) (pediatric): Secondary | ICD-10-CM | POA: Diagnosis not present

## 2020-09-05 DIAGNOSIS — E1129 Type 2 diabetes mellitus with other diabetic kidney complication: Secondary | ICD-10-CM | POA: Diagnosis not present

## 2020-09-05 DIAGNOSIS — I4891 Unspecified atrial fibrillation: Secondary | ICD-10-CM | POA: Diagnosis not present

## 2020-09-05 DIAGNOSIS — E7800 Pure hypercholesterolemia, unspecified: Secondary | ICD-10-CM | POA: Diagnosis not present

## 2020-09-05 DIAGNOSIS — N184 Chronic kidney disease, stage 4 (severe): Secondary | ICD-10-CM | POA: Diagnosis not present

## 2020-09-05 DIAGNOSIS — E669 Obesity, unspecified: Secondary | ICD-10-CM | POA: Diagnosis not present

## 2020-09-19 DIAGNOSIS — I4821 Permanent atrial fibrillation: Secondary | ICD-10-CM | POA: Diagnosis not present

## 2020-09-22 DIAGNOSIS — I4891 Unspecified atrial fibrillation: Secondary | ICD-10-CM | POA: Diagnosis not present

## 2020-09-26 DIAGNOSIS — I4821 Permanent atrial fibrillation: Secondary | ICD-10-CM | POA: Diagnosis not present

## 2020-09-26 DIAGNOSIS — R531 Weakness: Secondary | ICD-10-CM | POA: Diagnosis not present

## 2020-09-26 DIAGNOSIS — I340 Nonrheumatic mitral (valve) insufficiency: Secondary | ICD-10-CM | POA: Diagnosis not present

## 2020-09-26 DIAGNOSIS — I5031 Acute diastolic (congestive) heart failure: Secondary | ICD-10-CM | POA: Diagnosis not present

## 2020-09-26 DIAGNOSIS — R931 Abnormal findings on diagnostic imaging of heart and coronary circulation: Secondary | ICD-10-CM | POA: Diagnosis not present

## 2020-09-27 DIAGNOSIS — Z5321 Procedure and treatment not carried out due to patient leaving prior to being seen by health care provider: Secondary | ICD-10-CM | POA: Diagnosis not present

## 2020-09-27 DIAGNOSIS — S41151A Open bite of right upper arm, initial encounter: Secondary | ICD-10-CM | POA: Diagnosis not present

## 2020-10-03 DIAGNOSIS — N184 Chronic kidney disease, stage 4 (severe): Secondary | ICD-10-CM | POA: Diagnosis not present

## 2020-10-11 DIAGNOSIS — Z794 Long term (current) use of insulin: Secondary | ICD-10-CM | POA: Diagnosis not present

## 2020-10-11 DIAGNOSIS — Z6833 Body mass index (BMI) 33.0-33.9, adult: Secondary | ICD-10-CM | POA: Diagnosis not present

## 2020-10-11 DIAGNOSIS — Z7901 Long term (current) use of anticoagulants: Secondary | ICD-10-CM | POA: Diagnosis not present

## 2020-10-11 DIAGNOSIS — N1832 Chronic kidney disease, stage 3b: Secondary | ICD-10-CM | POA: Diagnosis not present

## 2020-10-11 DIAGNOSIS — I129 Hypertensive chronic kidney disease with stage 1 through stage 4 chronic kidney disease, or unspecified chronic kidney disease: Secondary | ICD-10-CM | POA: Diagnosis not present

## 2020-10-11 DIAGNOSIS — E1142 Type 2 diabetes mellitus with diabetic polyneuropathy: Secondary | ICD-10-CM | POA: Diagnosis not present

## 2020-10-11 DIAGNOSIS — I4891 Unspecified atrial fibrillation: Secondary | ICD-10-CM | POA: Diagnosis not present

## 2020-10-11 DIAGNOSIS — M2042 Other hammer toe(s) (acquired), left foot: Secondary | ICD-10-CM | POA: Diagnosis not present

## 2020-10-11 DIAGNOSIS — L84 Corns and callosities: Secondary | ICD-10-CM | POA: Diagnosis not present

## 2020-10-11 DIAGNOSIS — Z7984 Long term (current) use of oral hypoglycemic drugs: Secondary | ICD-10-CM | POA: Diagnosis not present

## 2020-10-11 DIAGNOSIS — E1122 Type 2 diabetes mellitus with diabetic chronic kidney disease: Secondary | ICD-10-CM | POA: Diagnosis not present

## 2020-10-11 DIAGNOSIS — E669 Obesity, unspecified: Secondary | ICD-10-CM | POA: Diagnosis not present

## 2020-10-11 DIAGNOSIS — M2041 Other hammer toe(s) (acquired), right foot: Secondary | ICD-10-CM | POA: Diagnosis not present

## 2020-10-13 DIAGNOSIS — E1122 Type 2 diabetes mellitus with diabetic chronic kidney disease: Secondary | ICD-10-CM | POA: Diagnosis not present

## 2020-10-13 DIAGNOSIS — I130 Hypertensive heart and chronic kidney disease with heart failure and stage 1 through stage 4 chronic kidney disease, or unspecified chronic kidney disease: Secondary | ICD-10-CM | POA: Diagnosis not present

## 2020-10-13 DIAGNOSIS — I10 Essential (primary) hypertension: Secondary | ICD-10-CM | POA: Diagnosis not present

## 2020-10-13 DIAGNOSIS — I5030 Unspecified diastolic (congestive) heart failure: Secondary | ICD-10-CM | POA: Diagnosis not present

## 2020-10-13 DIAGNOSIS — I4821 Permanent atrial fibrillation: Secondary | ICD-10-CM | POA: Diagnosis not present

## 2020-10-13 DIAGNOSIS — Z794 Long term (current) use of insulin: Secondary | ICD-10-CM | POA: Diagnosis not present

## 2020-10-13 DIAGNOSIS — E669 Obesity, unspecified: Secondary | ICD-10-CM | POA: Diagnosis not present

## 2020-10-13 DIAGNOSIS — I340 Nonrheumatic mitral (valve) insufficiency: Secondary | ICD-10-CM | POA: Diagnosis not present

## 2020-10-13 DIAGNOSIS — N189 Chronic kidney disease, unspecified: Secondary | ICD-10-CM | POA: Diagnosis not present

## 2020-10-13 DIAGNOSIS — Z7901 Long term (current) use of anticoagulants: Secondary | ICD-10-CM | POA: Diagnosis not present

## 2020-10-19 DIAGNOSIS — M2041 Other hammer toe(s) (acquired), right foot: Secondary | ICD-10-CM | POA: Diagnosis not present

## 2020-10-19 DIAGNOSIS — Z794 Long term (current) use of insulin: Secondary | ICD-10-CM | POA: Diagnosis not present

## 2020-10-19 DIAGNOSIS — M2042 Other hammer toe(s) (acquired), left foot: Secondary | ICD-10-CM | POA: Diagnosis not present

## 2020-10-19 DIAGNOSIS — E1142 Type 2 diabetes mellitus with diabetic polyneuropathy: Secondary | ICD-10-CM | POA: Diagnosis not present

## 2020-11-01 DIAGNOSIS — R21 Rash and other nonspecific skin eruption: Secondary | ICD-10-CM | POA: Diagnosis not present

## 2020-11-01 DIAGNOSIS — G2581 Restless legs syndrome: Secondary | ICD-10-CM | POA: Diagnosis not present

## 2020-11-09 DIAGNOSIS — Z961 Presence of intraocular lens: Secondary | ICD-10-CM | POA: Diagnosis not present

## 2020-11-09 DIAGNOSIS — H4323 Crystalline deposits in vitreous body, bilateral: Secondary | ICD-10-CM | POA: Diagnosis not present

## 2020-11-09 DIAGNOSIS — H16223 Keratoconjunctivitis sicca, not specified as Sjogren's, bilateral: Secondary | ICD-10-CM | POA: Diagnosis not present

## 2020-11-09 DIAGNOSIS — H43812 Vitreous degeneration, left eye: Secondary | ICD-10-CM | POA: Diagnosis not present

## 2020-11-09 DIAGNOSIS — E113293 Type 2 diabetes mellitus with mild nonproliferative diabetic retinopathy without macular edema, bilateral: Secondary | ICD-10-CM | POA: Diagnosis not present

## 2020-11-16 DIAGNOSIS — Z961 Presence of intraocular lens: Secondary | ICD-10-CM | POA: Diagnosis not present

## 2020-11-28 DIAGNOSIS — M8589 Other specified disorders of bone density and structure, multiple sites: Secondary | ICD-10-CM | POA: Diagnosis not present

## 2020-11-28 DIAGNOSIS — M85851 Other specified disorders of bone density and structure, right thigh: Secondary | ICD-10-CM | POA: Diagnosis not present

## 2020-11-28 DIAGNOSIS — Z1231 Encounter for screening mammogram for malignant neoplasm of breast: Secondary | ICD-10-CM | POA: Diagnosis not present

## 2020-12-27 DIAGNOSIS — J069 Acute upper respiratory infection, unspecified: Secondary | ICD-10-CM | POA: Diagnosis not present

## 2020-12-27 DIAGNOSIS — N3000 Acute cystitis without hematuria: Secondary | ICD-10-CM | POA: Diagnosis not present

## 2020-12-27 DIAGNOSIS — J0100 Acute maxillary sinusitis, unspecified: Secondary | ICD-10-CM | POA: Diagnosis not present

## 2020-12-30 DIAGNOSIS — R069 Unspecified abnormalities of breathing: Secondary | ICD-10-CM | POA: Diagnosis not present

## 2020-12-30 DIAGNOSIS — B373 Candidiasis of vulva and vagina: Secondary | ICD-10-CM | POA: Diagnosis not present

## 2020-12-30 DIAGNOSIS — N3000 Acute cystitis without hematuria: Secondary | ICD-10-CM | POA: Diagnosis not present

## 2020-12-30 DIAGNOSIS — R509 Fever, unspecified: Secondary | ICD-10-CM | POA: Diagnosis not present

## 2020-12-30 DIAGNOSIS — R531 Weakness: Secondary | ICD-10-CM | POA: Diagnosis not present

## 2020-12-30 DIAGNOSIS — U071 COVID-19: Secondary | ICD-10-CM | POA: Diagnosis not present

## 2020-12-30 DIAGNOSIS — R0602 Shortness of breath: Secondary | ICD-10-CM | POA: Diagnosis not present

## 2020-12-30 DIAGNOSIS — I4891 Unspecified atrial fibrillation: Secondary | ICD-10-CM | POA: Diagnosis not present

## 2020-12-30 DIAGNOSIS — R0902 Hypoxemia: Secondary | ICD-10-CM | POA: Diagnosis not present

## 2021-01-16 DIAGNOSIS — E1121 Type 2 diabetes mellitus with diabetic nephropathy: Secondary | ICD-10-CM | POA: Diagnosis not present

## 2021-01-16 DIAGNOSIS — Z6832 Body mass index (BMI) 32.0-32.9, adult: Secondary | ICD-10-CM | POA: Diagnosis not present

## 2021-01-16 DIAGNOSIS — K219 Gastro-esophageal reflux disease without esophagitis: Secondary | ICD-10-CM | POA: Diagnosis not present

## 2021-01-16 DIAGNOSIS — G2581 Restless legs syndrome: Secondary | ICD-10-CM | POA: Diagnosis not present

## 2021-01-16 DIAGNOSIS — E7800 Pure hypercholesterolemia, unspecified: Secondary | ICD-10-CM | POA: Diagnosis not present

## 2021-01-16 DIAGNOSIS — F324 Major depressive disorder, single episode, in partial remission: Secondary | ICD-10-CM | POA: Diagnosis not present

## 2021-01-16 DIAGNOSIS — I4891 Unspecified atrial fibrillation: Secondary | ICD-10-CM | POA: Diagnosis not present

## 2021-01-30 DIAGNOSIS — H353131 Nonexudative age-related macular degeneration, bilateral, early dry stage: Secondary | ICD-10-CM | POA: Diagnosis not present

## 2021-01-31 DIAGNOSIS — Z23 Encounter for immunization: Secondary | ICD-10-CM | POA: Diagnosis not present

## 2021-02-03 ENCOUNTER — Encounter (INDEPENDENT_AMBULATORY_CARE_PROVIDER_SITE_OTHER): Payer: PPO | Admitting: Ophthalmology

## 2021-02-09 DIAGNOSIS — N1832 Chronic kidney disease, stage 3b: Secondary | ICD-10-CM | POA: Diagnosis not present

## 2021-02-14 ENCOUNTER — Encounter (INDEPENDENT_AMBULATORY_CARE_PROVIDER_SITE_OTHER): Payer: PPO | Admitting: Ophthalmology

## 2021-02-14 ENCOUNTER — Other Ambulatory Visit: Payer: Self-pay

## 2021-02-14 DIAGNOSIS — H43813 Vitreous degeneration, bilateral: Secondary | ICD-10-CM | POA: Diagnosis not present

## 2021-02-14 DIAGNOSIS — Z6833 Body mass index (BMI) 33.0-33.9, adult: Secondary | ICD-10-CM | POA: Diagnosis not present

## 2021-02-14 DIAGNOSIS — F330 Major depressive disorder, recurrent, mild: Secondary | ICD-10-CM | POA: Diagnosis not present

## 2021-02-14 DIAGNOSIS — E669 Obesity, unspecified: Secondary | ICD-10-CM | POA: Diagnosis not present

## 2021-02-14 DIAGNOSIS — H353132 Nonexudative age-related macular degeneration, bilateral, intermediate dry stage: Secondary | ICD-10-CM | POA: Diagnosis not present

## 2021-02-14 DIAGNOSIS — E113391 Type 2 diabetes mellitus with moderate nonproliferative diabetic retinopathy without macular edema, right eye: Secondary | ICD-10-CM | POA: Diagnosis not present

## 2021-02-14 DIAGNOSIS — E7800 Pure hypercholesterolemia, unspecified: Secondary | ICD-10-CM | POA: Diagnosis not present

## 2021-02-14 DIAGNOSIS — E113592 Type 2 diabetes mellitus with proliferative diabetic retinopathy without macular edema, left eye: Secondary | ICD-10-CM | POA: Diagnosis not present

## 2021-02-14 DIAGNOSIS — E1121 Type 2 diabetes mellitus with diabetic nephropathy: Secondary | ICD-10-CM | POA: Diagnosis not present

## 2021-03-06 DIAGNOSIS — J069 Acute upper respiratory infection, unspecified: Secondary | ICD-10-CM | POA: Diagnosis not present

## 2021-03-16 DIAGNOSIS — D485 Neoplasm of uncertain behavior of skin: Secondary | ICD-10-CM | POA: Diagnosis not present

## 2021-04-06 DIAGNOSIS — E1121 Type 2 diabetes mellitus with diabetic nephropathy: Secondary | ICD-10-CM | POA: Diagnosis not present

## 2021-04-06 DIAGNOSIS — E1169 Type 2 diabetes mellitus with other specified complication: Secondary | ICD-10-CM | POA: Diagnosis not present

## 2021-04-10 DIAGNOSIS — C44519 Basal cell carcinoma of skin of other part of trunk: Secondary | ICD-10-CM | POA: Diagnosis not present

## 2021-04-10 DIAGNOSIS — R0602 Shortness of breath: Secondary | ICD-10-CM | POA: Diagnosis not present

## 2021-04-10 DIAGNOSIS — R0600 Dyspnea, unspecified: Secondary | ICD-10-CM | POA: Diagnosis not present

## 2021-04-10 DIAGNOSIS — R059 Cough, unspecified: Secondary | ICD-10-CM | POA: Diagnosis not present

## 2021-04-10 DIAGNOSIS — R052 Subacute cough: Secondary | ICD-10-CM | POA: Diagnosis not present

## 2021-04-11 DIAGNOSIS — J9811 Atelectasis: Secondary | ICD-10-CM | POA: Diagnosis not present

## 2021-04-11 DIAGNOSIS — J189 Pneumonia, unspecified organism: Secondary | ICD-10-CM | POA: Diagnosis not present

## 2021-04-11 DIAGNOSIS — R0600 Dyspnea, unspecified: Secondary | ICD-10-CM | POA: Diagnosis not present

## 2021-04-11 DIAGNOSIS — I700 Atherosclerosis of aorta: Secondary | ICD-10-CM | POA: Diagnosis not present

## 2021-04-11 DIAGNOSIS — J90 Pleural effusion, not elsewhere classified: Secondary | ICD-10-CM | POA: Diagnosis not present

## 2021-04-11 DIAGNOSIS — R0602 Shortness of breath: Secondary | ICD-10-CM | POA: Diagnosis not present

## 2021-04-12 DIAGNOSIS — J069 Acute upper respiratory infection, unspecified: Secondary | ICD-10-CM | POA: Diagnosis not present

## 2021-05-01 DIAGNOSIS — D4861 Neoplasm of uncertain behavior of right breast: Secondary | ICD-10-CM | POA: Diagnosis not present

## 2021-05-01 DIAGNOSIS — E1142 Type 2 diabetes mellitus with diabetic polyneuropathy: Secondary | ICD-10-CM | POA: Diagnosis not present

## 2021-05-01 DIAGNOSIS — G4733 Obstructive sleep apnea (adult) (pediatric): Secondary | ICD-10-CM | POA: Diagnosis not present

## 2021-05-01 DIAGNOSIS — F331 Major depressive disorder, recurrent, moderate: Secondary | ICD-10-CM | POA: Diagnosis not present

## 2021-05-01 DIAGNOSIS — N1832 Chronic kidney disease, stage 3b: Secondary | ICD-10-CM | POA: Diagnosis not present

## 2021-05-01 DIAGNOSIS — Z0000 Encounter for general adult medical examination without abnormal findings: Secondary | ICD-10-CM | POA: Diagnosis not present

## 2021-05-01 DIAGNOSIS — I2584 Coronary atherosclerosis due to calcified coronary lesion: Secondary | ICD-10-CM | POA: Diagnosis not present

## 2021-05-01 DIAGNOSIS — Z6834 Body mass index (BMI) 34.0-34.9, adult: Secondary | ICD-10-CM | POA: Diagnosis not present

## 2021-05-01 DIAGNOSIS — I700 Atherosclerosis of aorta: Secondary | ICD-10-CM | POA: Diagnosis not present

## 2021-05-01 DIAGNOSIS — E1121 Type 2 diabetes mellitus with diabetic nephropathy: Secondary | ICD-10-CM | POA: Diagnosis not present

## 2021-05-01 DIAGNOSIS — Z853 Personal history of malignant neoplasm of breast: Secondary | ICD-10-CM | POA: Diagnosis not present

## 2021-05-03 DIAGNOSIS — Z7901 Long term (current) use of anticoagulants: Secondary | ICD-10-CM | POA: Diagnosis not present

## 2021-05-03 DIAGNOSIS — N1832 Chronic kidney disease, stage 3b: Secondary | ICD-10-CM | POA: Diagnosis not present

## 2021-05-03 DIAGNOSIS — E1142 Type 2 diabetes mellitus with diabetic polyneuropathy: Secondary | ICD-10-CM | POA: Diagnosis not present

## 2021-05-07 ENCOUNTER — Inpatient Hospital Stay (HOSPITAL_COMMUNITY)
Admission: EM | Admit: 2021-05-07 | Discharge: 2021-05-12 | DRG: 522 | Disposition: A | Discharge status: Home Health | Payer: PPO | Attending: Family Medicine | Admitting: Family Medicine

## 2021-05-07 ENCOUNTER — Emergency Department (HOSPITAL_COMMUNITY): Payer: PPO

## 2021-05-07 DIAGNOSIS — Z66 Do not resuscitate: Secondary | ICD-10-CM | POA: Diagnosis present

## 2021-05-07 DIAGNOSIS — E1165 Type 2 diabetes mellitus with hyperglycemia: Secondary | ICD-10-CM | POA: Diagnosis not present

## 2021-05-07 DIAGNOSIS — G2581 Restless legs syndrome: Secondary | ICD-10-CM | POA: Diagnosis not present

## 2021-05-07 DIAGNOSIS — Y92511 Restaurant or cafe as the place of occurrence of the external cause: Secondary | ICD-10-CM

## 2021-05-07 DIAGNOSIS — Z96653 Presence of artificial knee joint, bilateral: Secondary | ICD-10-CM | POA: Diagnosis not present

## 2021-05-07 DIAGNOSIS — Y9301 Activity, walking, marching and hiking: Secondary | ICD-10-CM | POA: Diagnosis present

## 2021-05-07 DIAGNOSIS — Z96642 Presence of left artificial hip joint: Secondary | ICD-10-CM | POA: Diagnosis present

## 2021-05-07 DIAGNOSIS — Z853 Personal history of malignant neoplasm of breast: Secondary | ICD-10-CM

## 2021-05-07 DIAGNOSIS — Z9181 History of falling: Secondary | ICD-10-CM

## 2021-05-07 DIAGNOSIS — S72009A Fracture of unspecified part of neck of unspecified femur, initial encounter for closed fracture: Secondary | ICD-10-CM | POA: Diagnosis present

## 2021-05-07 DIAGNOSIS — R079 Chest pain, unspecified: Secondary | ICD-10-CM | POA: Diagnosis not present

## 2021-05-07 DIAGNOSIS — Z96659 Presence of unspecified artificial knee joint: Secondary | ICD-10-CM

## 2021-05-07 DIAGNOSIS — Z96649 Presence of unspecified artificial hip joint: Secondary | ICD-10-CM

## 2021-05-07 DIAGNOSIS — Z7984 Long term (current) use of oral hypoglycemic drugs: Secondary | ICD-10-CM

## 2021-05-07 DIAGNOSIS — E1122 Type 2 diabetes mellitus with diabetic chronic kidney disease: Secondary | ICD-10-CM | POA: Diagnosis not present

## 2021-05-07 DIAGNOSIS — M79641 Pain in right hand: Secondary | ICD-10-CM

## 2021-05-07 DIAGNOSIS — M19041 Primary osteoarthritis, right hand: Secondary | ICD-10-CM | POA: Diagnosis not present

## 2021-05-07 DIAGNOSIS — Z6831 Body mass index (BMI) 31.0-31.9, adult: Secondary | ICD-10-CM

## 2021-05-07 DIAGNOSIS — Z833 Family history of diabetes mellitus: Secondary | ICD-10-CM | POA: Diagnosis not present

## 2021-05-07 DIAGNOSIS — Z471 Aftercare following joint replacement surgery: Secondary | ICD-10-CM | POA: Diagnosis not present

## 2021-05-07 DIAGNOSIS — Z90710 Acquired absence of both cervix and uterus: Secondary | ICD-10-CM

## 2021-05-07 DIAGNOSIS — M25551 Pain in right hip: Secondary | ICD-10-CM

## 2021-05-07 DIAGNOSIS — N179 Acute kidney failure, unspecified: Secondary | ICD-10-CM | POA: Diagnosis present

## 2021-05-07 DIAGNOSIS — G43109 Migraine with aura, not intractable, without status migrainosus: Secondary | ICD-10-CM | POA: Diagnosis not present

## 2021-05-07 DIAGNOSIS — C50919 Malignant neoplasm of unspecified site of unspecified female breast: Secondary | ICD-10-CM | POA: Diagnosis not present

## 2021-05-07 DIAGNOSIS — E785 Hyperlipidemia, unspecified: Secondary | ICD-10-CM | POA: Diagnosis not present

## 2021-05-07 DIAGNOSIS — R748 Abnormal levels of other serum enzymes: Secondary | ICD-10-CM

## 2021-05-07 DIAGNOSIS — M80851A Other osteoporosis with current pathological fracture, right femur, initial encounter for fracture: Secondary | ICD-10-CM | POA: Diagnosis not present

## 2021-05-07 DIAGNOSIS — Z20822 Contact with and (suspected) exposure to covid-19: Secondary | ICD-10-CM | POA: Diagnosis not present

## 2021-05-07 DIAGNOSIS — Z9989 Dependence on other enabling machines and devices: Secondary | ICD-10-CM | POA: Diagnosis not present

## 2021-05-07 DIAGNOSIS — R519 Headache, unspecified: Secondary | ICD-10-CM | POA: Diagnosis not present

## 2021-05-07 DIAGNOSIS — G4733 Obstructive sleep apnea (adult) (pediatric): Secondary | ICD-10-CM | POA: Diagnosis not present

## 2021-05-07 DIAGNOSIS — I4891 Unspecified atrial fibrillation: Secondary | ICD-10-CM | POA: Diagnosis present

## 2021-05-07 DIAGNOSIS — E1151 Type 2 diabetes mellitus with diabetic peripheral angiopathy without gangrene: Secondary | ICD-10-CM

## 2021-05-07 DIAGNOSIS — N1832 Chronic kidney disease, stage 3b: Secondary | ICD-10-CM | POA: Diagnosis present

## 2021-05-07 DIAGNOSIS — I493 Ventricular premature depolarization: Secondary | ICD-10-CM | POA: Diagnosis present

## 2021-05-07 DIAGNOSIS — Z794 Long term (current) use of insulin: Secondary | ICD-10-CM

## 2021-05-07 DIAGNOSIS — W19XXXA Unspecified fall, initial encounter: Secondary | ICD-10-CM

## 2021-05-07 DIAGNOSIS — Z7901 Long term (current) use of anticoagulants: Secondary | ICD-10-CM

## 2021-05-07 DIAGNOSIS — Z882 Allergy status to sulfonamides status: Secondary | ICD-10-CM

## 2021-05-07 DIAGNOSIS — D696 Thrombocytopenia, unspecified: Secondary | ICD-10-CM | POA: Diagnosis not present

## 2021-05-07 DIAGNOSIS — E119 Type 2 diabetes mellitus without complications: Secondary | ICD-10-CM | POA: Diagnosis not present

## 2021-05-07 DIAGNOSIS — I4811 Longstanding persistent atrial fibrillation: Secondary | ICD-10-CM | POA: Diagnosis not present

## 2021-05-07 DIAGNOSIS — K219 Gastro-esophageal reflux disease without esophagitis: Secondary | ICD-10-CM | POA: Diagnosis present

## 2021-05-07 DIAGNOSIS — E875 Hyperkalemia: Secondary | ICD-10-CM | POA: Diagnosis not present

## 2021-05-07 DIAGNOSIS — E113593 Type 2 diabetes mellitus with proliferative diabetic retinopathy without macular edema, bilateral: Secondary | ICD-10-CM | POA: Diagnosis present

## 2021-05-07 DIAGNOSIS — W010XXA Fall on same level from slipping, tripping and stumbling without subsequent striking against object, initial encounter: Secondary | ICD-10-CM | POA: Diagnosis present

## 2021-05-07 DIAGNOSIS — R52 Pain, unspecified: Secondary | ICD-10-CM

## 2021-05-07 DIAGNOSIS — R7401 Elevation of levels of liver transaminase levels: Secondary | ICD-10-CM | POA: Diagnosis present

## 2021-05-07 DIAGNOSIS — S72011A Unspecified intracapsular fracture of right femur, initial encounter for closed fracture: Secondary | ICD-10-CM | POA: Diagnosis not present

## 2021-05-07 DIAGNOSIS — E1142 Type 2 diabetes mellitus with diabetic polyneuropathy: Secondary | ICD-10-CM | POA: Diagnosis present

## 2021-05-07 DIAGNOSIS — S72001A Fracture of unspecified part of neck of right femur, initial encounter for closed fracture: Secondary | ICD-10-CM | POA: Diagnosis not present

## 2021-05-07 DIAGNOSIS — Z79899 Other long term (current) drug therapy: Secondary | ICD-10-CM

## 2021-05-07 DIAGNOSIS — Z96641 Presence of right artificial hip joint: Secondary | ICD-10-CM | POA: Diagnosis not present

## 2021-05-07 DIAGNOSIS — R945 Abnormal results of liver function studies: Secondary | ICD-10-CM | POA: Diagnosis not present

## 2021-05-07 DIAGNOSIS — M25569 Pain in unspecified knee: Secondary | ICD-10-CM

## 2021-05-07 DIAGNOSIS — G4700 Insomnia, unspecified: Secondary | ICD-10-CM | POA: Diagnosis present

## 2021-05-07 DIAGNOSIS — Z419 Encounter for procedure for purposes other than remedying health state, unspecified: Secondary | ICD-10-CM

## 2021-05-07 DIAGNOSIS — M79604 Pain in right leg: Secondary | ICD-10-CM | POA: Diagnosis not present

## 2021-05-07 DIAGNOSIS — R102 Pelvic and perineal pain: Secondary | ICD-10-CM | POA: Diagnosis not present

## 2021-05-07 LAB — RESP PANEL BY RT-PCR (FLU A&B, COVID) ARPGX2
Influenza A by PCR: NEGATIVE
Influenza B by PCR: NEGATIVE
SARS Coronavirus 2 by RT PCR: NEGATIVE

## 2021-05-07 LAB — BASIC METABOLIC PANEL
Anion gap: 14 (ref 5–15)
BUN: 40 mg/dL — ABNORMAL HIGH (ref 8–23)
CO2: 22 mmol/L (ref 22–32)
Calcium: 8.8 mg/dL — ABNORMAL LOW (ref 8.9–10.3)
Chloride: 102 mmol/L (ref 98–111)
Creatinine, Ser: 1.93 mg/dL — ABNORMAL HIGH (ref 0.44–1.00)
GFR, Estimated: 27 mL/min — ABNORMAL LOW (ref >60)
Glucose, Bld: 237 mg/dL — ABNORMAL HIGH (ref 70–99)
Potassium: 4.3 mmol/L (ref 3.5–5.1)
Sodium: 138 mmol/L (ref 135–145)

## 2021-05-07 LAB — CBC WITH DIFFERENTIAL/PLATELET
Abs Immature Granulocytes: 0.05 10*3/uL (ref 0.00–0.07)
Basophils Absolute: 0 10*3/uL (ref 0.0–0.1)
Basophils Relative: 0 %
Eosinophils Absolute: 0.1 10*3/uL (ref 0.0–0.5)
Eosinophils Relative: 1 %
HCT: 46.6 % — ABNORMAL HIGH (ref 36.0–46.0)
Hemoglobin: 15.4 g/dL — ABNORMAL HIGH (ref 12.0–15.0)
Immature Granulocytes: 1 %
Lymphocytes Relative: 19 %
Lymphs Abs: 1.5 10*3/uL (ref 0.7–4.0)
MCH: 31.2 pg (ref 26.0–34.0)
MCHC: 33 g/dL (ref 30.0–36.0)
MCV: 94.5 fL (ref 80.0–100.0)
Monocytes Absolute: 0.5 10*3/uL (ref 0.1–1.0)
Monocytes Relative: 6 %
Neutro Abs: 5.8 10*3/uL (ref 1.7–7.7)
Neutrophils Relative %: 73 %
Platelets: 178 10*3/uL (ref 150–400)
RBC: 4.93 MIL/uL (ref 3.87–5.11)
RDW: 12 % (ref 11.5–15.5)
WBC: 8 10*3/uL (ref 4.0–10.5)
nRBC: 0 % (ref 0.0–0.2)

## 2021-05-07 LAB — GLUCOSE, CAPILLARY: Glucose-Capillary: 232 mg/dL — ABNORMAL HIGH (ref 70–99)

## 2021-05-07 MED ORDER — ROPINIROLE HCL ER 4 MG PO TB24
4.0000 mg | ORAL_TABLET | Freq: Every day | ORAL | Status: AC
  Administered 2021-05-08: 4 mg via ORAL
  Filled 2021-05-07: qty 1

## 2021-05-07 MED ORDER — ONDANSETRON HCL 4 MG/2ML IJ SOLN: 4.0000 mg | Freq: Four times a day (QID) | INTRAMUSCULAR | Status: DC | PRN

## 2021-05-07 MED ORDER — FENTANYL CITRATE (PF) 100 MCG/2ML IJ SOLN
50.0000 ug | Freq: Once | INTRAMUSCULAR | Status: AC
  Administered 2021-05-07: 50 ug via INTRAVENOUS
  Filled 2021-05-07: qty 2

## 2021-05-07 MED ORDER — DULOXETINE HCL 60 MG PO CPEP
60.0000 mg | ORAL_CAPSULE | Freq: Every morning | ORAL | Status: DC
  Administered 2021-05-08 – 2021-05-12 (×5): 60 mg via ORAL
  Filled 2021-05-07 (×5): qty 1

## 2021-05-07 MED ORDER — LACTATED RINGERS IV SOLN: INTRAVENOUS | Status: DC

## 2021-05-07 MED ORDER — METOPROLOL TARTRATE 25 MG PO TABS
25.0000 mg | ORAL_TABLET | Freq: Two times a day (BID) | ORAL | Status: DC
  Administered 2021-05-07 – 2021-05-10 (×6): 25 mg via ORAL
  Filled 2021-05-07 (×2): qty 1
  Filled 2021-05-07: qty 2
  Filled 2021-05-07 (×4): qty 1

## 2021-05-07 MED ORDER — MORPHINE SULFATE (PF) 4 MG/ML IV SOLN
4.0000 mg | Freq: Once | INTRAVENOUS | Status: AC
  Administered 2021-05-07: 4 mg via INTRAVENOUS
  Filled 2021-05-07: qty 1

## 2021-05-07 MED ORDER — ACETAMINOPHEN 325 MG PO TABS: 650.0000 mg | ORAL_TABLET | Freq: Four times a day (QID) | ORAL | Status: DC

## 2021-05-07 MED ORDER — ROPINIROLE HCL ER 6 MG PO TB24: 6.0000 mg | ORAL_TABLET | Freq: Every morning | ORAL | Status: DC

## 2021-05-07 MED ORDER — POLYETHYLENE GLYCOL 3350 17 G PO PACK
17.0000 g | PACK | Freq: Two times a day (BID) | ORAL | Status: DC
  Administered 2021-05-07 – 2021-05-08 (×2): 17 g via ORAL
  Filled 2021-05-07 (×4): qty 1

## 2021-05-07 MED ORDER — HEPARIN SODIUM (PORCINE) 5000 UNIT/ML IJ SOLN
5000.0000 [IU] | Freq: Three times a day (TID) | INTRAMUSCULAR | Status: DC
  Administered 2021-05-07 – 2021-05-08 (×2): 5000 [IU] via SUBCUTANEOUS
  Filled 2021-05-07 (×2): qty 1

## 2021-05-07 MED ORDER — LACTATED RINGERS IV BOLUS
1000.0000 mL | Freq: Once | INTRAVENOUS | Status: AC
  Administered 2021-05-07: 1000 mL via INTRAVENOUS

## 2021-05-07 MED ORDER — INSULIN ASPART 100 UNIT/ML IJ SOLN
0.0000 [IU] | Freq: Three times a day (TID) | INTRAMUSCULAR | Status: DC
  Administered 2021-05-08: 2 [IU] via SUBCUTANEOUS
  Administered 2021-05-08: 1 [IU] via SUBCUTANEOUS
  Administered 2021-05-10: 5 [IU] via SUBCUTANEOUS
  Administered 2021-05-10: 7 [IU] via SUBCUTANEOUS
  Administered 2021-05-10: 2 [IU] via SUBCUTANEOUS
  Administered 2021-05-11 (×2): 9 [IU] via SUBCUTANEOUS
  Administered 2021-05-11: 7 [IU] via SUBCUTANEOUS
  Administered 2021-05-12: 2 [IU] via SUBCUTANEOUS
  Administered 2021-05-12: 1 [IU] via SUBCUTANEOUS

## 2021-05-07 MED ORDER — ONDANSETRON HCL 4 MG PO TABS: 4.0000 mg | ORAL_TABLET | Freq: Four times a day (QID) | ORAL | Status: DC | PRN

## 2021-05-07 MED ORDER — HYDROMORPHONE HCL 1 MG/ML IJ SOLN
0.5000 mg | INTRAMUSCULAR | Status: DC | PRN
  Administered 2021-05-07 – 2021-05-09 (×6): 1 mg via INTRAVENOUS
  Filled 2021-05-07 (×7): qty 1

## 2021-05-07 MED ORDER — MORPHINE SULFATE (PF) 4 MG/ML IV SOLN
4.0000 mg | Freq: Once | INTRAVENOUS | Status: AC
Start: 2021-05-07 — End: 2021-05-07
  Administered 2021-05-07: 4 mg via INTRAVENOUS
  Filled 2021-05-07: qty 1

## 2021-05-07 MED ORDER — SENNA 8.6 MG PO TABS: 1.0000 | ORAL_TABLET | Freq: Every day | ORAL | Status: DC | PRN

## 2021-05-07 MED ORDER — ACETAMINOPHEN 325 MG PO TABS
650.0000 mg | ORAL_TABLET | Freq: Four times a day (QID) | ORAL | Status: DC
  Filled 2021-05-07: qty 2

## 2021-05-07 MED ORDER — HYDROMORPHONE HCL 1 MG/ML IJ SOLN
0.5000 mg | INTRAMUSCULAR | Status: DC | PRN
  Administered 2021-05-07: 0.5 mg via INTRAVENOUS
  Filled 2021-05-07: qty 1

## 2021-05-07 NOTE — H&P (Addendum)
[facility] Admission History and Physical Service Pager: [phone]  Patient name: Heather [name] Medical record number: [id number] Date of birth: Apr 13, 1946 Age: 75 y.o. Gender: female  Primary Care Provider: Greig Right, MD Consultants:  Ortho Code Status: DNR Preferred Emergency Contact: Husband  Chief Complaint: Fall  Assessment and Plan: Heather [name] is a 75 y.o. female presenting with . PMH is significant for bilateral knee replacements (2006 & 2007) and left hip replacement (2009), A-fib on anticoagulation, CKD stage 3b, T2DM, HLD and RLS.  Nondisplaced, non comminuted subcapital fracture of the right femoral neck Came in with right hip pain and right hand pain after fall.  Hemodynamically stable.  No concerning signs of neuorvascular compromise on physical exam.  Has history of falls with fractures but has not had one in several years.  Has bilateral knee replacements (2006 & 2007) and left hip replacement (2009).  Indicates has history of osteopoosis ad had bone density test but does not recall results and does not take calcium supplementation or have any other intervention.  Vit D and PTH normal levels in 3/22.  Right hand X-Ray shows no fracture or dislocation.  Nondisplaced, non comminuted subcapital fracture of the right femoral neck. No dislocation.  Type and screen obtained.  Seen by Orthopedic Surgery and indicated would plan for procedure 48 hours after last dose of Eliquis.  Will admit for pain control and planned surgical intervention. - Admit to inpatient, Attending Dr Owens Shark - Ortho following, appreciate recs - Vital signs q4hr - Scheduled Tylenol 650 mg q6hr - Dilaudid 0.69m q4hr PRN given CKD, can increase dose if needed - Incentive spirometer - Carb modified diet - NPO midnight before procedure  Afib on Anticoagulation On Eliquis 5 mg BID.  Last dose this morning  Will hold Eliquis for procedure.  Rhthym is normal rate and irregular.   EKG pending.  Takes Metoprolol at home but unsure of dose and type. - Hold Eliquis - follow up EKG - Start Metoprolol Tartrate 25 mg BID  AKI on CKD stage 3B Last Creatinine 1.6 in March.  Patient also has slight hemoconcentration at 15.4.  Got bolus dose of IV fluids and started on mIVF. - mIVF for 12 hours - AM CBC, BMP  Type 2 Diabetes Lst A1C was 7 per patient.  Monitors frequently at home with Dexcom device.  Glucose mildly elevated at 237.  Takes 50 U Lantus and Farxiga 10 mg every morning.   - Obtain A1C - sSSI - CBG's with meals and nightly - Holding home lantus and Farxiga  Hyperlipidemia Last LDL in 2018- 92.  On Crestor 40 mg at home. - Obtain Lipid panel  RLS Patient takes Requip 6 mg every morning. - Continue home requip   FEN/GI: Carb modified diet Prophylaxis: Heparin  Disposition: Med/Tele  History of Present Illness:  SKenise Barracois a 75y.o. female presenting with right hip and hand pain after fall.  [place].  Indicates caught self with right outstretched hand to break fall and landed on this and hip.  Denies hitting head.  Endorses pain in both but worse in hand.  Indicates has history of A-fib and last took Eliquis this morning.    Has history of falls with fractures but has not had one in several years.  Has bilateral knee replacements and left hip replacement.  Indicates has history of osteoporosis and had bone density test but  does not recall results and does not take calcium supplementation or have any other intervention.  Review Of Systems: Per HPI with the following additions:  Review of Systems  Respiratory: Negative for shortness of breath.   Cardiovascular: Negative for chest pain.  Neurological: Negative for dizziness and light-headedness.     Patient Active Problem List   Diagnosis Date Noted  . Diabetic peripheral angiopathy ([place]) 05/07/2021  . Breast cancer in  situ 04/17/2016  . Chronic kidney disease 04/17/2016  . Acute onset aura migraine 04/17/2016  . Fasciculation 04/17/2016  . Obstructive apnea 04/17/2016  . Restless leg 04/17/2016  . Long term current use of anticoagulant 05/30/2015  . Other long term (current) drug therapy 05/30/2015  . Atrial fibrillation ([place]) 08/14/2014  . Type II or unspecified type diabetes mellitus with peripheral circulatory disorders, uncontrolled(250.72) 05/15/2013  . Pains, foot 05/15/2013  . Diabetic peripheral neuropathy ([place]) 05/15/2013  . Morbid obesity ([place]) 05/15/2013  . Bilateral leg edema 05/15/2013  . Edema of foot 05/15/2013  . Diabetes mellitus ([place]) 05/15/2013  . Foot pain 05/15/2013  . Osteoarthritis   . Proliferative diabetic retinopathy of both eyes ([place])   . Bell's palsy     Past Medical History: Past Medical History:  Diagnosis Date  . Atrial fibrillation ([place]) 08/14/2014  . Bell's palsy   . Bimalleolar fracture of left ankle   . Breast cancer ([place])   . Diabetes mellitus, type II ([place])   . Dysrhythmia    a-fib  . GERD (gastroesophageal reflux disease)   . Low back pain with sciatica   . Osteoarthritis   . Proliferative diabetic retinopathy of both eyes ([place])   . Restless legs 2012  . Sleep apnea    CPAP nightly    Past Surgical History: Past Surgical History:  Procedure Laterality Date  . both knees  2006  . both legs Bilateral 2006 and 2007  . cataractectomies    . JOINT REPLACEMENT     bil TKR and lt hip  . laser eye surgeries    . left hip replacement     . left knee replacement    . ORIF ANKLE FRACTURE Left 01/19/2016   Procedure: OPEN REDUCTION INTERNAL FIXATION (ORIF) LEFT ANKLE BIMALLEOLAR  FRACTURE;  Surgeon: Wylene Simmer, MD;  Location: [facility];  Service: Orthopedics;  Laterality: Left;  . pin removal left foot    . r breast due to cancer  2011  . repair of left foot fracture    . right foot surgery     . right knee replacement     . TOTAL  HIP ARTHROPLASTY  2009  . VAGINAL HYSTERECTOMY      Social History: Social History   Tobacco Use  . Smoking status: Never Smoker  . Smokeless tobacco: Never Used  Substance Use Topics  . Alcohol use: No  . Drug use: No   Additional social history:  Please also refer to relevant sections of EMR.  Family History: Family History  Problem Relation Age of Onset  . Diabetes Father   . Heart disease Father   . Diabetes Paternal Aunt        3 aunts had DM  . Thyroid disease Maternal Grandmother   . Diabetes Maternal Grandfather     Allergies and Medications: Allergies  Allergen Reactions  . Sulfa Antibiotics Itching  . Sulfasalazine Itching   No current facility-administered medications on file prior to encounter.   Current Outpatient Medications on File Prior to  Encounter  Medication Sig Dispense Refill  . acetaminophen (TYLENOL) 500 MG tablet Take 500 mg by mouth every 6 (six) hours as needed.    Marland Kitchen apixaban (ELIQUIS) 5 MG TABS tablet Take 1 tablet (5 mg total) by mouth 2 (two) times daily. 60 tablet 5  . Cholecalciferol 2000 UNITS TABS Take 2,000 Units by mouth daily.     . DULoxetine (CYMBALTA) 30 MG capsule Take 30 mg by mouth daily.    Marland Kitchen glimepiride (AMARYL) 2 MG tablet Take 2 mg by mouth 2 (two) times a day.    . insulin detemir (LEVEMIR) 100 UNIT/ML injection Inject 35-40 Units into the skin daily. If CBGs > 200, will take 40 units If CBGs 135-199, will take 35 units If CBGs < 135, does not take insulin    . metoprolol (LOPRESSOR) 50 MG tablet Take 25 mg by mouth 2 (two) times daily.     . Multiple Vitamins-Minerals (WOMENS MULTIVITAMIN PLUS PO) Take 1 tablet by mouth daily.     . ropinirole (REQUIP) 5 MG tablet Take 2.5 mg by mouth 2 (two) times a day.     . rosuvastatin (CRESTOR) 40 MG tablet Take 40 mg by mouth daily.      Objective: BP (!) 156/87 (BP Location: Right Arm)   Pulse 78   Temp 98.6 F (37 C) (Oral)   Resp (!) 22   SpO2 98%  Exam:  Physical  Exam Constitutional:      General: She is not in acute distress.    Appearance: Normal appearance. She is not ill-appearing.  HENT:     Head: Normocephalic and atraumatic.     Mouth/Throat:     Mouth: Mucous membranes are moist.  Cardiovascular:     Rate and Rhythm: Normal rate. Rhythm irregular.     Pulses: Normal pulses.  Pulmonary:     Effort: Pulmonary effort is normal.     Breath sounds: Normal breath sounds.  Musculoskeletal:        General: Deformity present.     Right hip: No deformity or lacerations.     Left hip: No deformity or lacerations.     Right lower leg: No edema.     Left lower leg: No edema.     Comments: No sign of bruising or obvious deformity  Skin:    General: Skin is warm.  Neurological:     Mental Status: She is alert.     Motor: Motor function is intact.     Comments: Able to wiggle toes bilaterally without issue     Labs and Imaging: CBC BMET  Recent Labs  Lab 05/07/21 1517  WBC 8.0  HGB 15.4*  HCT 46.6*  PLT 178   Recent Labs  Lab 05/07/21 1517  NA 138  K 4.3  CL 102  CO2 22  BUN 40*  CREATININE 1.93*  GLUCOSE 237*  CALCIUM 8.8*     EKG: EKG pending  Delora Fuel, MD 05/07/2021, 7:28 PM PGY-1, [place] [facility] pager: [phone], text pages welcome

## 2021-05-07 NOTE — ED Notes (Addendum)
Providers at pt bedside to discuss plan of care

## 2021-05-07 NOTE — ED Triage Notes (Signed)
Pt arrives via EMS from [place]. Pt fell walking out. No LOC, denies back pain. Pt on thinners. Endorses left hip.   136/72 HR 67 98%

## 2021-05-07 NOTE — ED Notes (Signed)
Pt refused tylenol

## 2021-05-07 NOTE — ED Provider Notes (Signed)
[facility] EMERGENCY DEPARTMENT Provider Note   CSN: [id number] Arrival date & time: 05/07/21  1347     History No chief complaint on file.   Heather [name] is a 75 y.o. female.  The history is provided by the patient and medical records. No language interpreter was used.  Fall This is a new problem. The problem occurs rarely. The problem has not changed since onset.Pertinent negatives include no chest pain, no abdominal pain, no headaches and no shortness of breath. Nothing relieves the symptoms. She has tried nothing for the symptoms. The treatment provided no relief.       Past Medical History:  Diagnosis Date  . Atrial fibrillation ([place]) 08/14/2014  . Bell's palsy   . Bimalleolar fracture of left ankle   . Breast cancer ([place])   . Diabetes mellitus, type II ([place])   . Dysrhythmia    a-fib  . GERD (gastroesophageal reflux disease)   . Low back pain with sciatica   . Osteoarthritis   . Proliferative diabetic retinopathy of both eyes ([place])   . Restless legs 2012  . Sleep apnea    CPAP nightly    Patient Active Problem List   Diagnosis Date Noted  . Breast cancer in situ 04/17/2016  . Chronic kidney disease 04/17/2016  . Acute onset aura migraine 04/17/2016  . Fasciculation 04/17/2016  . Obstructive apnea 04/17/2016  . Restless leg 04/17/2016  . Long term current use of anticoagulant 05/30/2015  . Other long term (current) drug therapy 05/30/2015  . Atrial fibrillation ([place]) 08/14/2014  . Type II or unspecified type diabetes mellitus with peripheral circulatory disorders, uncontrolled(250.72) 05/15/2013  . Pains, foot 05/15/2013  . Diabetic peripheral neuropathy ([place]) 05/15/2013  . Morbid obesity ([place]) 05/15/2013  . Bilateral leg edema 05/15/2013  . Edema of foot 05/15/2013  . Diabetes mellitus ([place]) 05/15/2013  . Foot pain 05/15/2013  . Osteoarthritis   . Proliferative diabetic retinopathy of both eyes ([place])   . Bell's palsy     Past  Surgical History:  Procedure Laterality Date  . both knees  2006  . both legs Bilateral 2006 and 2007  . cataractectomies    . JOINT REPLACEMENT     bil TKR and lt hip  . laser eye surgeries    . left hip replacement     . left knee replacement    . ORIF ANKLE FRACTURE Left 01/19/2016   Procedure: OPEN REDUCTION INTERNAL FIXATION (ORIF) LEFT ANKLE BIMALLEOLAR  FRACTURE;  Surgeon: Wylene Simmer, MD;  Location: [facility];  Service: Orthopedics;  Laterality: Left;  . pin removal left foot    . r breast due to cancer  2011  . repair of left foot fracture    . right foot surgery     . right knee replacement     . TOTAL HIP ARTHROPLASTY  2009  . VAGINAL HYSTERECTOMY       OB History   No obstetric history on file.     Family History  Problem Relation Age of Onset  . Diabetes Father   . Heart disease Father   . Diabetes Paternal Aunt        3 aunts had DM  . Thyroid disease Maternal Grandmother   . Diabetes Maternal Grandfather     Social History   Tobacco Use  . Smoking status: Never Smoker  . Smokeless tobacco: Never Used  Substance Use Topics  . Alcohol use: No  . Drug use: No  Home Medications Prior to Admission medications   Medication Sig Start Date End Date Taking? Authorizing Provider  acetaminophen (TYLENOL) 500 MG tablet Take 500 mg by mouth every 6 (six) hours as needed.    [provider]  apixaban (ELIQUIS) 5 MG TABS tablet Take 1 tablet (5 mg total) by mouth 2 (two) times daily. 08/15/14   Brett Canales, PA-C  Cholecalciferol 2000 UNITS TABS Take 2,000 Units by mouth daily.     [provider]  DULoxetine (CYMBALTA) 30 MG capsule Take 30 mg by mouth daily.    [provider]  glimepiride (AMARYL) 2 MG tablet Take 2 mg by mouth 2 (two) times a day.    [provider]  insulin detemir (LEVEMIR) 100 UNIT/ML injection Inject 35-40 Units into the skin daily. If CBGs > 200, will take 40 units If CBGs 135-199,  will take 35 units If CBGs < 135, does not take insulin    [provider]  metoprolol (LOPRESSOR) 50 MG tablet Take 25 mg by mouth 2 (two) times daily.     [provider]  Multiple Vitamins-Minerals (WOMENS MULTIVITAMIN PLUS PO) Take 1 tablet by mouth daily.     [provider]  ropinirole (REQUIP) 5 MG tablet Take 2.5 mg by mouth 2 (two) times a day.     [provider]  rosuvastatin (CRESTOR) 40 MG tablet Take 40 mg by mouth daily.    [provider]    Allergies    Sulfa antibiotics and Sulfasalazine  Review of Systems   Review of Systems  Constitutional: Negative for chills, diaphoresis, fatigue and fever.  HENT: Negative for congestion.   Eyes: Negative for visual disturbance.  Respiratory: Negative for cough, chest tightness, shortness of breath and wheezing.   Cardiovascular: Negative for chest pain.  Gastrointestinal: Negative for abdominal pain, constipation, diarrhea and nausea.  Genitourinary: Negative for dysuria and flank pain.  Musculoskeletal: Negative for back pain.  Neurological: Negative for dizziness, weakness, light-headedness and headaches.  Psychiatric/Behavioral: Negative for agitation and confusion.  All other systems reviewed and are negative.   Physical Exam Updated Vital Signs BP (!) 145/70 (BP Location: Right Arm)   Pulse 71   Temp 98.6 F (37 C) (Oral)   Resp 20   SpO2 100%   Physical Exam Vitals and nursing note reviewed.  Constitutional:      General: She is not in acute distress.    Appearance: She is well-developed. She is not ill-appearing, toxic-appearing or diaphoretic.  HENT:     Head: Normocephalic and atraumatic.  Eyes:     Conjunctiva/sclera: Conjunctivae normal.  Cardiovascular:     Rate and Rhythm: Normal rate and regular rhythm.     Heart sounds: No murmur heard.   Pulmonary:     Effort: Pulmonary effort is normal. No respiratory distress.     Breath sounds: Normal breath  sounds.  Abdominal:     Palpations: Abdomen is soft.     Tenderness: There is no abdominal tenderness.  Musculoskeletal:        General: Tenderness and signs of injury present.     Right hand: Tenderness present. No lacerations. Normal strength. Normal sensation. Normal capillary refill. Normal pulse.       Arms:       Hands:     Cervical back: Neck supple.     Right hip: Tenderness and bony tenderness present.       Legs:     Comments: Tenderness in the  right hip and right femoral area.  Pelvis felt stable on exam.  Tenderness in the right thenar eminence but not the snuffbox.  Skin:    General: Skin is warm and dry.     Findings: No erythema.  Neurological:     General: No focal deficit present.     Mental Status: She is alert.  Psychiatric:        Mood and Affect: Mood normal.     ED Results / Procedures / Treatments   Labs (all labs ordered are listed, but only abnormal results are displayed) Labs Reviewed  RESP PANEL BY RT-PCR (FLU A&B, COVID) ARPGX2  CBC WITH DIFFERENTIAL/PLATELET  BASIC METABOLIC PANEL    EKG None  Radiology No results found.  Procedures Procedures   Medications Ordered in ED Medications  fentaNYL (SUBLIMAZE) injection 50 mcg (50 mcg Intravenous Given 05/07/21 1506)    ED Course  I have reviewed the triage vital signs and the nursing notes.  Pertinent labs & imaging results that were available during my care of the patient were reviewed by me and considered in my medical decision making (see chart for details).    MDM Rules/Calculators/A&P                          Myia Bergh is a 75 y.o. female with a past medical history significant for atrial fibrillation on Eliquis therapy, GERD, diabetes, previous breast cancer, and previous to the left hip replacement, left knee replacement, right knee replacement, and bilateral foot surgeries who presents with fall and right hand pain and right hip pain.  Patient reports that she was leaving a  restaurant and had just enjoyed a pepperoni pizza when she had a mechanical fall tripping on a bump of concrete and fell to the ground.  She reported landing on her right hip and tried to break it with her right hand.  She is reporting some pain in her right thumb pad but not in the anatomic snuffbox.  She is denying numbness, tingling, or weakness of the hand.  She is also primarily complaining of pain in her right proximal thigh and hip area.  She reports that it was extremely severe at times and has been unable to bear weight.  EMS was concerned that it was deformed.  She denies numbness or tingling or weakness distal to it in the foot. She denies other back pain, headache, neck pain, or torso pains.  On exam, lungs clear and chest nontender.  Abdomen nontender.  Patient has tenderness in the right hip and the right proximal thigh.  No knee tenderness or ankle tenderness.  She had good strength and sensation in the feet with good pulses.  No significant abdominal tenderness or chest tenderness.  No significant back tenderness on my exam.  Patient did have tenderness in the right thumb on the palmar side on the pad.  No laceration seen.  No snuffbox tenderness.  Normal range of motion of the fingers.  Normal cap refill and pulses in the arms.  Clinical estimate patient has a right pelvic or hip fracture.  We will get x-rays of the hip and femur as well as the right hand.  We will get screening labs and preoperative testing as I anticipate she will likely have a fracture needing to be admitted.  We will get a COVID swab.  We will give pain medicine and make her n.p.o.  Anticipate reassessment after imaging is completed.  Care transferred to oncoming team awaiting for results of work-up.  Anticipate reassessment after imaging and labs are completed.   Final Clinical Impression(s) / ED Diagnoses Final diagnoses:  Fall, initial encounter  Right hip pain  Right hand pain    Clinical  Impression: 1. Fall, initial encounter   2. Pain   3. Right hip pain   4. Right hand pain     Disposition: Care transferred to oncoming team awaiting for results of work-up.  Anticipate reassessment after imaging and labs are completed.  This note was prepared with assistance of Systems analyst. Occasional wrong-word or sound-a-like substitutions may have occurred due to the inherent limitations of voice recognition software.     Rayon Mcchristian, Gwenyth Allegra, MD 05/07/21 1600

## 2021-05-07 NOTE — ED Provider Notes (Signed)
Medical Decision Making: Care of patient assumed from Dr. Sherry Ruffing at [phone].  Agree with history, physical exam and plan.  See their note for further details.  Briefly, The pt p/w hip pain right hand pain..   Current plan is as follows: Knee x-rays getting screening labs for possible surgical intervention.  Labs fairly unremarkable.  Orthopedic consulted for subcapital femoral neck fracture, they recommend admission to medicine for possible surgical intervention.  Medicine consulted they agree to admit.  Pain control provided.  The patient will be admitted to the hospitalist.  For the remainder this patient's care please see inpatient team notes.  I will intervene as needed while the patient remains in the emergency department.    I personally reviewed and interpreted all labs/imaging.      Breck Coons, MD 05/07/21 [phone]

## 2021-05-07 NOTE — Consult Note (Addendum)
Reason for Consult: Right hip fracture Referring Physician: EDP  Heather [name] is an 75 y.o. female.  HPI: Patient with fall and right hip pain  Past Medical History:  Diagnosis Date  . Atrial fibrillation ([place]) 08/14/2014  . Bell's palsy   . Bimalleolar fracture of left ankle   . Breast cancer ([place])   . Diabetes mellitus, type II ([place])   . Dysrhythmia    a-fib  . GERD (gastroesophageal reflux disease)   . Low back pain with sciatica   . Osteoarthritis   . Proliferative diabetic retinopathy of both eyes ([place])   . Restless legs 2012  . Sleep apnea    CPAP nightly    Past Surgical History:  Procedure Laterality Date  . both knees  2006  . both legs Bilateral 2006 and 2007  . cataractectomies    . JOINT REPLACEMENT     bil TKR and lt hip  . laser eye surgeries    . left hip replacement     . left knee replacement    . ORIF ANKLE FRACTURE Left 01/19/2016   Procedure: OPEN REDUCTION INTERNAL FIXATION (ORIF) LEFT ANKLE BIMALLEOLAR  FRACTURE;  Surgeon: Wylene Simmer, MD;  Location: [facility];  Service: Orthopedics;  Laterality: Left;  . pin removal left foot    . r breast due to cancer  2011  . repair of left foot fracture    . right foot surgery     . right knee replacement     . TOTAL HIP ARTHROPLASTY  2009  . VAGINAL HYSTERECTOMY      Family History  Problem Relation Age of Onset  . Diabetes Father   . Heart disease Father   . Diabetes Paternal Aunt        3 aunts had DM  . Thyroid disease Maternal Grandmother   . Diabetes Maternal Grandfather     Social History:  reports that she has never smoked. She has never used smokeless tobacco. She reports that she does not drink alcohol and does not use drugs.  Allergies:  Allergies  Allergen Reactions  . Sulfa Antibiotics Itching  . Sulfasalazine Itching    Medications: I have reviewed the patient's current medications.  Results for orders placed or performed during the hospital encounter of 05/07/21  (from the past 48 hour(s))  Resp Panel by RT-PCR (Flu A&B, Covid) Nasopharyngeal Swab     Status: None   Collection Time: 05/07/21  2:47 PM   Specimen: Nasopharyngeal Swab; Nasopharyngeal(NP) swabs in vial transport medium  Result Value Ref Range   SARS Coronavirus 2 by RT PCR NEGATIVE NEGATIVE    Comment: (NOTE) SARS-CoV-2 target nucleic acids are NOT DETECTED.  The SARS-CoV-2 RNA is generally detectable in upper respiratory specimens during the acute phase of infection. The lowest concentration of SARS-CoV-2 viral copies this assay can detect is 138 copies/mL. A negative result does not preclude SARS-Cov-2 infection and should not be used as the sole basis for treatment or other patient management decisions. A negative result may occur with  improper specimen collection/handling, submission of specimen other than nasopharyngeal swab, presence of viral mutation(s) within the areas targeted by this assay, and inadequate number of viral copies(<138 copies/mL). A negative result must be combined with clinical observations, patient history, and epidemiological information. The expected result is Negative.  Fact Sheet for Patients:  EntrepreneurPulse.com.au  Fact Sheet for Healthcare Providers:  IncredibleEmployment.be  This test is no t yet approved or cleared by the [place]  FDA and  has been authorized for detection and/or diagnosis of SARS-CoV-2 by FDA under an Emergency Use Authorization (EUA). This EUA will remain  in effect (meaning this test can be used) for the duration of the COVID-19 declaration under Section 564(b)(1) of the Act, 21 U.S.C.section 360bbb-3(b)(1), unless the authorization is terminated  or revoked sooner.       Influenza A by PCR NEGATIVE NEGATIVE   Influenza B by PCR NEGATIVE NEGATIVE    Comment: (NOTE) The Xpert Xpress SARS-CoV-2/FLU/RSV plus assay is intended as an aid in the diagnosis of influenza from  Nasopharyngeal swab specimens and should not be used as a sole basis for treatment. Nasal washings and aspirates are unacceptable for Xpert Xpress SARS-CoV-2/FLU/RSV testing.  Fact Sheet for Patients: EntrepreneurPulse.com.au  Fact Sheet for Healthcare Providers: IncredibleEmployment.be  This test is not yet approved or cleared by the [place] FDA and has been authorized for detection and/or diagnosis of SARS-CoV-2 by FDA under an Emergency Use Authorization (EUA). This EUA will remain in effect (meaning this test can be used) for the duration of the COVID-19 declaration under Section 564(b)(1) of the Act, 21 U.S.C. section 360bbb-3(b)(1), unless the authorization is terminated or revoked.  Performed at [facility], [address], [place], [place] [address]   CBC with Differential     Status: Abnormal   Collection Time: 05/07/21  3:17 PM  Result Value Ref Range   WBC 8.0 4.0 - 10.5 K/uL   RBC 4.93 3.87 - 5.11 MIL/uL   Hemoglobin 15.4 (H) 12.0 - 15.0 g/dL   HCT 46.6 (H) 36.0 - 46.0 %   MCV 94.5 80.0 - 100.0 fL   MCH 31.2 26.0 - 34.0 pg   MCHC 33.0 30.0 - 36.0 g/dL   RDW 12.0 11.5 - 15.5 %   Platelets 178 150 - 400 K/uL   nRBC 0.0 0.0 - 0.2 %   Neutrophils Relative % 73 %   Neutro Abs 5.8 1.7 - 7.7 K/uL   Lymphocytes Relative 19 %   Lymphs Abs 1.5 0.7 - 4.0 K/uL   Monocytes Relative 6 %   Monocytes Absolute 0.5 0.1 - 1.0 K/uL   Eosinophils Relative 1 %   Eosinophils Absolute 0.1 0.0 - 0.5 K/uL   Basophils Relative 0 %   Basophils Absolute 0.0 0.0 - 0.1 K/uL   Immature Granulocytes 1 %   Abs Immature Granulocytes 0.05 0.00 - 0.07 K/uL    Comment: Performed at [facility], [address] [place], [place] [address]  Basic metabolic panel     Status: Abnormal   Collection Time: 05/07/21  3:17 PM  Result Value Ref Range   Sodium 138 135 - 145 mmol/L   Potassium 4.3 3.5 - 5.1 mmol/L   Chloride 102 98 - 111 mmol/L    CO2 22 22 - 32 mmol/L   Glucose, Bld 237 (H) 70 - 99 mg/dL    Comment: Glucose reference range applies only to samples taken after fasting for at least 8 hours.   BUN 40 (H) 8 - 23 mg/dL   Creatinine, Ser 1.93 (H) 0.44 - 1.00 mg/dL   Calcium 8.8 (L) 8.9 - 10.3 mg/dL   GFR, Estimated 27 (L) >60 mL/min    Comment: (NOTE) Calculated using the CKD-EPI Creatinine Equation (2021)    Anion gap 14 5 - 15    Comment: Performed at [facility] [address] [place], [place] [address]    DG Chest  1 View  Result Date: 05/07/2021 CLINICAL DATA:  Pt c/o right hand pain, right leg pain, pelvic pain, and chest pain post fall outside a restaurant today. Hx of prior left hip replacement. Hx of breast cancer, dm, gerd. No hx of prior injuries or surgeries to any other areas. Pt is a nonsmoker. EXAM: CHEST  1 VIEW COMPARISON:  04/10/2021 FINDINGS: Cardiac silhouette is normal in size. No mediastinal or hilar masses. Lungs demonstrate thickened interstitial markings, chronic. No lung consolidation. No evidence of pulmonary edema. No pleural effusion or pneumothorax. Skeletal structures are grossly intact. IMPRESSION: No acute cardiopulmonary disease. Electronically Signed   By: Lajean Manes M.D.   On: 05/07/2021 16:34   DG Pelvis 1-2 Views  Result Date: 05/07/2021 CLINICAL DATA:  Pt c/o right hand pain, right leg pain, pelvic pain, and chest pain post fall outside a restaurant today. Hx of prior left hip replacement. Hx of breast cancer, dm, gerd. No hx of prior injuries or surgeries to any other areas. Pt is a nonsmoker. EXAM: PELVIS - 1-2 VIEW COMPARISON:  None. FINDINGS: Subtle subcapital nondisplaced, non comminuted fracture of the right femoral neck. No other fractures. Left total hip arthroplasty is well seated and aligned. Right hip joint, SI joints and symphysis pubis are normally spaced and aligned. Soft tissues are unremarkable. IMPRESSION: 1. Nondisplaced, non comminuted subcapital fracture of  the right femoral neck. No dislocation. No other fractures. Electronically Signed   By: Lajean Manes M.D.   On: 05/07/2021 16:37   DG Hand Complete Right  Result Date: 05/07/2021 CLINICAL DATA:  Pt c/o right hand pain, right leg pain, pelvic pain, and chest pain post fall outside a restaurant today. Hx of prior left hip replacement. Hx of breast cancer, dm, gerd. No hx of prior injuries or surgeries to any other areas. Pt is a nonsmoker. EXAM: RIGHT HAND - COMPLETE 3+ VIEW COMPARISON:  None. FINDINGS: No fracture or dislocation. Significant joint space narrowing, subchondral sclerosis and osteophyte formation at the trapezium first metacarpal articulation consistent with osteoarthritis. Milder osteoarthritic changes noted involving the IP joint of the thumb and DIP joints, index finger most notably involved. Skeletal structures are demineralized. Soft tissues are unremarkable. IMPRESSION: 1. No fracture or dislocation. Electronically Signed   By: Lajean Manes M.D.   On: 05/07/2021 16:33   DG Femur Min 2 Views Right  Result Date: 05/07/2021 CLINICAL DATA:  Pt c/o right hand pain, right leg pain, pelvic pain, and chest pain post fall outside a restaurant today. Hx of prior left hip replacement. Hx of breast cancer, dm, gerd. No hx of prior injuries or surgeries to any other areas. Pt is a nonsmoker.a EXAM: RIGHT FEMUR 2 VIEWS COMPARISON:  None. FINDINGS: Subtle subcapital fracture of the right femoral neck, nondisplaced and non comminuted. No other fractures.  No bone lesions. Knee total prosthesis appears well seated and well aligned. Hip joint is normally spaced and aligned. Atherosclerotic calcifications extend along the femoral arteries. Soft tissues otherwise unremarkable. IMPRESSION: 1. Nondisplaced, non comminuted subcapital fracture of the right femoral neck. No dislocation. Electronically Signed   By: Lajean Manes M.D.   On: 05/07/2021 16:36    Review of Systems  Musculoskeletal: Positive for  back pain.       Right hip pain  All other systems reviewed and are negative.  Blood pressure (!) 156/87, pulse 78, temperature 98.6 F (37 C), temperature source Oral, resp. rate (!) 22, SpO2 98 %. Physical Exam HENT:  Head: Normocephalic.  Eyes:     Pupils: Pupils are equal, round, and reactive to light.  Cardiovascular:     Rate and Rhythm: Normal rate.  Pulmonary:     Effort: Pulmonary effort is normal.  Abdominal:     Palpations: Abdomen is soft.  Musculoskeletal:        General: Tenderness and signs of injury present.     Cervical back: Normal range of motion.     Comments: Pain with attempted rotation right hip  Skin:    General: Skin is warm and dry.  Neurological:     General: No focal deficit present.     Mental Status: She is alert.  Psychiatric:        Mood and Affect: Mood normal.   Exam per EDP.  Assessment/Plan:  Patient has sustained a right subcapital femoral neck fracture  Plan is to proceed with operative intervention which may require arthroplasty versus ORIF.  Patient is currently on an anticoagulant Eliquis and will need to normalize for 48 hours prior to surgery.  The procedure will be performed either by Dr. Alvan Dame who will see the patient in the morning for further discussion or Dr. Lyla Glassing. Dr. Alvan Dame to see in AM  Bedrest, SCDs and TED stockings.  Analgesics as needed.  Incentive spirometer.  Addendum:  Discussed with patient surgery required and the delay due to the Eliqius. Dr. Alvan Dame PA to see this AM    Johnn Hai 05/07/2021, 7:12 PM

## 2021-05-07 NOTE — ED Notes (Signed)
Pt's O2 sat decreased to 88% on RA. Pt placed on 2lpm via [place].

## 2021-05-08 ENCOUNTER — Encounter (HOSPITAL_COMMUNITY): Payer: Self-pay | Admitting: Student in an Organized Health Care Education/Training Program

## 2021-05-08 DIAGNOSIS — I4811 Longstanding persistent atrial fibrillation: Secondary | ICD-10-CM | POA: Diagnosis not present

## 2021-05-08 DIAGNOSIS — S72001A Fracture of unspecified part of neck of right femur, initial encounter for closed fracture: Secondary | ICD-10-CM | POA: Diagnosis not present

## 2021-05-08 DIAGNOSIS — E1142 Type 2 diabetes mellitus with diabetic polyneuropathy: Secondary | ICD-10-CM

## 2021-05-08 DIAGNOSIS — E1122 Type 2 diabetes mellitus with diabetic chronic kidney disease: Secondary | ICD-10-CM

## 2021-05-08 LAB — LIPID PANEL
Cholesterol: 139 mg/dL (ref 0–200)
HDL: 50 mg/dL (ref >40)
LDL Cholesterol: 76 mg/dL (ref 0–99)
Total CHOL/HDL Ratio: 2.8 RATIO
Triglycerides: 66 mg/dL (ref <150)
VLDL: 13 mg/dL (ref 0–40)

## 2021-05-08 LAB — CBC
HCT: 45.4 % (ref 36.0–46.0)
Hemoglobin: 14.7 g/dL (ref 12.0–15.0)
MCH: 30.7 pg (ref 26.0–34.0)
MCHC: 32.4 g/dL (ref 30.0–36.0)
MCV: 94.8 fL (ref 80.0–100.0)
Platelets: 147 10*3/uL — ABNORMAL LOW (ref 150–400)
RBC: 4.79 MIL/uL (ref 3.87–5.11)
RDW: 12.2 % (ref 11.5–15.5)
WBC: 8.3 10*3/uL (ref 4.0–10.5)
nRBC: 0 % (ref 0.0–0.2)

## 2021-05-08 LAB — BASIC METABOLIC PANEL
Anion gap: 9 (ref 5–15)
BUN: 40 mg/dL — ABNORMAL HIGH (ref 8–23)
CO2: 23 mmol/L (ref 22–32)
Calcium: 8.4 mg/dL — ABNORMAL LOW (ref 8.9–10.3)
Chloride: 103 mmol/L (ref 98–111)
Creatinine, Ser: 1.79 mg/dL — ABNORMAL HIGH (ref 0.44–1.00)
GFR, Estimated: 29 mL/min — ABNORMAL LOW (ref >60)
Glucose, Bld: 250 mg/dL — ABNORMAL HIGH (ref 70–99)
Potassium: 4.6 mmol/L (ref 3.5–5.1)
Sodium: 135 mmol/L (ref 135–145)

## 2021-05-08 LAB — HEPATIC FUNCTION PANEL
ALT: 187 U/L — ABNORMAL HIGH (ref 0–44)
AST: 325 U/L — ABNORMAL HIGH (ref 15–41)
Albumin: 3 g/dL — ABNORMAL LOW (ref 3.5–5.0)
Alkaline Phosphatase: 52 U/L (ref 38–126)
Bilirubin, Direct: 0.2 mg/dL (ref 0.0–0.2)
Indirect Bilirubin: 0.9 mg/dL (ref 0.3–0.9)
Total Bilirubin: 1.1 mg/dL (ref 0.3–1.2)
Total Protein: 6.3 g/dL — ABNORMAL LOW (ref 6.5–8.1)

## 2021-05-08 LAB — GLUCOSE, CAPILLARY
Glucose-Capillary: 215 mg/dL — ABNORMAL HIGH (ref 70–99)
Glucose-Capillary: 174 mg/dL — ABNORMAL HIGH (ref 70–99)
Glucose-Capillary: 102 mg/dL — ABNORMAL HIGH (ref 70–99)
Glucose-Capillary: 129 mg/dL — ABNORMAL HIGH (ref 70–99)
Glucose-Capillary: 131 mg/dL — ABNORMAL HIGH (ref 70–99)

## 2021-05-08 LAB — SURGICAL PCR SCREEN
MRSA, PCR: NEGATIVE
Staphylococcus aureus: NEGATIVE

## 2021-05-08 MED ORDER — ZOLPIDEM TARTRATE 5 MG PO TABS
5.0000 mg | ORAL_TABLET | Freq: Every evening | ORAL | Status: DC | PRN
  Administered 2021-05-08 – 2021-05-11 (×3): 5 mg via ORAL
  Filled 2021-05-08 (×3): qty 1

## 2021-05-08 MED ORDER — LORATADINE 10 MG PO TABS
10.0000 mg | ORAL_TABLET | Freq: Every day | ORAL | Status: DC | PRN
  Administered 2021-05-08 – 2021-05-09 (×2): 10 mg via ORAL
  Filled 2021-05-08 (×3): qty 1

## 2021-05-08 MED ORDER — ROSUVASTATIN CALCIUM 20 MG PO TABS: 20.0000 mg | ORAL_TABLET | Freq: Every day | ORAL | Status: DC

## 2021-05-08 MED ORDER — LORATADINE 10 MG PO TABS: 10.0000 mg | ORAL_TABLET | Freq: Every day | ORAL | Status: DC

## 2021-05-08 MED ORDER — SENNA 8.6 MG PO TABS
1.0000 | ORAL_TABLET | Freq: Two times a day (BID) | ORAL | Status: DC
  Administered 2021-05-09 – 2021-05-11 (×6): 8.6 mg via ORAL
  Filled 2021-05-08 (×8): qty 1

## 2021-05-08 MED ORDER — INSULIN GLARGINE 100 UNIT/ML ~~LOC~~ SOLN
10.0000 [IU] | Freq: Once | SUBCUTANEOUS | Status: DC
  Filled 2021-05-08: qty 0.1

## 2021-05-08 MED ORDER — ROSUVASTATIN CALCIUM 20 MG PO TABS: 40.0000 mg | ORAL_TABLET | Freq: Every day | ORAL | Status: DC

## 2021-05-08 MED ORDER — INSULIN GLARGINE 100 UNIT/ML ~~LOC~~ SOLN
25.0000 [IU] | Freq: Once | SUBCUTANEOUS | Status: AC
  Administered 2021-05-08: 25 [IU] via SUBCUTANEOUS
  Filled 2021-05-08: qty 0.25

## 2021-05-08 NOTE — Progress Notes (Signed)
FPTS Interim Progress Note  S:  Patient indicated her pain is currently well controlled.    O: BP 103/63 (BP Location: Left Arm)   Pulse (!) 56   Temp 98.4 F (36.9 C) (Oral)   Resp 16   SpO2 (!) 87%    Physical Exam Constitutional:      Appearance: Normal appearance.  HENT:     Head: Normocephalic and atraumatic.     Mouth/Throat:     Mouth: Mucous membranes are moist.  Cardiovascular:     Rate and Rhythm: Normal rate and regular rhythm.     Pulses: Normal pulses.  Pulmonary:     Effort: Pulmonary effort is normal.     Breath sounds: Normal breath sounds.  Neurological:     General: No focal deficit present.     Mental Status: She is alert.  Psychiatric:        Mood and Affect: Mood normal.        Behavior: Behavior normal.      A/P: Hip Fracture Pain well controlled at this time.  Had concerns after about getting around house.  Discussed with patient home health resources that she may be able to get after procedure to help patient.  Delora Fuel, MD 05/08/2021, 10:14 PM PGY-1, [facility] pager [phone]

## 2021-05-08 NOTE — Progress Notes (Signed)
Report given to Naomi, RN

## 2021-05-08 NOTE — Consult Note (Signed)
Reason for Consult: right hip femoral neck fracture Referring Physician: Dorris Singh, MD  Heather [name] is an 75 y.o. female.  HPI: Came in with right hip pain and right hand pain after fall.  Hemodynamically stable.  No concerning signs of neuorvascular compromise on physical exam.  Has history of falls with fractures but has not had one in several years.  Has bilateral knee replacements (2006 & 2007) and left hip replacement (2009).  Indicates has history of osteopoosis ad had bone density test but does not recall results and does not take calcium supplementation or have any other intervention.  Vit D and PTH normal levels in 3/22.  Right hand X-Ray shows no fracture or dislocation.  Nondisplaced, non comminuted subcapital fracture of the right femoral neck. No dislocation.  Type and screen obtained.  Seen by Orthopedic Surgery and indicated would plan for procedure 48 hours after last dose of Eliquis.  Will admit for pain control and planned surgical intervention.  Reports that her foot got caught up in a crack and she was unable to lift her foot out resulting in fall  Past Medical History:  Diagnosis Date  . Atrial fibrillation ([place]) 08/14/2014  . Bell's palsy   . Bimalleolar fracture of left ankle   . Breast cancer ([place])   . Diabetes mellitus, type II ([place])   . Dysrhythmia    a-fib  . GERD (gastroesophageal reflux disease)   . Low back pain with sciatica   . Osteoarthritis   . Proliferative diabetic retinopathy of both eyes ([place])   . Restless legs 2012  . Sleep apnea    CPAP nightly    Past Surgical History:  Procedure Laterality Date  . both knees  2006  . both legs Bilateral 2006 and 2007  . cataractectomies    . JOINT REPLACEMENT     bil TKR and lt hip  . laser eye surgeries    . left hip replacement     . left knee replacement    . ORIF ANKLE FRACTURE Left 01/19/2016   Procedure: OPEN REDUCTION INTERNAL FIXATION (ORIF) LEFT ANKLE BIMALLEOLAR  FRACTURE;  Surgeon: Wylene Simmer, MD;  Location: [facility];  Service: Orthopedics;  Laterality: Left;  . pin removal left foot    . r breast due to cancer  2011  . repair of left foot fracture    . right foot surgery     . right knee replacement     . TOTAL HIP ARTHROPLASTY  2009  . VAGINAL HYSTERECTOMY      Family History  Problem Relation Age of Onset  . Diabetes Father   . Heart disease Father   . Diabetes Paternal Aunt        3 aunts had DM  . Thyroid disease Maternal Grandmother   . Diabetes Maternal Grandfather     Social History:  reports that she has never smoked. She has never used smokeless tobacco. She reports that she does not drink alcohol and does not use drugs.  Allergies:  Allergies  Allergen Reactions  . Sulfa Antibiotics Itching  . Sulfasalazine Itching    Medications:  I have reviewed the patient's current medications. Scheduled: . DULoxetine  60 mg Oral q morning  . heparin  5,000 Units Subcutaneous Q8H  . insulin aspart  0-9 Units Subcutaneous TID WC  . insulin glargine  25 Units Subcutaneous Once  . metoprolol tartrate  25 mg Oral BID  . polyethylene glycol  17 g Oral  BID  . rOPINIRole  4 mg Oral Daily  . Ropinirole HCl  6 mg Oral q morning  . rosuvastatin  40 mg Oral QHS    Results for orders placed or performed during the hospital encounter of 05/07/21 (from the past 24 hour(s))  Resp Panel by RT-PCR (Flu A&B, Covid) Nasopharyngeal Swab     Status: None   Collection Time: 05/07/21  2:47 PM   Specimen: Nasopharyngeal Swab; Nasopharyngeal(NP) swabs in vial transport medium  Result Value Ref Range   SARS Coronavirus 2 by RT PCR NEGATIVE NEGATIVE   Influenza A by PCR NEGATIVE NEGATIVE   Influenza B by PCR NEGATIVE NEGATIVE  CBC with Differential     Status: Abnormal   Collection Time: 05/07/21  3:17 PM  Result Value Ref Range   WBC 8.0 4.0 - 10.5 K/uL   RBC 4.93 3.87 - 5.11 MIL/uL   Hemoglobin 15.4 (H) 12.0 - 15.0 g/dL   HCT 46.6 (H) 36.0 - 46.0 %    MCV 94.5 80.0 - 100.0 fL   MCH 31.2 26.0 - 34.0 pg   MCHC 33.0 30.0 - 36.0 g/dL   RDW 12.0 11.5 - 15.5 %   Platelets 178 150 - 400 K/uL   nRBC 0.0 0.0 - 0.2 %   Neutrophils Relative % 73 %   Neutro Abs 5.8 1.7 - 7.7 K/uL   Lymphocytes Relative 19 %   Lymphs Abs 1.5 0.7 - 4.0 K/uL   Monocytes Relative 6 %   Monocytes Absolute 0.5 0.1 - 1.0 K/uL   Eosinophils Relative 1 %   Eosinophils Absolute 0.1 0.0 - 0.5 K/uL   Basophils Relative 0 %   Basophils Absolute 0.0 0.0 - 0.1 K/uL   Immature Granulocytes 1 %   Abs Immature Granulocytes 0.05 0.00 - 0.07 K/uL  Basic metabolic panel     Status: Abnormal   Collection Time: 05/07/21  3:17 PM  Result Value Ref Range   Sodium 138 135 - 145 mmol/L   Potassium 4.3 3.5 - 5.1 mmol/L   Chloride 102 98 - 111 mmol/L   CO2 22 22 - 32 mmol/L   Glucose, Bld 237 (H) 70 - 99 mg/dL   BUN 40 (H) 8 - 23 mg/dL   Creatinine, Ser 1.93 (H) 0.44 - 1.00 mg/dL   Calcium 8.8 (L) 8.9 - 10.3 mg/dL   GFR, Estimated 27 (L) >60 mL/min   Anion gap 14 5 - 15  ABO/Rh     Status: None   Collection Time: 05/07/21  7:46 PM  Result Value Ref Range   ABO/RH(D)      B POS Performed at [facility], [address] [place], [place] [address]   Glucose, capillary     Status: Abnormal   Collection Time: 05/07/21 11:07 PM  Result Value Ref Range   Glucose-Capillary 232 (H) 70 - 99 mg/dL  Surgical pcr screen     Status: None   Collection Time: 05/07/21 11:48 PM   Specimen: Nasal Mucosa; Nasal Swab  Result Value Ref Range   MRSA, PCR NEGATIVE NEGATIVE   Staphylococcus aureus NEGATIVE NEGATIVE  Hepatic function panel     Status: Abnormal   Collection Time: 05/08/21 12:13 AM  Result Value Ref Range   Total Protein 6.3 (L) 6.5 - 8.1 g/dL   Albumin 3.0 (L) 3.5 - 5.0 g/dL   AST 325 (H) 15 - 41 U/L   ALT 187 (H) 0 - 44 U/L   Alkaline Phosphatase 52  38 - 126 U/L   Total Bilirubin 1.1 0.3 - 1.2 mg/dL   Bilirubin, Direct 0.2 0.0 - 0.2 mg/dL   Indirect Bilirubin 0.9  0.3 - 0.9 mg/dL  Type and screen [place]     Status: None   Collection Time: 05/08/21 12:13 AM  Result Value Ref Range   ABO/RH(D) B POS    Antibody Screen NEG    Sample Expiration      05/11/2021,2359 Performed at [facility], [place] [address] [place], [place] [address]   CBC     Status: Abnormal   Collection Time: 05/08/21 12:13 AM  Result Value Ref Range   WBC 8.3 4.0 - 10.5 K/uL   RBC 4.79 3.87 - 5.11 MIL/uL   Hemoglobin 14.7 12.0 - 15.0 g/dL   HCT 45.4 36.0 - 46.0 %   MCV 94.8 80.0 - 100.0 fL   MCH 30.7 26.0 - 34.0 pg   MCHC 32.4 30.0 - 36.0 g/dL   RDW 12.2 11.5 - 15.5 %   Platelets 147 (L) 150 - 400 K/uL   nRBC 0.0 0.0 - 0.2 %  Basic metabolic panel     Status: Abnormal   Collection Time: 05/08/21 12:13 AM  Result Value Ref Range   Sodium 135 135 - 145 mmol/L   Potassium 4.6 3.5 - 5.1 mmol/L   Chloride 103 98 - 111 mmol/L   CO2 23 22 - 32 mmol/L   Glucose, Bld 250 (H) 70 - 99 mg/dL   BUN 40 (H) 8 - 23 mg/dL   Creatinine, Ser 1.79 (H) 0.44 - 1.00 mg/dL   Calcium 8.4 (L) 8.9 - 10.3 mg/dL   GFR, Estimated 29 (L) >60 mL/min   Anion gap 9 5 - 15  Lipid panel     Status: None   Collection Time: 05/08/21 12:13 AM  Result Value Ref Range   Cholesterol 139 0 - 200 mg/dL   Triglycerides 66 <150 mg/dL   HDL 50 >40 mg/dL   Total CHOL/HDL Ratio 2.8 RATIO   VLDL 13 0 - 40 mg/dL   LDL Cholesterol 76 0 - 99 mg/dL  Glucose, capillary     Status: Abnormal   Collection Time: 05/08/21  3:51 AM  Result Value Ref Range   Glucose-Capillary 215 (H) 70 - 99 mg/dL    X-ray: EXAM: PELVIS - 1-2 VIEW  COMPARISON:  None.  FINDINGS: Subtle subcapital nondisplaced, non comminuted fracture of the right femoral neck.  No other fractures.  Left total hip arthroplasty is well seated and aligned.  Right hip joint, SI joints and symphysis pubis are normally spaced and aligned.  Soft tissues are unremarkable.  IMPRESSION: 1. Nondisplaced, non  comminuted subcapital fracture of the right femoral neck. No dislocation. No other fractures.   Electronically Signed   By: Lajean Manes M.D.  ROS  As per admitting HPI  Blood pressure 128/72, pulse 71, temperature 98.6 F (37 C), temperature source Oral, resp. rate 17, SpO2 97 %.  Physical Exam: Constitutional:      General: She is not in acute distress.    Appearance: Normal appearance. She is not ill-appearing.  HENT:     Head: Normocephalic and atraumatic.     Mouth/Throat:     Mouth: Mucous membranes are moist.  Cardiovascular:     Rate and Rhythm: Normal rate. Rhythm irregular.     Pulses: Normal pulses.  Pulmonary:     Effort: Pulmonary effort is normal.  Breath sounds: Normal breath sounds.  Musculoskeletal:        General: Deformity present.     Right hip: No deformity or lacerations.     Left hip: No deformity or lacerations.     Right lower leg: No edema.     Left lower leg: No edema.     Comments: No sign of bruising or obvious deformity  Skin:    General: Skin is warm.  Neurological:     Mental Status: She is alert.     Motor: Motor function is intact.     Comments: Able to wiggle toes bilaterally without issu  Assessment/Plan: 1. Right hip femoral neck fracture  Plan: She will need surgical intervention to help with pain and mobility Based on successful history of arthroplasty she opts to have her right hip replaced in the setting of her femoral neck fracture NPO after midnight for likely surgery tomorrow  Mauri Pole 05/08/2021, 7:43 AM

## 2021-05-08 NOTE — Progress Notes (Signed)
Patient arrived to 0I21, [id number]. No acute distress noted. No DOBSOB. 99%/2L. c/o 9/10 pain to R hip- medicated PRN. Fall precautions in place. Patient was oriented to call bell and surroundings. Call bell within reach and will continue to monitor.

## 2021-05-08 NOTE — Progress Notes (Addendum)
Family Medicine Teaching Service Daily Progress Note Intern Pager: [phone]  Patient name: Heather [name] Medical record number: [id number] Date of birth: 07/01/1946 Age: 75 y.o. Gender: female  Primary Care Provider: Greig Right, MD Consultants: Ortho Code Status: DNR   Pt Overview and Major Events to Date:  5/29 Admitted   Assessment and Plan: Heather [name] is a 75 y.o. female presenting with right subcapital femoral neck fracture. PMH is significant for bilateral knee replacements (2006 & 2007) and left hip replacement (2009), A-fib on anticoagulation, CKD stage 3b, T2DM, HLD and RLS.  Right subcapital femoral neck fracture  Nondisplaced, non comminuted subcapital fracture of the right femoral neck without dislocation. Per Orthopedic Surgery, planning for operative intervention (hip replacement) likely tomorrow.  Required 12mg  Morphine, and 2.5mg  Dilaudid, 27mcg Fentanyl.  - Ortho following, appreciate recommendations - Dilaudid 0.5-1mg  q4hr PRN  - Incentive spirometer - bedrest - miralax and senna BID  - Carb modified diet - NPO midnight before procedure  Afib on Anticoagulation Rate controlled with Metoprolol. HR 71. On Eliquis 5 mg BID.  Holding for procedure. EKG shows atrial fibrillation with rate of 72bpm Revised cardiac risk index (RCRI) score 1: 6% (slightly increased) 30 day risk of death, MI, or cardiac arrest.  chads2vasc score of 4 indicating mod-high risk (6.7% stroke risk)  - Hold Eliquis, restart 24-72 hr - Metoprolol Tartrate 25 mg BID  AKI on CKD stage 3B Cr 1.79 down from 1.93 yesterday. Last Creatinine 1.6 in March.  - Continue to monitor - am BMP   Type 2 Diabetes AM blood glucose 250. Last A1C was 7 per patient.  Monitors frequently at home with Dexcom device.   Home medication: 50 U Lantus and Farxiga 10 mg every morning.   - f/u A1C - sSSI - Lantus 25u once this am  - CBG's with meals and nightly - Carb modified diet, NPO at midnight    Transaminitis  AST 325, ALT 187. Patient denies alcohol use. Denies hx of liver dysfunction.  - hepatitis panel  - consider RUQ [place]  Hyperlipidemia Lipid panel wnl. LDL 76. On Crestor 40 mg at home.  RLS Patient takes Requip 6 mg every morning. - Continue home requip  Hx Falls  Hx osteoporosis  Bilateral knee replacements (2006 & 2007) and L hip replacement (2009). Vit D and PTH normal levels in 3/22. Not on medication or supplementation - f/u Vit D level   FEN/GI: Carb modified diet, NPO midnight  Prophylaxis: SCDs   Disposition: med-surg   Subjective:  Overnight patient desatted to 88% and placed on 2L . Patient does state she has OSA and does have CPAP at home but does not like using it. Does not want to use while in the hospital. Additionally, denies alcohol use or any history of liver issues. Denies abdominal pain.   Objective: Temp:  [98.5 F (36.9 C)-99.4 F (37.4 C)] 98.6 F (37 C) (05/30 0353) Pulse Rate:  [71-95] 71 (05/30 0353) Resp:  [17-22] 17 (05/30 0353) BP: (110-168)/(42-87) 128/72 (05/30 0353) SpO2:  [96 %-100 %] 97 % (05/30 0353) Physical Exam: General: alert, pleasant, NAD Cardiovascular: Regular rate, irregular rhythm, No murmurs  Respiratory: CTAB. Normal WOB Abdomen: soft, non distended Extremities: warm, dry. No LE edema. Pedal pulses 2+ bilaterally. Able to move LE spontaneously.   Laboratory: Recent Labs  Lab 05/07/21 1517 05/08/21 0013  WBC 8.0 8.3  HGB 15.4* 14.7  HCT 46.6* 45.4  PLT 178 147*   Recent Labs  Lab 05/07/21 1517  05/08/21 0013  NA 138 135  K 4.3 4.6  CL 102 103  CO2 22 23  BUN 40* 40*  CREATININE 1.93* 1.79*  CALCIUM 8.8* 8.4*  PROT  --  6.3*  BILITOT  --  1.1  ALKPHOS  --  52  ALT  --  187*  AST  --  325*  GLUCOSE 237* 250*    Imaging/Diagnostic Tests:  DG Chest 1 View  Result Date: 05/07/2021 CLINICAL DATA:  Pt c/o right hand pain, right leg pain, pelvic pain, and chest pain post fall outside a  restaurant today. Hx of prior left hip replacement. Hx of breast cancer, dm, gerd. No hx of prior injuries or surgeries to any other areas. Pt is a nonsmoker. EXAM: CHEST  1 VIEW COMPARISON:  04/10/2021 FINDINGS: Cardiac silhouette is normal in size. No mediastinal or hilar masses. Lungs demonstrate thickened interstitial markings, chronic. No lung consolidation. No evidence of pulmonary edema. No pleural effusion or pneumothorax. Skeletal structures are grossly intact. IMPRESSION: No acute cardiopulmonary disease. Electronically Signed   By: Lajean Manes M.D.   On: 05/07/2021 16:34   DG Pelvis 1-2 Views  Result Date: 05/07/2021 CLINICAL DATA:  Pt c/o right hand pain, right leg pain, pelvic pain, and chest pain post fall outside a restaurant today. Hx of prior left hip replacement. Hx of breast cancer, dm, gerd. No hx of prior injuries or surgeries to any other areas. Pt is a nonsmoker. EXAM: PELVIS - 1-2 VIEW COMPARISON:  None. FINDINGS: Subtle subcapital nondisplaced, non comminuted fracture of the right femoral neck. No other fractures. Left total hip arthroplasty is well seated and aligned. Right hip joint, SI joints and symphysis pubis are normally spaced and aligned. Soft tissues are unremarkable. IMPRESSION: 1. Nondisplaced, non comminuted subcapital fracture of the right femoral neck. No dislocation. No other fractures. Electronically Signed   By: Lajean Manes M.D.   On: 05/07/2021 16:37   DG Hand Complete Right  Result Date: 05/07/2021 CLINICAL DATA:  Pt c/o right hand pain, right leg pain, pelvic pain, and chest pain post fall outside a restaurant today. Hx of prior left hip replacement. Hx of breast cancer, dm, gerd. No hx of prior injuries or surgeries to any other areas. Pt is a nonsmoker. EXAM: RIGHT HAND - COMPLETE 3+ VIEW COMPARISON:  None. FINDINGS: No fracture or dislocation. Significant joint space narrowing, subchondral sclerosis and osteophyte formation at the trapezium first  metacarpal articulation consistent with osteoarthritis. Milder osteoarthritic changes noted involving the IP joint of the thumb and DIP joints, index finger most notably involved. Skeletal structures are demineralized. Soft tissues are unremarkable. IMPRESSION: 1. No fracture or dislocation. Electronically Signed   By: Lajean Manes M.D.   On: 05/07/2021 16:33   DG Femur Min 2 Views Right  Result Date: 05/07/2021 CLINICAL DATA:  Pt c/o right hand pain, right leg pain, pelvic pain, and chest pain post fall outside a restaurant today. Hx of prior left hip replacement. Hx of breast cancer, dm, gerd. No hx of prior injuries or surgeries to any other areas. Pt is a nonsmoker.a EXAM: RIGHT FEMUR 2 VIEWS COMPARISON:  None. FINDINGS: Subtle subcapital fracture of the right femoral neck, nondisplaced and non comminuted. No other fractures.  No bone lesions. Knee total prosthesis appears well seated and well aligned. Hip joint is normally spaced and aligned. Atherosclerotic calcifications extend along the femoral arteries. Soft tissues otherwise unremarkable. IMPRESSION: 1. Nondisplaced, non comminuted subcapital fracture of the right femoral neck. No dislocation.  Electronically Signed   By: Lajean Manes M.D.   On: 05/07/2021 16:36    Shary Key, DO 05/08/2021, 6:32 AM PGY-1, [facility] [place] Intern pager: [phone], text pages welcome

## 2021-05-09 ENCOUNTER — Inpatient Hospital Stay (HOSPITAL_COMMUNITY): Payer: PPO

## 2021-05-09 ENCOUNTER — Encounter (HOSPITAL_COMMUNITY)
Admission: EM | Disposition: A | Discharge status: Home Health | Payer: Self-pay | Source: Home / Self Care | Attending: Family Medicine

## 2021-05-09 ENCOUNTER — Inpatient Hospital Stay (HOSPITAL_COMMUNITY): Payer: PPO | Admitting: Anesthesiology

## 2021-05-09 ENCOUNTER — Encounter (HOSPITAL_COMMUNITY): Payer: Self-pay | Admitting: Student in an Organized Health Care Education/Training Program

## 2021-05-09 DIAGNOSIS — Z96649 Presence of unspecified artificial hip joint: Secondary | ICD-10-CM

## 2021-05-09 HISTORY — PX: TOTAL HIP ARTHROPLASTY: SHX124

## 2021-05-09 LAB — VITAMIN D 25 HYDROXY (VIT D DEFICIENCY, FRACTURES): Vit D, 25-Hydroxy: 40.21 ng/mL (ref 30–100)

## 2021-05-09 LAB — HEPATITIS PANEL, ACUTE
HCV Ab: NONREACTIVE
Hep B C IgM: NONREACTIVE
Hepatitis B Surface Ag: NONREACTIVE

## 2021-05-09 LAB — GLUCOSE, CAPILLARY
Glucose-Capillary: 105 mg/dL — ABNORMAL HIGH (ref 70–99)
Glucose-Capillary: 96 mg/dL (ref 70–99)
Glucose-Capillary: 92 mg/dL (ref 70–99)
Glucose-Capillary: 79 mg/dL (ref 70–99)
Glucose-Capillary: 81 mg/dL (ref 70–99)
Glucose-Capillary: 111 mg/dL — ABNORMAL HIGH (ref 70–99)
Glucose-Capillary: 132 mg/dL — ABNORMAL HIGH (ref 70–99)

## 2021-05-09 LAB — TYPE AND SCREEN
ABO/RH(D): B POS
Antibody Screen: NEGATIVE

## 2021-05-09 LAB — CBC
HCT: 40.5 % (ref 36.0–46.0)
Hemoglobin: 13.4 g/dL (ref 12.0–15.0)
MCH: 30.9 pg (ref 26.0–34.0)
MCHC: 33.1 g/dL (ref 30.0–36.0)
MCV: 93.5 fL (ref 80.0–100.0)
Platelets: 131 10*3/uL — ABNORMAL LOW (ref 150–400)
RBC: 4.33 MIL/uL (ref 3.87–5.11)
RDW: 11.9 % (ref 11.5–15.5)
WBC: 5.6 10*3/uL (ref 4.0–10.5)
nRBC: 0 % (ref 0.0–0.2)

## 2021-05-09 LAB — COMPREHENSIVE METABOLIC PANEL
ALT: 160 U/L — ABNORMAL HIGH (ref 0–44)
AST: 116 U/L — ABNORMAL HIGH (ref 15–41)
Albumin: 2.7 g/dL — ABNORMAL LOW (ref 3.5–5.0)
Alkaline Phosphatase: 60 U/L (ref 38–126)
Anion gap: 8 (ref 5–15)
BUN: 37 mg/dL — ABNORMAL HIGH (ref 8–23)
CO2: 25 mmol/L (ref 22–32)
Calcium: 8.4 mg/dL — ABNORMAL LOW (ref 8.9–10.3)
Chloride: 103 mmol/L (ref 98–111)
Creatinine, Ser: 1.88 mg/dL — ABNORMAL HIGH (ref 0.44–1.00)
GFR, Estimated: 28 mL/min — ABNORMAL LOW (ref >60)
Glucose, Bld: 104 mg/dL — ABNORMAL HIGH (ref 70–99)
Potassium: 4.5 mmol/L (ref 3.5–5.1)
Sodium: 136 mmol/L (ref 135–145)
Total Bilirubin: 2.1 mg/dL — ABNORMAL HIGH (ref 0.3–1.2)
Total Protein: 6 g/dL — ABNORMAL LOW (ref 6.5–8.1)

## 2021-05-09 LAB — HCV INTERPRETATION

## 2021-05-09 LAB — HEMOGLOBIN A1C
Hgb A1c MFr Bld: 6.9 % — ABNORMAL HIGH (ref 4.8–5.6)
Mean Plasma Glucose: 151 mg/dL

## 2021-05-09 LAB — ABO/RH: ABO/RH(D): B POS

## 2021-05-09 SURGERY — ARTHROPLASTY, HIP, TOTAL, ANTERIOR APPROACH
Anesthesia: General | Site: Hip | Laterality: Right

## 2021-05-09 MED ORDER — DEXAMETHASONE SODIUM PHOSPHATE 10 MG/ML IJ SOLN
INTRAMUSCULAR | Status: AC
  Filled 2021-05-09: qty 1

## 2021-05-09 MED ORDER — FENTANYL CITRATE (PF) 100 MCG/2ML IJ SOLN
INTRAMUSCULAR | Status: DC | PRN
  Administered 2021-05-09: 150 ug via INTRAVENOUS

## 2021-05-09 MED ORDER — ACETAMINOPHEN 325 MG PO TABS: 325.0000 mg | ORAL_TABLET | Freq: Four times a day (QID) | ORAL | Status: DC | PRN

## 2021-05-09 MED ORDER — METOCLOPRAMIDE HCL 5 MG PO TABS: 5.0000 mg | ORAL_TABLET | Freq: Three times a day (TID) | ORAL | Status: DC | PRN

## 2021-05-09 MED ORDER — DOCUSATE SODIUM 100 MG PO CAPS
100.0000 mg | ORAL_CAPSULE | Freq: Two times a day (BID) | ORAL | Status: DC
  Administered 2021-05-09 – 2021-05-10 (×2): 100 mg via ORAL
  Filled 2021-05-09 (×2): qty 1

## 2021-05-09 MED ORDER — SODIUM CHLORIDE 0.9 % IR SOLN
Status: DC | PRN
  Administered 2021-05-09: 1

## 2021-05-09 MED ORDER — DEXTROSE 50 % IV SOLN: 12.5000 g | Freq: Once | INTRAVENOUS | Status: AC

## 2021-05-09 MED ORDER — PHENOL 1.4 % MT LIQD: 1.0000 | OROMUCOSAL | Status: DC | PRN

## 2021-05-09 MED ORDER — FENTANYL CITRATE (PF) 250 MCG/5ML IJ SOLN
INTRAMUSCULAR | Status: AC
  Filled 2021-05-09: qty 5

## 2021-05-09 MED ORDER — MORPHINE SULFATE (PF) 2 MG/ML IV SOLN
0.5000 mg | INTRAVENOUS | Status: DC | PRN
Start: 2021-05-09 — End: 2021-05-10

## 2021-05-09 MED ORDER — DEXAMETHASONE SODIUM PHOSPHATE 10 MG/ML IJ SOLN
INTRAMUSCULAR | Status: DC | PRN
  Administered 2021-05-09: 4 mg via INTRAVENOUS

## 2021-05-09 MED ORDER — SUGAMMADEX SODIUM 200 MG/2ML IV SOLN
INTRAVENOUS | Status: DC | PRN
  Administered 2021-05-09: 200 mg via INTRAVENOUS

## 2021-05-09 MED ORDER — POLYETHYLENE GLYCOL 3350 17 G PO PACK: 17.0000 g | PACK | Freq: Every day | ORAL | Status: DC | PRN

## 2021-05-09 MED ORDER — ROPINIROLE HCL 0.5 MG PO TABS
2.0000 mg | ORAL_TABLET | Freq: Once | ORAL | Status: AC
  Administered 2021-05-09: 2 mg via ORAL
  Filled 2021-05-09: qty 4

## 2021-05-09 MED ORDER — TRAMADOL HCL 50 MG PO TABS: 50.0000 mg | ORAL_TABLET | Freq: Four times a day (QID) | ORAL | Status: DC | PRN

## 2021-05-09 MED ORDER — ACETAMINOPHEN 500 MG PO TABS: 1000.0000 mg | ORAL_TABLET | Freq: Once | ORAL | Status: DC

## 2021-05-09 MED ORDER — BISACODYL 10 MG RE SUPP: 10.0000 mg | Freq: Every day | RECTAL | Status: DC | PRN

## 2021-05-09 MED ORDER — ONDANSETRON HCL 4 MG PO TABS: 4.0000 mg | ORAL_TABLET | Freq: Four times a day (QID) | ORAL | Status: DC | PRN

## 2021-05-09 MED ORDER — ACETAMINOPHEN 500 MG PO TABS
ORAL_TABLET | ORAL | Status: AC
  Administered 2021-05-09: 1000 mg via ORAL
  Filled 2021-05-09: qty 2

## 2021-05-09 MED ORDER — METOCLOPRAMIDE HCL 5 MG/ML IJ SOLN: 5.0000 mg | Freq: Three times a day (TID) | INTRAMUSCULAR | Status: DC | PRN

## 2021-05-09 MED ORDER — ONDANSETRON HCL 4 MG/2ML IJ SOLN
INTRAMUSCULAR | Status: DC | PRN
  Administered 2021-05-09: 4 mg via INTRAVENOUS

## 2021-05-09 MED ORDER — APIXABAN 2.5 MG PO TABS
2.5000 mg | ORAL_TABLET | Freq: Two times a day (BID) | ORAL | Status: DC
  Administered 2021-05-10: 2.5 mg via ORAL
  Filled 2021-05-09: qty 1

## 2021-05-09 MED ORDER — DIPHENHYDRAMINE HCL 12.5 MG/5ML PO ELIX: 12.5000 mg | ORAL_SOLUTION | ORAL | Status: DC | PRN

## 2021-05-09 MED ORDER — HYDROCODONE-ACETAMINOPHEN 5-325 MG PO TABS
1.0000 | ORAL_TABLET | ORAL | Status: DC | PRN
  Administered 2021-05-09 – 2021-05-10 (×2): 2 via ORAL
  Filled 2021-05-09 (×3): qty 2

## 2021-05-09 MED ORDER — CHLORHEXIDINE GLUCONATE 0.12 % MT SOLN
OROMUCOSAL | Status: AC
  Administered 2021-05-09: 15 mL
  Filled 2021-05-09: qty 15

## 2021-05-09 MED ORDER — CEFAZOLIN SODIUM-DEXTROSE 2-4 GM/100ML-% IV SOLN
2.0000 g | Freq: Four times a day (QID) | INTRAVENOUS | Status: AC
  Administered 2021-05-10 (×2): 2 g via INTRAVENOUS
  Filled 2021-05-09 (×3): qty 100

## 2021-05-09 MED ORDER — ROCURONIUM BROMIDE 100 MG/10ML IV SOLN
INTRAVENOUS | Status: DC | PRN
  Administered 2021-05-09: 60 mg via INTRAVENOUS

## 2021-05-09 MED ORDER — LACTATED RINGERS IV SOLN: INTRAVENOUS | Status: DC | PRN

## 2021-05-09 MED ORDER — FENTANYL CITRATE (PF) 100 MCG/2ML IJ SOLN
25.0000 ug | INTRAMUSCULAR | Status: DC | PRN
  Administered 2021-05-09 (×2): 25 ug via INTRAVENOUS

## 2021-05-09 MED ORDER — ONDANSETRON HCL 4 MG/2ML IJ SOLN: 4.0000 mg | Freq: Four times a day (QID) | INTRAMUSCULAR | Status: DC | PRN

## 2021-05-09 MED ORDER — DEXAMETHASONE SODIUM PHOSPHATE 10 MG/ML IJ SOLN
10.0000 mg | Freq: Once | INTRAMUSCULAR | Status: AC
  Administered 2021-05-10: 10 mg via INTRAVENOUS
  Filled 2021-05-09: qty 1

## 2021-05-09 MED ORDER — LIDOCAINE 2% (20 MG/ML) 5 ML SYRINGE
INTRAMUSCULAR | Status: DC | PRN
  Administered 2021-05-09: 60 mg via INTRAVENOUS

## 2021-05-09 MED ORDER — INSULIN GLARGINE 100 UNIT/ML ~~LOC~~ SOLN: 10.0000 [IU] | Freq: Once | SUBCUTANEOUS | Status: DC

## 2021-05-09 MED ORDER — PROPOFOL 10 MG/ML IV BOLUS
INTRAVENOUS | Status: DC | PRN
  Administered 2021-05-09: 100 mg via INTRAVENOUS
  Administered 2021-05-09: 10 mg via INTRAVENOUS

## 2021-05-09 MED ORDER — CEFAZOLIN SODIUM-DEXTROSE 2-4 GM/100ML-% IV SOLN
2.0000 g | INTRAVENOUS | Status: AC
  Administered 2021-05-09: 2 g via INTRAVENOUS
  Filled 2021-05-09: qty 100

## 2021-05-09 MED ORDER — ROPINIROLE HCL ER 4 MG PO TB24
4.0000 mg | ORAL_TABLET | Freq: Every morning | ORAL | Status: DC
  Administered 2021-05-09 – 2021-05-12 (×4): 4 mg via ORAL
  Filled 2021-05-09 (×4): qty 1

## 2021-05-09 MED ORDER — ONDANSETRON HCL 4 MG/2ML IJ SOLN
INTRAMUSCULAR | Status: AC
  Filled 2021-05-09: qty 2

## 2021-05-09 MED ORDER — CHLORHEXIDINE GLUCONATE 4 % EX LIQD: 60.0000 mL | Freq: Once | CUTANEOUS | Status: DC

## 2021-05-09 MED ORDER — FENTANYL CITRATE (PF) 100 MCG/2ML IJ SOLN
INTRAMUSCULAR | Status: AC
  Filled 2021-05-09: qty 2

## 2021-05-09 MED ORDER — DEXTROSE 50 % IV SOLN
INTRAVENOUS | Status: AC
  Administered 2021-05-09: 25 mL
  Filled 2021-05-09: qty 50

## 2021-05-09 MED ORDER — TRANEXAMIC ACID-NACL 1000-0.7 MG/100ML-% IV SOLN
1000.0000 mg | INTRAVENOUS | Status: AC
  Administered 2021-05-09: 1000 mg via INTRAVENOUS
  Filled 2021-05-09: qty 100

## 2021-05-09 MED ORDER — MENTHOL 3 MG MT LOZG: 1.0000 | LOZENGE | OROMUCOSAL | Status: DC | PRN

## 2021-05-09 MED ORDER — POVIDONE-IODINE 10 % EX SWAB: 2.0000 "application " | Freq: Once | CUTANEOUS | Status: DC

## 2021-05-09 MED ORDER — INSULIN GLARGINE 100 UNIT/ML ~~LOC~~ SOLN
25.0000 [IU] | Freq: Every day | SUBCUTANEOUS | Status: DC
  Administered 2021-05-10: 25 [IU] via SUBCUTANEOUS
  Filled 2021-05-09 (×2): qty 0.25

## 2021-05-09 MED ORDER — POVIDONE-IODINE 10 % EX SWAB
2.0000 "application " | Freq: Once | CUTANEOUS | Status: AC
  Administered 2021-05-09: 2 via TOPICAL

## 2021-05-09 MED ORDER — SODIUM CHLORIDE 0.9 % IV SOLN
INTRAVENOUS | Status: DC
  Administered 2021-05-10: 400 mL via INTRAVENOUS

## 2021-05-09 MED ORDER — SODIUM CHLORIDE 0.9 % IV SOLN: INTRAVENOUS | Status: DC

## 2021-05-09 MED ORDER — TRANEXAMIC ACID-NACL 1000-0.7 MG/100ML-% IV SOLN
1000.0000 mg | Freq: Once | INTRAVENOUS | Status: AC
  Administered 2021-05-09: 1000 mg via INTRAVENOUS
  Filled 2021-05-09: qty 100

## 2021-05-09 MED ORDER — ACETAMINOPHEN 500 MG PO TABS: 1000.0000 mg | ORAL_TABLET | Freq: Once | ORAL | Status: AC

## 2021-05-09 MED ORDER — METHOCARBAMOL 1000 MG/10ML IJ SOLN
500.0000 mg | Freq: Four times a day (QID) | INTRAVENOUS | Status: DC | PRN
  Filled 2021-05-09: qty 5

## 2021-05-09 MED ORDER — METHOCARBAMOL 500 MG PO TABS
500.0000 mg | ORAL_TABLET | Freq: Four times a day (QID) | ORAL | Status: DC | PRN
  Administered 2021-05-09: 500 mg via ORAL
  Filled 2021-05-09: qty 1

## 2021-05-09 MED ORDER — FERROUS SULFATE 325 (65 FE) MG PO TABS
325.0000 mg | ORAL_TABLET | Freq: Three times a day (TID) | ORAL | Status: DC
  Administered 2021-05-11 – 2021-05-12 (×4): 325 mg via ORAL
  Filled 2021-05-09 (×6): qty 1

## 2021-05-09 SURGICAL SUPPLY — 68 items
ADH SKN CLS APL DERMABOND .7 (GAUZE/BANDAGES/DRESSINGS) ×1
ARTICULEZE HEAD (Hips) ×2 IMPLANT
BLADE CLIPPER SURG (BLADE) IMPLANT
BLADE SAW SGTL 18X1.27X75 (BLADE) ×2 IMPLANT
CELLS DAT CNTRL 66122 CELL SVR (MISCELLANEOUS) ×1 IMPLANT
COVER BACK TABLE 24X17X13 BIG (DRAPES) IMPLANT
COVER SURGICAL LIGHT HANDLE (MISCELLANEOUS) ×2 IMPLANT
COVER WAND RF STERILE (DRAPES) ×2 IMPLANT
CUP ACETBLR 52 OD PINNACLE (Hips) ×2 IMPLANT
DERMABOND ADVANCED (GAUZE/BANDAGES/DRESSINGS) ×1
DERMABOND ADVANCED .7 DNX12 (GAUZE/BANDAGES/DRESSINGS) ×1 IMPLANT
DRAPE C-ARM 42X72 X-RAY (DRAPES) ×2 IMPLANT
DRAPE IMP U-DRAPE 54X76 (DRAPES) ×2 IMPLANT
DRAPE STERI IOBAN 125X83 (DRAPES) ×2 IMPLANT
DRAPE U-SHAPE 47X51 STRL (DRAPES) ×6 IMPLANT
DRESSING AQUACEL AG SP 3.5X10 (GAUZE/BANDAGES/DRESSINGS) ×1 IMPLANT
DRSG AQUACEL AG ADV 3.5X10 (GAUZE/BANDAGES/DRESSINGS) IMPLANT
DRSG AQUACEL AG SP 3.5X10 (GAUZE/BANDAGES/DRESSINGS) ×2
DRSG TEGADERM 4X4.75 (GAUZE/BANDAGES/DRESSINGS) IMPLANT
DURAPREP 26ML APPLICATOR (WOUND CARE) ×2 IMPLANT
ELECT BLADE TIP CTD 4 INCH (ELECTRODE) IMPLANT
ELECT REM PT RETURN 9FT ADLT (ELECTROSURGICAL) ×2
ELECTRODE REM PT RTRN 9FT ADLT (ELECTROSURGICAL) ×1 IMPLANT
ELIMINATOR HOLE APEX DEPUY (Hips) ×2 IMPLANT
EVACUATOR 1/8 PVC DRAIN (DRAIN) IMPLANT
FACESHIELD WRAPAROUND (MASK) ×2 IMPLANT
GAUZE SPONGE 2X2 8PLY STRL LF (GAUZE/BANDAGES/DRESSINGS) IMPLANT
GLOVE BIOGEL PI IND STRL 7.5 (GLOVE) ×1 IMPLANT
GLOVE BIOGEL PI INDICATOR 7.5 (GLOVE) ×1
GLOVE ECLIPSE 8.0 STRL XLNG CF (GLOVE) ×4 IMPLANT
GLOVE ORTHO TXT STRL SZ7.5 (GLOVE) ×2 IMPLANT
GLOVE SRG 8 PF TXTR STRL LF DI (GLOVE) ×1 IMPLANT
GLOVE SURG UNDER POLY LF SZ8 (GLOVE) ×2
GOWN BRE IMP PREV XXLGXLNG (GOWN DISPOSABLE) ×2 IMPLANT
GOWN STRL REIN 3XL XLG LVL4 (GOWN DISPOSABLE) ×2 IMPLANT
GOWN STRL REUS W/ TWL LRG LVL3 (GOWN DISPOSABLE) ×2 IMPLANT
GOWN STRL REUS W/TWL LRG LVL3 (GOWN DISPOSABLE) ×4
HEAD ARTICULEZE (Hips) ×1 IMPLANT
KIT BASIN OR (CUSTOM PROCEDURE TRAY) ×2 IMPLANT
KIT TURNOVER KIT B (KITS) ×2 IMPLANT
LINER NEUTRAL 52X36MM PLUS 4 (Liner) ×2 IMPLANT
MANIFOLD NEPTUNE II (INSTRUMENTS) ×2 IMPLANT
NS IRRIG 1000ML POUR BTL (IV SOLUTION) ×2 IMPLANT
PACK TOTAL JOINT (CUSTOM PROCEDURE TRAY) ×2 IMPLANT
PACK UNIVERSAL I (CUSTOM PROCEDURE TRAY) ×2 IMPLANT
PAD ARMBOARD 7.5X6 YLW CONV (MISCELLANEOUS) ×4 IMPLANT
RESTRAINT LIMB HOLDER UNIV (RESTRAINTS) ×2 IMPLANT
RTRCTR WOUND ALEXIS 18CM MED (MISCELLANEOUS) ×2
SCREW 6.5MMX30MM (Screw) ×2 IMPLANT
SPONGE GAUZE 2X2 STER 10/PKG (GAUZE/BANDAGES/DRESSINGS)
SPONGE LAP 4X18 RFD (DISPOSABLE) IMPLANT
STAPLER VISISTAT 35W (STAPLE) ×2 IMPLANT
STEM FEM ACTIS STD SZ7 (Nail) ×2 IMPLANT
SUCTION FRAZIER HANDLE 10FR (MISCELLANEOUS) ×2
SUCTION TUBE FRAZIER 10FR DISP (MISCELLANEOUS) ×1 IMPLANT
SUT MNCRL AB 4-0 PS2 18 (SUTURE) ×2 IMPLANT
SUT VIC AB 1 CT1 27 (SUTURE) ×2
SUT VIC AB 1 CT1 27XBRD ANBCTR (SUTURE) ×1 IMPLANT
SUT VIC AB 2-0 CT1 27 (SUTURE) ×4
SUT VIC AB 2-0 CT1 TAPERPNT 27 (SUTURE) ×2 IMPLANT
SUT VLOC 180 0 24IN GS25 (SUTURE) ×4 IMPLANT
SUT VLOC 180 0 6IN GS21 (SUTURE) ×2 IMPLANT
TOWEL GREEN STERILE (TOWEL DISPOSABLE) ×2 IMPLANT
TOWEL GREEN STERILE FF (TOWEL DISPOSABLE) ×2 IMPLANT
TRAY CATH 16FR W/PLASTIC CATH (SET/KITS/TRAYS/PACK) IMPLANT
TRAY FOLEY MTR SLVR 16FR STAT (SET/KITS/TRAYS/PACK) IMPLANT
WATER STERILE IRR 1000ML POUR (IV SOLUTION) ×2 IMPLANT
YANKAUER SUCT BULB TIP NO VENT (SUCTIONS) ×2 IMPLANT

## 2021-05-09 NOTE — Anesthesia Preprocedure Evaluation (Addendum)
Anesthesia Evaluation  Patient identified by MRN, date of birth, ID band Patient awake    Reviewed: Allergy & Precautions, Patient's Chart, lab work & pertinent test results  History of Anesthesia Complications Negative for: history of anesthetic complications  Airway Mallampati: III  TM Distance: >3 FB Neck ROM: Full    Dental  (+) Dental Advisory Given   Pulmonary sleep apnea ,    breath sounds clear to auscultation       Cardiovascular + Peripheral Vascular Disease  + dysrhythmias Atrial Fibrillation  Rhythm:Regular     Neuro/Psych negative neurological ROS     GI/Hepatic Neg liver ROS, GERD  ,  Endo/Other  diabetes  Renal/GU Renal InsufficiencyRenal disease     Musculoskeletal negative musculoskeletal ROS (+)   Abdominal   Peds  Hematology negative hematology ROS (+)   Anesthesia Other Findings   Reproductive/Obstetrics                            Anesthesia Physical Anesthesia Plan  ASA: II  Anesthesia Plan: General   Post-op Pain Management:    Induction: Intravenous  PONV Risk Score and Plan: 4 or greater  Airway Management Planned: Oral ETT  Additional Equipment:   Intra-op Plan:   Post-operative Plan: Extubation in OR  Informed Consent:   Plan Discussed with: Anesthesiologist  Anesthesia Plan Comments:         Anesthesia Quick Evaluation

## 2021-05-09 NOTE — Discharge Instructions (Signed)

## 2021-05-09 NOTE — Transfer of Care (Signed)
Immediate Anesthesia Transfer of Care Note  Patient: Heather [name]  Procedure(s) Performed: TOTAL HIP ARTHROPLASTY ANTERIOR APPROACH (Right Hip)  Patient Location: PACU  Anesthesia Type:General  Level of Consciousness: Awake confused    Airway & Oxygen Therapy: Patient Spontanous Breathing  Post-op Assessment: Report given to RN and Post -op Vital signs reviewed and stable  Post vital signs: Reviewed and stable  Last Vitals:  Vitals Value Taken Time  BP 159/62 05/09/21 1952  Temp    Pulse 59 05/09/21 1953  Resp 14 05/09/21 1953  SpO2 91 % 05/09/21 1953  Vitals shown include unvalidated device data.  Last Pain:  Vitals:   05/09/21 1533  TempSrc: Oral  PainSc: 3       Patients Stated Pain Goal: 3 (95/70/22 0266)  Complications: No complications documented.

## 2021-05-09 NOTE — Progress Notes (Signed)
Went and saw patient tonight post-op.  Complaining of pain but had just recently received Vicodin.  Also complaining of restless leg and that requip is lower dose than home. - Add on 2 mg Requip to achieve home dose  Delora Fuel, MD 05/09/2021, 10:55 PM PGY-1, [place] Medicine Service pager [phone]

## 2021-05-09 NOTE — Op Note (Signed)
NAME:  [name]                ACCOUNT NO.: [id number]      MEDICAL RECORD NO.: [id number]      FACILITY:  [facility]      PHYSICIAN:  Mauri Pole  DATE OF BIRTH:  1946/02/07     DATE OF PROCEDURE:  05/09/2021                                 OPERATIVE REPORT         PREOPERATIVE DIAGNOSIS: Right hip femoral neck fracture.      POSTOPERATIVE DIAGNOSIS:  Right hip femoral neck fracture.      PROCEDURE:  Right total hip replacement through an anterior approach   utilizing DePuy THR system, component size 52 mm pinnacle cup, a size 36+4 neutral   Altrex liner, a size 7 standard Actis stem with a 36+5 Articuleze metal head ball.      SURGEON:  Pietro Cassis. Alvan Dame, M.D.      ASSISTANT:  Costella Hatcher, PA-C     ANESTHESIA:  General.      SPECIMENS:  None.      COMPLICATIONS:  None.      BLOOD LOSS:  600 cc     DRAINS:  None.      INDICATION OF THE PROCEDURE:  Heather [name] is a 75 y.o. female who    presented to the ER after a ground level fall.  Radiographs revealed a right femoral neck fracture.  I reviewed with her treatment options and given her history of a successful left total hip replacement we felt that would be her best option.  Consent was obtained for   benefit of pain relief.  Specific risks of infection, DVT, component   failure, dislocation, neurovascular injury, and need for revision surgery were reviewed.     PROCEDURE IN DETAIL:  The patient was brought to operative theater.   Once adequate anesthesia, preoperative antibiotics, 2 gm Ancef, 1 gm of Tranexamic Acid, and 10 mg of Decadron were administered, the patient was positioned supine on the [employer] table.  Once the patient was safely positioned with adequate padding of boney prominences we predraped out the hip, and used fluoroscopy to confirm orientation of the pelvis.      The right hip was then prepped and draped from proximal iliac crest to   mid thigh with a shower curtain  technique.      Time-out was performed identifying the patient, planned procedure, and the appropriate extremity.     An incision was then made 2 cm lateral to the   anterior superior iliac spine extending over the orientation of the   tensor fascia lata muscle and sharp dissection was carried down to the   fascia of the muscle.      The fascia was then incised.  The muscle belly was identified and swept   laterally and retractor placed along the superior neck.  Following   cauterization of the circumflex vessels and removing some pericapsular   fat, a second cobra retractor was placed on the inferior neck.  A T-capsulotomy was made along the line of the   superior neck to the trochanteric fossa, then extended proximally and   distally.  Fracture hematoma identified and irrigated. Tag sutures were placed and the retractors were then placed   intracapsular.  We then  identified the trochanteric fossa and   orientation of my neck cut and then made a neck osteotomy with the femur on traction.  The femoral neck fracture and remaining femoral head were removed without difficulty or complication.  Traction was let   off and retractors were placed posterior and anterior around the   acetabulum.      The labrum and foveal tissue were debrided.  I began reaming with a 44 mm   reamer and reamed up to 51 mm reamer with good bony bed preparation and a 52 mm  cup was chosen.  The final 52 mm Pinnacle cup was then impacted under fluoroscopy to confirm the depth of penetration and orientation with respect to   Abduction and forward flexion.  A screw was placed into the ilium followed by the hole eliminator.  The final   36+4 neutral Altrex liner was impacted with good visualized rim fit.  The cup was positioned anatomically within the acetabular portion of the pelvis.      At this point, the femur was rolled to 100 degrees.  Further capsule was   released off the inferior aspect of the femoral neck.  I  then   released the superior capsule proximally.  With the leg in a neutral position the hook was placed laterally   along the femur under the vastus lateralis origin and elevated manually and then held in position using the hook attachment on the bed.  The leg was then extended and adducted with the leg rolled to 100   degrees of external rotation.  Retractors were placed along the medial calcar and posteriorly over the greater trochanter.  Once the proximal femur was fully   exposed, I used a box osteotome to set orientation.  I then began   broaching with the starting chili pepper broach and passed this by hand and then broached up to 7.  With the 7 broach in place I chose a standard offset neck and did several trial reductions.  The offset was appropriate, leg lengths   appeared to be equal best matched with the +5 head ball trial confirmed radiographically.   Given these findings, I went ahead and dislocated the hip, repositioned all   retractors and positioned the right hip in the extended and abducted position.  The final 7 standard Actis stem was   chosen and it was impacted down to the level of neck cut.  Based on this   and the trial reductions, a final 36+5 Articuleze metal head ball was chosen and   impacted onto a clean and dry trunnion, and the hip was reduced.  The   hip had been irrigated throughout the case again at this point.  I did   reapproximate the superior capsular leaflet to the anterior leaflet   using #1 Vicryl.  The fascia of the   tensor fascia lata muscle was then reapproximated using #1 Vicryl and #0 Stratafix sutures.  The   remaining wound was closed with 2-0 Vicryl and running 4-0 Monocryl.   The hip was cleaned, dried, and dressed sterilely using Dermabond and   Aquacel dressing.  The patient was then brought   to recovery room in stable condition tolerating the procedure well.    Costella Hatcher, PA-C was present for the entirety of the case involved from    preoperative positioning, perioperative retractor management, general   facilitation of the case, as well as primary wound closure as assistant.  Pietro Cassis Alvan Dame, M.D.        05/09/2021 7:24 PM

## 2021-05-09 NOTE — Hospital Course (Addendum)
Heather [name] is a 75 y.o. female presenting with right subcapital femoral neck fracture. PMH is significant for bilateral knee replacements (2006 & 2007) and left hip replacement (2009), A-fib on anticoagulation, CKD stage 3b, T2DM, HLD and RLS.  R subcapital femoral neck fracture  Patient presented with right hip pain after fall and was found to have nondisplaced, non comminuted subcapital fracture of the right femoral neck. Patient was followed by Orthopedic Surgery who performed hip replacement surgery on 5/31. Her pain was well controlled post-operatively with Norco and she progressed quickly with physical therapy.   DM2 CBG monitored frequently and patient was on 50U Lantus, and sliding scale insulin.   Atrial fibrillation  HR 80. Rate controlled with Metoprolol. Her home elqiuis was restarted  24 hrs after her hip replacement.    CKD stage IIIb Cr on admission 1.93 close to baseline. Kidney function was monitored closely. Nephrotoxic agents avoided.    Transaminitis On admission AST 325, ALT 187 with improvement by time of discharge. Potentially due to statin use, so statin was held. RUQ [place] was unremarkable.  Hx Falls and osteoporosis S/p bilateral knee replacements and L hip replacement. Vit D normal at 40. She was recommended to start calcium and Vit D post op, as well as bisphosphonate therapy OP    Follow up items Bisphosphonate therapy OP  Restart statin at 10mg  daily

## 2021-05-09 NOTE — Anesthesia Procedure Notes (Signed)
Procedure Name: Intubation Date/Time: 05/09/2021 6:08 PM Performed by: Moshe Salisbury, CRNA Pre-anesthesia Checklist: Patient identified, Emergency Drugs available, Suction available and Patient being monitored Patient Re-evaluated:Patient Re-evaluated prior to induction Oxygen Delivery Method: Circle System Utilized Preoxygenation: Pre-oxygenation with 100% oxygen Induction Type: IV induction Ventilation: Mask ventilation without difficulty Laryngoscope Size: Mac and 3 Grade View: Grade II Tube type: Oral Tube size: 7.5 mm Number of attempts: 1 Airway Equipment and Method: Stylet Placement Confirmation: ETT inserted through vocal cords under direct vision,  positive ETCO2 and breath sounds checked- equal and bilateral Secured at: 21 cm Tube secured with: Tape Dental Injury: Teeth and Oropharynx as per pre-operative assessment

## 2021-05-09 NOTE — TOC CAGE-AID Note (Signed)
Transition of Care [facility]) - CAGE-AID Screening   Patient Details  Name: Heather [name] MRN: [id number] Date of Birth: [birth year]  Clinical Narrative:  Patient denies any alcohol or drug use, no need for substance abuse education at this time.  CAGE-AID Screening:    Have You Ever Felt You Ought to Cut Down on Your Drinking or Drug Use?: No Have People Annoyed You By Critizing Your Drinking Or Drug Use?: No Have You Felt Bad Or Guilty About Your Drinking Or Drug Use?: No Have You Ever Had a Drink or Used Drugs First Thing In The Morning to Steady Your Nerves or to Get Rid of a Hangover?: No CAGE-AID Score: 0  Substance Abuse Education Offered: No

## 2021-05-09 NOTE — Progress Notes (Signed)
Patient ID: Heather [name], female   DOB: 04/02/1946, 75 y.o.   MRN: [id number] Subjective: Stable and ready to proceed    Patient reports pain as moderate.  Objective:   VITALS:   Vitals:   05/08/21 1338 05/09/21 0451  BP: 103/63 137/72  Pulse: (!) 56 80  Resp: 16 17  Temp: 98.4 F (36.9 C) 99.8 F (37.7 C)  SpO2: (!) 87% 92%    Neurovascular intact  LABS Recent Labs    05/07/21 1517 05/08/21 0013 05/09/21 0631  HGB 15.4* 14.7 13.4  HCT 46.6* 45.4 40.5  WBC 8.0 8.3 5.6  PLT 178 147* 131*    Recent Labs    05/07/21 1517 05/08/21 0013 05/09/21 0631  NA 138 135 136  K 4.3 4.6 4.5  BUN 40* 40* 37*  CREATININE 1.93* 1.79* 1.88*  GLUCOSE 237* 250* 104*    No results for input(s): LABPT, INR in the last 72 hours.   Assessment/Plan: 1. Right hip femoral neck fracture   Plan: To OR today for right THR to treat right hip femoral neck fracture NPO Consent ordered

## 2021-05-09 NOTE — Progress Notes (Signed)
Family Medicine Teaching Service Daily Progress Note Intern Pager: [phone]  Patient name: Heather [name] Medical record number: [id number] Date of birth: [birth year] Age: 75 y.o. Gender: female  Primary Care Provider: Greig Right, MD Consultants: Ortho Code Status: DNR   Pt Overview and Major Events to Date:  5/29 Admitted  Assessment and Plan: Cozy Veale is a 75 y.o. female presenting with right subcapital femoral neck fracture. PMH is significant for bilateral knee replacements (2006 & 2007) and left hip replacement (2009), A-fib on anticoagulation, CKD stage 3b, T2DM, HLD and RLS.  R subcapital femoral neck fracture  Hip replacement surgery scheduled this afternoon - Ortho following, appreciate recommendations - Dialudid 0.5-1mg  q4h prn ( required 4mg  over last 24h)  - incentive spirometer - miralax and senna BID - NPO  Atrial fibrillation  HR 80. Rate controlled with Metoprolol.  Home medication: Eliquis 5mg  BID.  - Hold elquis for procedure. Restart 24-72 hr after  - Metoprolol tartrate 25mg  BID   AKI on CKD stage IIIb Cr 1.88 <1.79<1.93 - continue to monitor  - am CMP  - avoid nephrotoxins  - consider adding gentle fluids after surgery  DM2 CBG 105 this am. Last 24 h required 25U Lantus, 3 u aspart  - hold Lantus - consider continuing this evening   Transaminitis AST 116, ALT 160 downtrending from yesterday. Potentially due to statin use  - RUQ [place] tomorrow - if elevates d/c statin   Thrombocytopenia  Platelets 131  - continue to monitor  - am CBC  Hx OSA Untreated. Cannot tolerate CPAP - monitor respiratory status   Hx Falls and osteoporosis S/p bilateral knee replacements and L hip replacement Vit D normal at 40 - start calcium and Vit D post op - bisphosphonate therapy OP   RLS Requip 4mg    Insomnia Ambien 5mg  nightly   TOC consult for rehab facility after procedure   FEN/GI: NPO  PPx: SCDs   Disposition: Med-surg   Subjective:   No acute events overnight. Patient anxious about upcoming surgery. States her husband is not able to come because he is obese and has a hard time getting around. She is interested in rehab after hospitalization since it would be tough on him to care for her at home.   Objective: Temp:  [98.4 F (36.9 C)-99.8 F (37.7 C)] 99.8 F (37.7 C) (05/31 0451) Pulse Rate:  [56-80] 80 (05/31 0451) Resp:  [16-17] 17 (05/31 0451) BP: (103-137)/(63-77) 137/72 (05/31 0451) SpO2:  [87 %-96 %] 92 % (05/31 0451) Physical Exam: General: alert, pleasant, NAD Cardiovascular: RRR no murmurs Respiratory: CTAB normal WOB  Abdomen: soft, non distended, non tender  Extremities: warm, dry. No LE edema. R LE tender to light palpation. Able to move spontaneously   Laboratory: Recent Labs  Lab 05/07/21 1517 05/08/21 0013 05/09/21 0631  WBC 8.0 8.3 5.6  HGB 15.4* 14.7 13.4  HCT 46.6* 45.4 40.5  PLT 178 147* 131*   Recent Labs  Lab 05/07/21 1517 05/08/21 0013 05/09/21 0631  NA 138 135 136  K 4.3 4.6 4.5  CL 102 103 103  CO2 22 23 25   BUN 40* 40* 37*  CREATININE 1.93* 1.79* 1.88*  CALCIUM 8.8* 8.4* 8.4*  PROT  --  6.3* 6.0*  BILITOT  --  1.1 2.1*  ALKPHOS  --  52 60  ALT  --  187* 160*  AST  --  325* 116*  GLUCOSE 237* 250* 104*     Imaging/Diagnostic Tests: None new  Clancy Leiner,  Weldon Picking, DO 05/09/2021, 9:30 AM PGY-1, [place] Intern pager: [phone], text pages welcome

## 2021-05-10 ENCOUNTER — Inpatient Hospital Stay (HOSPITAL_COMMUNITY): Payer: PPO

## 2021-05-10 LAB — COMPREHENSIVE METABOLIC PANEL
ALT: 114 U/L — ABNORMAL HIGH (ref 0–44)
AST: 76 U/L — ABNORMAL HIGH (ref 15–41)
Albumin: 2.7 g/dL — ABNORMAL LOW (ref 3.5–5.0)
Alkaline Phosphatase: 63 U/L (ref 38–126)
Anion gap: 13 (ref 5–15)
BUN: 37 mg/dL — ABNORMAL HIGH (ref 8–23)
CO2: 21 mmol/L — ABNORMAL LOW (ref 22–32)
Calcium: 8.1 mg/dL — ABNORMAL LOW (ref 8.9–10.3)
Chloride: 98 mmol/L (ref 98–111)
Creatinine, Ser: 1.78 mg/dL — ABNORMAL HIGH (ref 0.44–1.00)
GFR, Estimated: 29 mL/min — ABNORMAL LOW (ref >60)
Glucose, Bld: 161 mg/dL — ABNORMAL HIGH (ref 70–99)
Potassium: 5.4 mmol/L — ABNORMAL HIGH (ref 3.5–5.1)
Sodium: 132 mmol/L — ABNORMAL LOW (ref 135–145)
Total Bilirubin: 2 mg/dL — ABNORMAL HIGH (ref 0.3–1.2)
Total Protein: 5.8 g/dL — ABNORMAL LOW (ref 6.5–8.1)

## 2021-05-10 LAB — GLUCOSE, CAPILLARY
Glucose-Capillary: 178 mg/dL — ABNORMAL HIGH (ref 70–99)
Glucose-Capillary: 269 mg/dL — ABNORMAL HIGH (ref 70–99)
Glucose-Capillary: 364 mg/dL — ABNORMAL HIGH (ref 70–99)
Glucose-Capillary: 353 mg/dL — ABNORMAL HIGH (ref 70–99)

## 2021-05-10 LAB — BASIC METABOLIC PANEL WITH GFR
Anion gap: 14 (ref 5–15)
BUN: 44 mg/dL — ABNORMAL HIGH (ref 8–23)
CO2: 20 mmol/L — ABNORMAL LOW (ref 22–32)
Calcium: 7.9 mg/dL — ABNORMAL LOW (ref 8.9–10.3)
Chloride: 99 mmol/L (ref 98–111)
Creatinine, Ser: 2.17 mg/dL — ABNORMAL HIGH (ref 0.44–1.00)
GFR, Estimated: 23 mL/min — ABNORMAL LOW
Glucose, Bld: 354 mg/dL — ABNORMAL HIGH (ref 70–99)
Potassium: 4.6 mmol/L (ref 3.5–5.1)
Sodium: 133 mmol/L — ABNORMAL LOW (ref 135–145)

## 2021-05-10 LAB — CBC
HCT: 36.5 % (ref 36.0–46.0)
Hemoglobin: 12 g/dL (ref 12.0–15.0)
MCH: 30.7 pg (ref 26.0–34.0)
MCHC: 32.9 g/dL (ref 30.0–36.0)
MCV: 93.4 fL (ref 80.0–100.0)
Platelets: 133 10*3/uL — ABNORMAL LOW (ref 150–400)
RBC: 3.91 MIL/uL (ref 3.87–5.11)
RDW: 11.9 % (ref 11.5–15.5)
WBC: 7.1 10*3/uL (ref 4.0–10.5)
nRBC: 0 % (ref 0.0–0.2)

## 2021-05-10 MED ORDER — ACETAMINOPHEN 325 MG PO TABS
650.0000 mg | ORAL_TABLET | Freq: Two times a day (BID) | ORAL | Status: DC
  Administered 2021-05-10 – 2021-05-12 (×4): 650 mg via ORAL
  Filled 2021-05-10 (×4): qty 2

## 2021-05-10 MED ORDER — LORATADINE 10 MG PO TABS
10.0000 mg | ORAL_TABLET | Freq: Every day | ORAL | Status: DC
  Administered 2021-05-10 – 2021-05-12 (×3): 10 mg via ORAL
  Filled 2021-05-10 (×3): qty 1

## 2021-05-10 MED ORDER — POLYETHYLENE GLYCOL 3350 17 G PO PACK
17.0000 g | PACK | Freq: Two times a day (BID) | ORAL | Status: DC
  Administered 2021-05-10 – 2021-05-11 (×4): 17 g via ORAL
  Filled 2021-05-10 (×5): qty 1

## 2021-05-10 MED ORDER — VITAMIN D 25 MCG (1000 UNIT) PO TABS
1000.0000 [IU] | ORAL_TABLET | Freq: Every day | ORAL | Status: DC
  Administered 2021-05-10 – 2021-05-12 (×3): 1000 [IU] via ORAL
  Filled 2021-05-10 (×3): qty 1

## 2021-05-10 MED ORDER — APIXABAN 5 MG PO TABS
5.0000 mg | ORAL_TABLET | Freq: Two times a day (BID) | ORAL | Status: DC
  Administered 2021-05-10 – 2021-05-12 (×4): 5 mg via ORAL
  Filled 2021-05-10 (×4): qty 1

## 2021-05-10 MED ORDER — ROPINIROLE HCL 0.5 MG PO TABS
2.0000 mg | ORAL_TABLET | Freq: Every day | ORAL | Status: DC
  Administered 2021-05-10 – 2021-05-12 (×3): 2 mg via ORAL
  Filled 2021-05-10 (×3): qty 4

## 2021-05-10 MED ORDER — LORATADINE 10 MG PO TABS: 10.0000 mg | ORAL_TABLET | Freq: Every day | ORAL | Status: DC

## 2021-05-10 MED ORDER — SODIUM ZIRCONIUM CYCLOSILICATE 10 G PO PACK
10.0000 g | PACK | Freq: Once | ORAL | Status: AC
  Administered 2021-05-10: 10 g via ORAL
  Filled 2021-05-10: qty 1

## 2021-05-10 MED ORDER — FLUTICASONE PROPIONATE 50 MCG/ACT NA SUSP
1.0000 | Freq: Every day | NASAL | Status: DC
  Administered 2021-05-10 – 2021-05-12 (×3): 1 via NASAL
  Filled 2021-05-10: qty 16

## 2021-05-10 MED ORDER — HYDROCODONE-ACETAMINOPHEN 5-325 MG PO TABS
2.0000 | ORAL_TABLET | ORAL | Status: DC | PRN
  Administered 2021-05-10: 2 via ORAL

## 2021-05-10 MED ORDER — ACETAMINOPHEN 325 MG PO TABS
650.0000 mg | ORAL_TABLET | Freq: Three times a day (TID) | ORAL | Status: DC
  Administered 2021-05-10: 650 mg via ORAL
  Filled 2021-05-10: qty 2

## 2021-05-10 MED ORDER — BACLOFEN 10 MG PO TABS: 5.0000 mg | ORAL_TABLET | Freq: Two times a day (BID) | ORAL | Status: DC | PRN

## 2021-05-10 MED ORDER — CALCIUM CARBONATE 1250 (500 CA) MG PO TABS
1.0000 | ORAL_TABLET | Freq: Every day | ORAL | Status: DC
  Administered 2021-05-10 – 2021-05-12 (×3): 500 mg via ORAL
  Filled 2021-05-10 (×2): qty 1

## 2021-05-10 MED ORDER — HYDROCODONE-ACETAMINOPHEN 5-325 MG PO TABS
1.0000 | ORAL_TABLET | ORAL | Status: DC | PRN
  Administered 2021-05-10 – 2021-05-12 (×2): 1 via ORAL
  Filled 2021-05-10 (×2): qty 1

## 2021-05-10 NOTE — Care Management Important Message (Signed)
Important Message  Patient Details  Name: Heather [name] MRN: [id number] Date of Birth: Jul 10, 1946   Medicare Important Message Given:  Yes     Orbie Pyo 05/10/2021, 3:13 PM

## 2021-05-10 NOTE — Progress Notes (Signed)
   Subjective: 1 Day Post-Op Procedure(s) (LRB): TOTAL HIP ARTHROPLASTY ANTERIOR APPROACH (Right) Patient reports pain as mild.   Patient seen in rounds for Dr. Alvan Dame. Patient is well, and has had no acute complaints or problems other than burning pain in the right hip. No acute events overnight. She primarily complains of restless leg symptoms last night. Voiding without difficulty, positive flatus. Her goal is to discharge home with her husband when she is ready to leave. We will start therapy today.   Objective: Vital signs in last 24 hours: Temp:  [97.8 F (36.6 C)-98.5 F (36.9 C)] 98.2 F (36.8 C) (06/01 0427) Pulse Rate:  [55-76] 55 (06/01 0427) Resp:  [14-19] 18 (06/01 0427) BP: (126-159)/(62-79) 126/66 (06/01 0427) SpO2:  [90 %-98 %] 97 % (06/01 0427)  Intake/Output from previous day:  Intake/Output Summary (Last 24 hours) at 05/10/2021 0839 Last data filed at 05/10/2021 0427 Gross per 24 hour  Intake 1160 ml  Output 1400 ml  Net -240 ml     Intake/Output this shift: No intake/output data recorded.  Labs: Recent Labs    05/07/21 1517 05/08/21 0013 05/09/21 0631  HGB 15.4* 14.7 13.4   Recent Labs    05/08/21 0013 05/09/21 0631  WBC 8.3 5.6  RBC 4.79 4.33  HCT 45.4 40.5  PLT 147* 131*   Recent Labs    05/09/21 0631 05/10/21 0119  NA 136 132*  K 4.5 5.4*  CL 103 98  CO2 25 21*  BUN 37* 37*  CREATININE 1.88* 1.78*  GLUCOSE 104* 161*  CALCIUM 8.4* 8.1*   No results for input(s): LABPT, INR in the last 72 hours.  Exam: General - Patient is Alert and Oriented Extremity - Neurologically intact Sensation intact distally Intact pulses distally Dorsiflexion/Plantar flexion intact Dressing - dressing C/D/I Motor Function - intact, moving foot and toes well on exam.   Past Medical History:  Diagnosis Date  . Atrial fibrillation ([place]) 08/14/2014  . Bell's palsy   . Bimalleolar fracture of left ankle   . Breast cancer ([place])   . Diabetes mellitus,  type II ([place])   . Dysrhythmia    a-fib  . GERD (gastroesophageal reflux disease)   . Low back pain with sciatica   . Osteoarthritis   . Proliferative diabetic retinopathy of both eyes ([place])   . Restless legs 2012  . Sleep apnea    CPAP nightly    Assessment/Plan: 1 Day Post-Op Procedure(s) (LRB): TOTAL HIP ARTHROPLASTY ANTERIOR APPROACH (Right) Active Problems:   Diabetic peripheral neuropathy ([facility])   Morbid obesity ([facility])   Atrial fibrillation ([facility])   Hip fracture ([facility])   Type 2 diabetes mellitus with diabetic chronic kidney disease (H[facility])   S/P right total hip arthroplasty  Estimated body mass index is 31.79 kg/m as calculated from the following:   Height as of 01/19/16: 5\' 7"  (1.702 m).   Weight as of 01/19/16: 92.1 kg. Advance diet Up with therapy  DVT Prophylaxis - Eliquis 2.5 BID today, may resume normal therapeutic dose tomorrow Weight bearing as tolerated.  Plan is to go Home after hospital stay. Plan to get up with PT today. Continue pain management.   Griffith Citron, PA-C Orthopedic Surgery [phone] 05/10/2021, 8:39 AM

## 2021-05-10 NOTE — Progress Notes (Addendum)
Family Medicine Teaching Service Daily Progress Note Intern Pager: [phone]  Patient name: Heather [name] Medical record number: [id number] Date of birth: 1946-05-07 Age: 75 y.o. Gender: female  Primary Care Provider: Greig Right, MD Consultants: Ortho Code Status: DNR   Pt Overview and Major Events to Date:  5/29: admitted 5/31: underwent right total hip arthroplasty  Assessment and Plan: Heather [name] is a 75 y.o. female presenting with right subcapital femoral neck fracture. PMH is significant for bilateral knee replacements (2006 & 2007) and left hip replacement (2009), A-fib on anticoagulation, CKD stage 3b, T2DM, HLD and restless leg syndrome.  R subcapital femoral neck fracture s/p total hip arthroplasty POD #1 s/p right total hip replacement. Pain is moderately well controlled- she endorses pain in the distal right thigh and knee more than the hip. - Ortho following, appreciate recommendations - Norco 5-325, 1 tablet q4h prn for moderate pain, 2 tablets q4h prn for severe pain - Scheduled Tylenol (2g total daily limit due to elevated LFTs) - Incentive spirometry - Miralax daily, Senna BID - PT/OT - f/u CBC from this morning  Headache Patient reports sudden onset worse headache of her life this morning. -STAT Head CT -Hold Eliquis pending results of head CT  Atrial fibrillation  Rate controlled. HR 55 this morning, so Metoprolol was held. Home medication: Eliquis 5mg  BID.  - Holding Eliquis pending results of head CT - If normal head CT, restart home Eliquis tonight - Metoprolol tartrate 25mg  BID   CKD stage IIIb Cr 1.78 this morning, stable from prior. - Continue NS @ 70mL/hr - Daily BMP - Avoid nephrotoxins   DM2 CBG 178 this am. Did not receive any insulin over past 24h. -restart home Lantus 25u daily -sSSI -CBG monitoring   Transaminitis AST 76, ALT 114 downtrending from 325/187 on admission. RUQ ultrasound unremarkable. Potentially due to statin use   -Holding home Rosuvastatin -Continue to monitor -Caution with Acetaminophen (2g daily limit for now)  Hyperkalemia K mildly elevated at 5.4 this morning. Possible that sample was slightly hemolyzed as K has been normal previously.  -s/p Lokelma x1 -Check EKG -Repeat BMP this afternoon  Hx OSA Untreated. Cannot tolerate CPAP - monitor respiratory status   Hx Falls and osteoporosis S/p bilateral knee replacements and L hip replacement Vit D normal at 40 - Will start calcium and Vit D supplementation  Restless Leg Syndrome Home meds: Requip 6mg  daily - Due to formulary options, will give Requip XL 4mg  with Requip IR 2mg  daily  Insomnia Ambien 5mg  nightly    FEN/GI: Regular diet PPx: SCDs, holding Eliquis for now  Disposition: Med-surg   Subjective:  No acute events overnight. Patient reported sudden onset worst headache of her life this morning. See attending attestation for more details. Otherwise reports her hip feels well. Does have some pain in her right knee and right distal thigh.  Objective: Temp:  [97.8 F (36.6 C)-98.5 F (36.9 C)] 98.2 F (36.8 C) (06/01 0427) Pulse Rate:  [55-76] 55 (06/01 0427) Resp:  [14-19] 18 (06/01 0427) BP: (126-159)/(62-79) 126/66 (06/01 0427) SpO2:  [90 %-98 %] 97 % (06/01 0427) Physical Exam: General: alert, pleasant, NAD Cardiovascular: RRR no murmurs Respiratory: CTAB normal WOB  Abdomen: soft, non distended, non tender  Extremities: warm, dry. No LE edema. Dressing c/d/i. Tender to palpation of right distal anterior thigh. Full ROM of R knee.   Laboratory: Recent Labs  Lab 05/07/21 1517 05/08/21 0013 05/09/21 0631  WBC 8.0 8.3 5.6  HGB 15.4* 14.7  13.4  HCT 46.6* 45.4 40.5  PLT 178 147* 131*   Recent Labs  Lab 05/08/21 0013 05/09/21 0631 05/10/21 0119  NA 135 136 132*  K 4.6 4.5 5.4*  CL 103 103 98  CO2 23 25 21*  BUN 40* 37* 37*  CREATININE 1.79* 1.88* 1.78*  CALCIUM 8.4* 8.4* 8.1*  PROT 6.3* 6.0* 5.8*   BILITOT 1.1 2.1* 2.0*  ALKPHOS 52 60 63  ALT 187* 160* 114*  AST 325* 116* 76*  GLUCOSE 250* 104* 161*     Imaging/Diagnostic Tests: No new imaging/diagnostic tests in past 24h.   Alcus Dad, MD 05/10/2021, 9:48 AM PGY-1, [place] Intern pager: [phone], text pages welcome

## 2021-05-10 NOTE — Evaluation (Signed)
Physical Therapy Evaluation Patient Details Name: Heather [name] MRN: [id number] DOB: August 12, 1946 Today's Date: 05/10/2021   History of Present Illness  75 y.o. female admitted on 05/07/21 for fall with R hip fx s/p R direct anterior THA on 05/09/21.  Pt is WBAT post op.  Pt with significant PMH of DM2, restless legs, diabetic retinopathy both eyes, OA, low back pain with sciatica, breast CA, L ankle fx s/p ORIF, a-fib, total hip arthroplasty in 2009, bil TKA.  Clinical Impression  Pt was able to get up OOB and walk a short distance with RW to recliner chair (distance limited by lightheadedness).  Pt was able to complete LE HEP exercises with assistance.  She reports she has a husband at home who can only provide supervision, so we will need to get her to supervision level prior to d/c.  PT will continue to follow acutely for safe mobility progression.   PT to follow acutely for deficits listed below.  I am hopeful for PT to see her in the AM for progression of gait into the hallway and PM for stairs for hopeful d/c tomorrow afternoon.     Follow Up Recommendations Home health PT;Supervision for mobility/OOB    Equipment Recommendations  Rolling walker with 5" wheels    Recommendations for Other Services       Precautions / Restrictions Precautions Precautions: Fall Restrictions RLE Weight Bearing: Weight bearing as tolerated      Mobility  Bed Mobility Overal bed mobility: Modified Independent             General bed mobility comments: Pt using leg loop to move leg over EOB and managing trunk with bed rail and HOB mildly elevated (30 degrees).    Transfers Overall transfer level: Needs assistance Equipment used: Rolling walker (2 wheeled) Transfers: Sit to/from Stand Sit to Stand: Min assist         General transfer comment: Min assist to come to standing EOB.  Min assist to stabilize RW from tipping back  Ambulation/Gait Ambulation/Gait assistance: Min assist Gait  Distance (Feet): 5 Feet Assistive device: Rolling walker (2 wheeled) Gait Pattern/deviations: Step-to pattern;Antalgic     General Gait Details: Pt with moderately antalgic gait pattern, cues for LE sequencing, min assist at trunk and RW for balance and safety.  Limited gait distance due to lightheadedness and feeling "hot".  Sensation passed once sitting.  Stairs            Wheelchair Mobility    Modified Rankin (Stroke Patients Only)       Balance Overall balance assessment: Needs assistance Sitting-balance support: Feet supported;No upper extremity supported Sitting balance-Leahy Scale: Good     Standing balance support: Bilateral upper extremity supported Standing balance-Leahy Scale: Poor Standing balance comment: needs support from therapist and RW                             Pertinent Vitals/Pain Pain Assessment: Faces Faces Pain Scale: Hurts little more Pain Location: right hip Pain Descriptors / Indicators: Grimacing;Guarding Pain Intervention(s): Limited activity within patient's tolerance;Monitored during session;Repositioned;Premedicated before session    Home Living Family/patient expects to be discharged to:: Private residence Living Arrangements: Spouse/significant other Available Help at Discharge: Family;Available 24 hours/day (husband can provide supervision only) Type of Home: House Home Access: Stairs to enter Entrance Stairs-Rails: Left Entrance Stairs-Number of Steps: 3 Home Layout: One level Home Equipment: None      Prior Function Level of  Independence: Independent               Hand Dominance   Dominant Hand: Right    Extremity/Trunk Assessment   Upper Extremity Assessment Upper Extremity Assessment: Overall WFL for tasks assessed    Lower Extremity Assessment Lower Extremity Assessment: RLE deficits/detail RLE Deficits / Details: right leg with normal post op pain and weakness, ankle 3/5, knee 3-/5, hip  2/5    Cervical / Trunk Assessment Cervical / Trunk Assessment: Other exceptions Cervical / Trunk Exceptions: h/o low back issues and sciatica  Communication   Communication: No difficulties  Cognition Arousal/Alertness: Awake/alert Behavior During Therapy: WFL for tasks assessed/performed Overall Cognitive Status: Within Functional Limits for tasks assessed                                        General Comments      Exercises Total Joint Exercises Ankle Circles/Pumps: AROM;Both;20 reps Quad Sets: AROM;Right;10 reps Heel Slides: AAROM;Right;10 reps Long Arc Quad: AROM;Right;10 reps;Seated   Assessment/Plan    PT Assessment Patient needs continued PT services  PT Problem List Decreased strength;Decreased range of motion;Decreased activity tolerance;Decreased balance;Decreased mobility;Decreased knowledge of use of DME;Pain       PT Treatment Interventions DME instruction;Gait training;Stair training;Functional mobility training;Therapeutic activities;Therapeutic exercise;Balance training;Patient/family education;Modalities;Manual techniques    PT Goals (Current goals can be found in the Care Plan section)  Acute Rehab PT Goals Patient Stated Goal: to go home tomorrow afternoon PT Goal Formulation: With patient Time For Goal Achievement: 05/24/21 Potential to Achieve Goals: Good    Frequency 7X/week   Barriers to discharge        Co-evaluation               AM-PAC PT "6 Clicks" Mobility  Outcome Measure Help needed turning from your back to your side while in a flat bed without using bedrails?: A Little Help needed moving from lying on your back to sitting on the side of a flat bed without using bedrails?: A Little Help needed moving to and from a bed to a chair (including a wheelchair)?: A Little Help needed standing up from a chair using your arms (e.g., wheelchair or bedside chair)?: A Little Help needed to walk in hospital room?: A  Little Help needed climbing 3-5 steps with a railing? : A Little 6 Click Score: 18    End of Session Equipment Utilized During Treatment: Gait belt Activity Tolerance: Patient limited by pain Patient left: in chair;with call bell/phone within reach;with chair alarm set   PT Visit Diagnosis: Muscle weakness (generalized) (M62.81);Difficulty in walking, not elsewhere classified (R26.2);Pain Pain - Right/Left: Right Pain - part of body: Hip    Time: 9166-0600 PT Time Calculation (min) (ACUTE ONLY): 32 min   Charges:   PT Evaluation $PT Eval Moderate Complexity: 1 Mod PT Treatments $Gait Training: 8-22 mins        Verdene Lennert, PT, DPT  Acute Rehabilitation Ortho Tech Supervisor [phone] pager [phone]) [phone] office

## 2021-05-10 NOTE — Progress Notes (Signed)
Patient is refusing to have telemetry monitor. Will notify MD.

## 2021-05-10 NOTE — Care Management Important Message (Signed)
Important Message  Patient Details  Name: Heather [name] MRN: [id number] Date of Birth: 07-25-1946   Medicare Important Message Given:  Yes     Orbie Pyo 05/10/2021, 3:12 PM

## 2021-05-11 ENCOUNTER — Encounter (HOSPITAL_COMMUNITY): Payer: Self-pay | Admitting: Orthopedic Surgery

## 2021-05-11 LAB — GLUCOSE, CAPILLARY
Glucose-Capillary: 368 mg/dL — ABNORMAL HIGH (ref 70–99)
Glucose-Capillary: 383 mg/dL — ABNORMAL HIGH (ref 70–99)
Glucose-Capillary: 343 mg/dL — ABNORMAL HIGH (ref 70–99)
Glucose-Capillary: 262 mg/dL — ABNORMAL HIGH (ref 70–99)

## 2021-05-11 LAB — COMPREHENSIVE METABOLIC PANEL
ALT: 44 U/L (ref 0–44)
AST: 30 U/L (ref 15–41)
Albumin: 2.4 g/dL — ABNORMAL LOW (ref 3.5–5.0)
Alkaline Phosphatase: 55 U/L (ref 38–126)
Anion gap: 8 (ref 5–15)
BUN: 51 mg/dL — ABNORMAL HIGH (ref 8–23)
CO2: 25 mmol/L (ref 22–32)
Calcium: 8.3 mg/dL — ABNORMAL LOW (ref 8.9–10.3)
Chloride: 99 mmol/L (ref 98–111)
Creatinine, Ser: 1.9 mg/dL — ABNORMAL HIGH (ref 0.44–1.00)
GFR, Estimated: 27 mL/min — ABNORMAL LOW (ref >60)
Glucose, Bld: 342 mg/dL — ABNORMAL HIGH (ref 70–99)
Potassium: 5.4 mmol/L — ABNORMAL HIGH (ref 3.5–5.1)
Sodium: 132 mmol/L — ABNORMAL LOW (ref 135–145)
Total Bilirubin: 0.8 mg/dL (ref 0.3–1.2)
Total Protein: 5.6 g/dL — ABNORMAL LOW (ref 6.5–8.1)

## 2021-05-11 LAB — CBC
HCT: 34.2 % — ABNORMAL LOW (ref 36.0–46.0)
Hemoglobin: 11.6 g/dL — ABNORMAL LOW (ref 12.0–15.0)
MCH: 31.1 pg (ref 26.0–34.0)
MCHC: 33.9 g/dL (ref 30.0–36.0)
MCV: 91.7 fL (ref 80.0–100.0)
Platelets: 167 10*3/uL (ref 150–400)
RBC: 3.73 MIL/uL — ABNORMAL LOW (ref 3.87–5.11)
RDW: 11.9 % (ref 11.5–15.5)
WBC: 7.7 10*3/uL (ref 4.0–10.5)
nRBC: 0 % (ref 0.0–0.2)

## 2021-05-11 LAB — BASIC METABOLIC PANEL
Anion gap: 11 (ref 5–15)
BUN: 49 mg/dL — ABNORMAL HIGH (ref 8–23)
CO2: 25 mmol/L (ref 22–32)
Calcium: 8.3 mg/dL — ABNORMAL LOW (ref 8.9–10.3)
Chloride: 98 mmol/L (ref 98–111)
Creatinine, Ser: 1.84 mg/dL — ABNORMAL HIGH (ref 0.44–1.00)
GFR, Estimated: 28 mL/min — ABNORMAL LOW (ref >60)
Glucose, Bld: 287 mg/dL — ABNORMAL HIGH (ref 70–99)
Potassium: 4.4 mmol/L (ref 3.5–5.1)
Sodium: 134 mmol/L — ABNORMAL LOW (ref 135–145)

## 2021-05-11 MED ORDER — METOPROLOL SUCCINATE ER 25 MG PO TB24
25.0000 mg | ORAL_TABLET | Freq: Every day | ORAL | Status: DC
  Administered 2021-05-11 – 2021-05-12 (×2): 25 mg via ORAL
  Filled 2021-05-11 (×2): qty 1

## 2021-05-11 MED ORDER — METHOCARBAMOL 500 MG PO TABS: 500.0000 mg | ORAL_TABLET | Freq: Four times a day (QID) | ORAL | 0 refills | Status: DC | PRN

## 2021-05-11 MED ORDER — INSULIN GLARGINE 100 UNIT/ML ~~LOC~~ SOLN
50.0000 [IU] | Freq: Every day | SUBCUTANEOUS | Status: DC
  Administered 2021-05-12: 50 [IU] via SUBCUTANEOUS
  Filled 2021-05-11: qty 0.5

## 2021-05-11 MED ORDER — POLYETHYLENE GLYCOL 3350 17 G PO PACK: 17.0000 g | PACK | Freq: Two times a day (BID) | ORAL | 0 refills | Status: DC

## 2021-05-11 MED ORDER — SODIUM ZIRCONIUM CYCLOSILICATE 10 G PO PACK
10.0000 g | PACK | Freq: Once | ORAL | Status: AC
  Administered 2021-05-11: 10 g via ORAL
  Filled 2021-05-11: qty 1

## 2021-05-11 MED ORDER — INSULIN GLARGINE 100 UNIT/ML ~~LOC~~ SOLN
50.0000 [IU] | Freq: Once | SUBCUTANEOUS | Status: AC
  Administered 2021-05-11: 50 [IU] via SUBCUTANEOUS
  Filled 2021-05-11: qty 0.5

## 2021-05-11 MED ORDER — HYDROCODONE-ACETAMINOPHEN 5-325 MG PO TABS: 1.0000 | ORAL_TABLET | Freq: Four times a day (QID) | ORAL | 0 refills | Status: DC | PRN

## 2021-05-11 MED ORDER — INSULIN GLARGINE 100 UNIT/ML ~~LOC~~ SOLN
25.0000 [IU] | Freq: Once | SUBCUTANEOUS | Status: DC
  Filled 2021-05-11: qty 0.25

## 2021-05-11 NOTE — Progress Notes (Signed)
Physical Therapy Treatment Patient Details Name: Heather [name] MRN: [id number] DOB: 09-19-1946 Today's Date: 05/11/2021    History of Present Illness 75 y.o. female admitted on 05/07/21 for fall with R hip fx s/p R direct anterior THA on 05/09/21.  Pt is WBAT post op.  Pt with significant PMH of DM2, restless legs, diabetic retinopathy both eyes, OA, low back pain with sciatica, breast CA, L ankle fx s/p ORIF, a-fib, total hip arthroplasty in 2009, bil TKA.    PT Comments    The pt was eager to participate in PT session with focus on progression of OOB mobility and stair training. She was able to demo great progress with sit-stand transfers this session, completing x12 with minG, increased time, and use of RW, but not needing physical assist. The pt also was able to complete significantly increased gait (from 49ft last session to 2 bouts of 35ft), but continues to require cues for gait pattern and minG for safety. The pt was educated in 2 methods of stair navigation, and was able to complete with minG-minA and use of single rail to mimic her home environment. [name] continue to benefit from skilled PT acutely and from [place] following d/c to increase safety and endurance for mobility as pt needs to be at supervision level to d/c home with her husband.    Follow Up Recommendations  Follow surgeon's recommendation for DC plan and follow-up therapies;Supervision for mobility/OOB     Equipment Recommendations  Rolling walker with 5" wheels;3in1 (PT)    Recommendations for Other Services       Precautions / Restrictions Precautions Precautions: Fall Restrictions Weight Bearing Restrictions: Yes RLE Weight Bearing: Weight bearing as tolerated    Mobility  Bed Mobility Overal bed mobility: Modified Independent;Needs Assistance Bed Mobility: Supine to Sit;Sit to Supine     Supine to sit: Modified independent (Device/Increase time) Sit to supine: Min assist   General bed mobility comments: modI  with use of sheet on RLE to move it to the EOB, able to push to sit with rail. Pt then needing minA to return BLE to bed due to fatigue    Transfers Overall transfer level: Needs assistance Equipment used: Rolling walker (2 wheeled) Transfers: Sit to/from Stand Sit to Stand: Min guard         General transfer comment: minG with increased time, no assist to power up from EOB or recliner. x12 through session  Ambulation/Gait Ambulation/Gait assistance: Min guard Gait Distance (Feet): 4 Feet (+ 10 ft + 10 ft) Assistive device: Rolling walker (2 wheeled) Gait Pattern/deviations: Step-to pattern;Step-through pattern;Decreased step length - right;Decreased stance time - right;Decreased stride length;Decreased weight shift to right;Antalgic Gait velocity: decreased Gait velocity interpretation: <1.31 ft/sec, indicative of household ambulator General Gait Details: moderately antalgic, decreased hip flexion RLE and decreased wt acceptance. intermittently able to progress to step-through pattern   Stairs Stairs: Yes Stairs assistance: Min assist Stair Management: One rail Right;One rail Left;Step to pattern;Forwards;Sideways Number of Stairs: 2 (x2) General stair comments: pt initially with R rail, significant trunk flexion and forearms on rail, able to complete with verbal cues and without LOB, but minA for steady. practiced x2 on L rail with improved upright posture, still needing minA   Wheelchair Mobility    Modified Rankin (Stroke Patients Only)       Balance Overall balance assessment: Needs assistance Sitting-balance support: Feet supported;No upper extremity supported Sitting balance-Leahy Scale: Good     Standing balance support: Bilateral upper extremity supported Standing balance-Leahy  Scale: Poor Standing balance comment: needs support from therapist and RW                            Cognition Arousal/Alertness: Awake/alert Behavior During Therapy: WFL  for tasks assessed/performed Overall Cognitive Status: Within Functional Limits for tasks assessed                                 General Comments: pt reports she is tired, but able to problem solve well in regards to stairs and safety      Exercises      General Comments        Pertinent Vitals/Pain Pain Assessment: Faces Faces Pain Scale: Hurts a little bit Pain Location: right hip Pain Descriptors / Indicators: Grimacing;Guarding Pain Intervention(s): Limited activity within patient's tolerance;Monitored during session;Repositioned           PT Goals (current goals can now be found in the care plan section) Acute Rehab PT Goals Patient Stated Goal: to go home tomorrow afternoon PT Goal Formulation: With patient Time For Goal Achievement: 05/24/21 Potential to Achieve Goals: Good Progress towards PT goals: Progressing toward goals    Frequency    7X/week      PT Plan Current plan remains appropriate       AM-PAC PT "6 Clicks" Mobility   Outcome Measure  Help needed turning from your back to your side while in a flat bed without using bedrails?: A Little Help needed moving from lying on your back to sitting on the side of a flat bed without using bedrails?: A Little Help needed moving to and from a bed to a chair (including a wheelchair)?: A Little Help needed standing up from a chair using your arms (e.g., wheelchair or bedside chair)?: A Little Help needed to walk in hospital room?: A Little Help needed climbing 3-5 steps with a railing? : A Little 6 Click Score: 18    End of Session Equipment Utilized During Treatment: Gait belt Activity Tolerance: Patient limited by pain Patient left: with call bell/phone within reach;in bed;with bed alarm set Nurse Communication: Mobility status PT Visit Diagnosis: Muscle weakness (generalized) (M62.81);Difficulty in walking, not elsewhere classified (R26.2);Pain Pain - Right/Left: Right Pain - part  of body: Hip     Time: 2683-4196 PT Time Calculation (min) (ACUTE ONLY): 47 min  Charges:  $Gait Training: 23-37 mins $Therapeutic Activity: 8-22 mins                     Karma Ganja, PT, DPT   Acute Rehabilitation Department Pager #: [phone]   Otho Bellows 05/11/2021, 2:55 PM

## 2021-05-11 NOTE — Progress Notes (Signed)
FPTS Interim Progress Note  Patient sleeping and resting comfortably.  Rounded with primary RN.  No concerns voiced. No reports of headaches. No orders required.  Appreciated nightly round.  Today's Vitals   05/10/21 1429 05/10/21 1945 05/10/21 1956 05/11/21 0355  BP: 129/62  129/74   Pulse: 73  72   Resp: 16  17   Temp: 97.8 F (36.6 C)  98.4 F (36.9 C)   TempSrc: Oral  Oral   SpO2: 96%  99%   PainSc:  0-No pain  0-No pain    Carollee Leitz, MD Family Medicine Residency

## 2021-05-11 NOTE — Progress Notes (Signed)
Inpatient Diabetes Program Recommendations  AACE/ADA: New Consensus Statement on Inpatient Glycemic Control (2015)  Target Ranges:  Prepandial:   less than 140 mg/dL      Peak postprandial:   less than 180 mg/dL (1-2 hours)      Critically ill patients:  140 - 180 mg/dL   Lab Results  Component Value Date   GLUCAP 383 (H) 05/11/2021   HGBA1C 6.9 (H) 05/08/2021    Review of Glycemic Control Results for Heather [name], Heather [name] (MRN [id number]) as of 05/11/2021 14:06  Ref. Range 05/10/2021 16:34 05/10/2021 20:51 05/11/2021 07:41 05/11/2021 12:28  Glucose-Capillary Latest Ref Range: 70 - 99 mg/dL 364 (H) 353 (H) 368 (H) 383 (H)   Diabetes history: Type 2 DM Outpatient Diabetes medications: Soliqua 50 units QHS, Faxiga 10 mg QD Current orders for Inpatient glycemic control: Lantus 50 untis QD, Novolog 0-9 units TID Decadron 10 mg x 1 Decadron 4 mg x 1  Inpatient Diabetes Program Recommendations:    Noted consult for patient education and in agreement with current plan.  Reviewed patient's current A1c of 6.9% which is down from 10.2% three months ago per patient at PCP office. Explained what a A1c is and what it measures. Also reviewed goal A1c with patient, importance of good glucose control @ home, and blood sugar goals. Reviewed patho of DM, role of pancreas, impact of steroids, risk for infection with poor glycemic control, vascular changes and commorbidities.  Patient is currently wearing a [employer] and checks consistently throughout the day. Patient is aware of when to call MD. Discussed at length current CBGs while inpatient and impact of steroids to glucose trends and when to expect improvement. Also, discussed that basal dose is back up to home dose which also should help bring trends down. Denies drinking sugary beverages and has worked to bring her A1C down recently. Encouraged continued mindfulness.  Patient is scheduled to follow back up with PCP and has no further questions.    Thanks, Bronson Curb, MSN, RNC-OB Diabetes Coordinator [phone] (8a-5p)

## 2021-05-11 NOTE — Progress Notes (Signed)
   Subjective: 2 Days Post-Op Procedure(s) (LRB): TOTAL HIP ARTHROPLASTY ANTERIOR APPROACH (Right) Patient reports pain as mild.   Patient seen in rounds for Dr. Alvan Dame. Patient is well, and has had no acute complaints or problems. She reports mild pain in the thigh and knee, but no pain in the hip. She is motivated to get home today.  We will continue therapy today.   Objective: Vital signs in last 24 hours: Temp:  [97.8 F (36.6 C)-98.6 F (37 C)] 98.6 F (37 C) (06/02 0527) Pulse Rate:  [66-78] 78 (06/02 0527) Resp:  [16-18] 17 (06/02 0527) BP: (115-129)/(49-74) 125/49 (06/02 0527) SpO2:  [91 %-99 %] 95 % (06/02 0527)  Intake/Output from previous day:  Intake/Output Summary (Last 24 hours) at 05/11/2021 0756 Last data filed at 05/11/2021 0700 Gross per 24 hour  Intake 882 ml  Output 2700 ml  Net -1818 ml     Intake/Output this shift: No intake/output data recorded.  Labs: Recent Labs    05/09/21 0631 05/10/21 0917 05/11/21 0112  HGB 13.4 12.0 11.6*   Recent Labs    05/10/21 0917 05/11/21 0112  WBC 7.1 7.7  RBC 3.91 3.73*  HCT 36.5 34.2*  PLT 133* 167   Recent Labs    05/10/21 1432 05/11/21 0112  NA 133* 132*  K 4.6 5.4*  CL 99 99  CO2 20* 25  BUN 44* 51*  CREATININE 2.17* 1.90*  GLUCOSE 354* 342*  CALCIUM 7.9* 8.3*   No results for input(s): LABPT, INR in the last 72 hours.  Exam: General - Patient is Alert and Oriented Extremity - Neurologically intact Sensation intact distally Intact pulses distally Dorsiflexion/Plantar flexion intact Dressing - dressing C/D/I Motor Function - intact, moving foot and toes well on exam.   Past Medical History:  Diagnosis Date  . Atrial fibrillation ([place]) 08/14/2014  . Bell's palsy   . Bimalleolar fracture of left ankle   . Breast cancer ([place])   . Diabetes mellitus, type II ([place])   . Dysrhythmia    a-fib  . GERD (gastroesophageal reflux disease)   . Low back pain with sciatica   . Osteoarthritis   .  Proliferative diabetic retinopathy of both eyes ([place])   . Restless legs 2012  . Sleep apnea    CPAP nightly    Assessment/Plan: 2 Days Post-Op Procedure(s) (LRB): TOTAL HIP ARTHROPLASTY ANTERIOR APPROACH (Right) Active Problems:   Diabetic peripheral neuropathy ([facility])   Morbid obesity ([facility])   Atrial fibrillation ([facility])   Hip fracture ([facility])   Type 2 diabetes mellitus with diabetic chronic kidney disease (H[facility])   S/P right total hip arthroplasty  Estimated body mass index is 31.79 kg/m as calculated from the following:   Height as of 01/19/16: 5\' 7"  (1.702 m).   Weight as of 01/19/16: 92.1 kg. Advance diet Up with therapy D/C IV fluids  DVT Prophylaxis - Eliquis Weight bearing as tolerated.  Plan is to go Home after hospital stay. Plan to work with PT again today. She is ready for discharge from an orthopaedic standpoint if she passes PT. Discharge per medicine team when stable. Follow up in the office in 2 weeks. Aquacel dressing to remain in place. We discussed pain management at home. She may use tylenol & methocarbamol regularly, with Norco for breakthrough pain as needed.   Griffith Citron, PA-C Orthopedic Surgery [phone] 05/11/2021, 7:56 AM

## 2021-05-11 NOTE — Progress Notes (Signed)
Patient anticipated discharge today. However, blood sugars are uncontrolled, potassium is still high (5.9), and patient is unable to independently get up for [facility], husband is also disabled. MD called, I let the team know about this information. They spoke with patient, she will not be discharged today. Lantus dose increased to 50, new dose given to patient.

## 2021-05-11 NOTE — Progress Notes (Signed)
Family Medicine Teaching Service Daily Progress Note Intern Pager: [phone]  Patient name: Heather [name] Medical record number: [id number] Date of birth: 04-Nov-1946 Age: 75 y.o. Gender: female  Primary Care Provider: Greig Right, MD Consultants: Ortho Code Status: DNR  Pt Overview and Major Events to Date:  5/29: admitted 5/31: underwent right total hip arthroplasty  Assessment and Plan:  Heather [name] is a 75 y.o. female presenting withright subcapital femoral neck fracture. PMH is significant for bilateral knee replacements (2006 & 2007) and left hip replacement (2009), A-fib on anticoagulation, CKD stage 3b, T2DM, HLD and restless leg syndrome.  R subcapital femoral neck fracture s/p total hip arthroplasty POD #2 s/p right total hip replacement. Pain is well controlled currently. RN expressed concern about patient's ability to transfer to bedside commode on her own. - Ortho following, appreciate recommendations - Norco 5-325, 1 tablet q4h prn for moderate pain, 2 tablets q4h prn for severe pain - Scheduled Tylenol - Incentive spirometry - Miralax daily, Senna BID - PT to see again today  Hyperkalemia K mildly elevated at 5.4 again this morning. EKG yesterday was unremarkable. Etiology unclear--possibly due to kidney dysfunction, not on any meds that would cause hyperK, unlikely partially hemolyzed sample as it has occurred x2 days now. -s/p Lokelma x1 this morning -Repeat BMP tomorrow am  DM2 CBG 342 on am labs. Glucose has been significantly elevated in the 300s over past 24h. Received 25u Lantus and 14u SAI over past 24h. Of note, she did received Dexamethasone on 5/31 and 6/1. A1c 6.9% on admission. -Increase Lantus to 50u daily (this is her home dose) -sSSI -CBG monitoring with meals and bedtime  CKD stage IIIb Cr 1.9 this morning, baseline ~1.8. - Daily BMP - Avoid nephrotoxins   Atrial fibrillation  Rate controlled. Home medications: Eliquis 5mg  BID and  Metoprolol 25mg  daily.  - Eliquis 5mg  BID - Metoprolol tartrate 25mg  BID   Transaminitis- resolved AST 30, ALT 44 from 325/187 on admission. RUQ ultrasound was unremarkable. Potentially due to statin use  -Holding home Rosuvastatin  Hx OSA Untreated. Cannot tolerate CPAP - monitor respiratory status   Hx Falls and osteoporosis S/p bilateral knee replacements and L hip replacement Vit D normal at 40 - Daily calcium and Vit D supplementation  Restless Leg Syndrome Home meds: Requip 6mg  daily - Due to formulary options, will give Requip XL 4mg  with Requip IR 2mg  daily  Insomnia Ambien 5mg  nightly    FEN/GI: Carb modified diet PPx: Eliquis   Status is: Inpatient Remains inpatient appropriate because:Persistent severe electrolyte disturbances   Dispo: The patient is from: Home              Anticipated d/c is to: Home              Patient currently is not medically stable to d/c.   Difficult to place patient No   Subjective:  No acute events overnight. Patient denies complaints this morning.   Objective: Temp:  [97.8 F (36.6 C)-98.6 F (37 C)] 98.6 F (37 C) (06/02 0527) Pulse Rate:  [66-78] 78 (06/02 0527) Resp:  [16-18] 17 (06/02 0527) BP: (115-129)/(49-74) 125/49 (06/02 0527) SpO2:  [91 %-99 %] 95 % (06/02 0527) Physical Exam: General: alert, well-appearing, NAD Cardiovascular: irregularly irregular rhythm, regular rate Respiratory: normal effort, lungs CTAB Abdomen: soft, nontender, nondistended Extremities: full ROM of bilateral lower extremities, R hip bandage c/d/i, no peripheral edema  Laboratory: Recent Labs  Lab 05/09/21 0631 05/10/21 0917 05/11/21 0112  WBC 5.6  7.1 7.7  HGB 13.4 12.0 11.6*  HCT 40.5 36.5 34.2*  PLT 131* 133* 167   Recent Labs  Lab 05/09/21 0631 05/10/21 0119 05/10/21 1432 05/11/21 0112  NA 136 132* 133* 132*  K 4.5 5.4* 4.6 5.4*  CL 103 98 99 99  CO2 25 21* 20* 25  BUN 37* 37* 44* 51*  CREATININE 1.88* 1.78*  2.17* 1.90*  CALCIUM 8.4* 8.1* 7.9* 8.3*  PROT 6.0* 5.8*  --  5.6*  BILITOT 2.1* 2.0*  --  0.8  ALKPHOS 60 63  --  55  ALT 160* 114*  --  44  AST 116* 76*  --  30  GLUCOSE 104* 161* 354* 342*    Imaging/Diagnostic Tests: CT HEAD WO CONTRAST Result Date: 05/10/2021 IMPRESSION: Stable atrophy pattern and white matter microvascular ischemic changes. No acute intracranial abnormality by noncontrast CT. Electronically Signed   By: Jerilynn Mages.  Shick M.D.   On: 05/10/2021 10:29     Heather Dad, MD 05/11/2021, 7:07 AM PGY-1, [place] Intern pager: [phone], text pages welcome

## 2021-05-11 NOTE — Progress Notes (Signed)
Physical Therapy Treatment Patient Details Name: Heather [name] MRN: [id number] DOB: [birth year] Today's Date: 05/11/2021    History of Present Illness 75 y.o. female admitted on 05/07/21 for fall with R hip fx s/p R direct anterior THA on 05/09/21.  [name] is WBAT post op.  [name] with significant PMH of DM2, restless legs, diabetic retinopathy both eyes, OA, low back pain with sciatica, breast CA, L ankle fx s/p ORIF, a-fib, total hip arthroplasty in 2009, bil TKA.    [name] Comments    [name] seen this evening for second session with focus on progression of strength through functional transfers and LE exercises. [name] was able to complete multiple exercises in standing and sitting with good technique. [name] expressed no questions regarding written HEP or additional exercises completed. [name] continues to compelte transfers with minG to minA and use of RW. Discussed mobility and home set-up needs for improved independence. Heather [name] will benefit from additional [name] sessions tomorrow prior to anticipated d/c home, will benefit from HHPT to facilitate return to mobility and independence.     Follow Up Recommendations  Follow surgeon's recommendation for DC plan and follow-up therapies;Supervision for mobility/OOB     Equipment Recommendations  Rolling walker with 5" wheels;3in1 ([name])    Recommendations for Other Services       Precautions / Restrictions Precautions Precautions: Fall Precaution Comments: [name] incontinent of urine at baseline, wears briefs at home Restrictions Weight Bearing Restrictions: Yes RLE Weight Bearing: Weight bearing as tolerated    Mobility  Bed Mobility Overal bed mobility: Needs Assistance Bed Mobility: Supine to Sit     Supine to sit: Min assist     General bed mobility comments: minA without use of leg lift, can be modI with leg lifter    Transfers Overall transfer level: Needs assistance Equipment used: Rolling walker (2 wheeled) Transfers: Sit to/from [employer] Sit to Stand: Min guard Stand pivot transfers: Min guard       General transfer comment: minG with increased time to power up and increased effort. no LOB  Ambulation/Gait Ambulation/Gait assistance: Min guard Gait Distance (Feet): 5 Feet Assistive device: Rolling walker (2 wheeled) Gait Pattern/deviations: Step-to pattern;Step-through pattern;Decreased step length - right;Decreased stance time - right;Decreased stride length;Decreased weight shift to right;Antalgic Gait velocity: decreased Gait velocity interpretation: <1.31 ft/sec, indicative of household ambulator General Gait Details: small steps with minimal clearance, increased effort to advance RLE   Stairs             Wheelchair Mobility    Modified Rankin (Stroke Patients Only)       Balance Overall balance assessment: Needs assistance Sitting-balance support: Feet supported;No upper extremity supported Sitting balance-Leahy Scale: Good     Standing balance support: Bilateral upper extremity supported Standing balance-Leahy Scale: Poor Standing balance comment: needs support from therapist and RW                            Cognition Arousal/Alertness: Awake/alert Behavior During Therapy: WFL for tasks assessed/performed Overall Cognitive Status: Within Functional Limits for tasks assessed                                 General Comments: [name] reports she is tired, but able to problem solve well in regards to stairs and safety      Exercises Total Joint Exercises Long Arc Quad: AROM;Both;20 reps;Seated Knee Flexion:  AROM;Right;10 reps;Standing Marching in Standing: AROM;Both;10 reps;Standing Standing Hip Extension: AROM;Right;15 reps    General Comments        Pertinent Vitals/Pain Pain Assessment: Faces Faces Pain Scale: Hurts a little bit Pain Location: right hip Pain Descriptors / Indicators: Grimacing;Guarding Pain Intervention(s): Limited activity  within patient's tolerance;Monitored during session;Repositioned    Home Living                      Prior Function            [name] Goals (current goals can now be found in Heather care plan section) Acute Rehab [name] Goals Patient Stated Goal: to go home tomorrow afternoon [name] Goal Formulation: With patient Time For Goal Achievement: 05/24/21 Potential to Achieve Goals: Good Progress towards [name] goals: Progressing toward goals    Frequency    7X/week      [name] Plan Current plan remains appropriate    Co-evaluation              AM-PAC [name] "6 Clicks" Mobility   Outcome Measure  Help needed turning from your back to your side while in a flat bed without using bedrails?: A Little Help needed moving from lying on your back to sitting on Heather side of a flat bed without using bedrails?: A Little Help needed moving to and from a bed to a chair (including a wheelchair)?: A Little Help needed standing up from a chair using your arms (e.g., wheelchair or bedside chair)?: A Little Help needed to walk in hospital room?: A Little Help needed climbing 3-5 steps with a railing? : A Little 6 Click Score: 18    End of Session Equipment Utilized During Treatment: Gait belt Activity Tolerance: Patient limited by pain Patient left: with call bell/phone within reach;in chair;with chair alarm set;with nursing/sitter in room Nurse Communication: Mobility status [name] Visit Diagnosis: Muscle weakness (generalized) (M62.81);Difficulty in walking, not elsewhere classified (R26.2);Pain Pain - Right/Left: Right Pain - part of body: Hip     Time: 9833-8250 [name] Time Calculation (min) (ACUTE ONLY): 38 min  Charges:  $Therapeutic Exercise: 8-22 mins $Therapeutic Activity: 8-22 mins                     Karma Ganja, [name], DPT   Acute Rehabilitation Department Pager #: [phone]   Otho Bellows 05/11/2021, 6:36 PM

## 2021-05-12 LAB — GLUCOSE, CAPILLARY
Glucose-Capillary: 148 mg/dL — ABNORMAL HIGH (ref 70–99)
Glucose-Capillary: 151 mg/dL — ABNORMAL HIGH (ref 70–99)

## 2021-05-12 LAB — BASIC METABOLIC PANEL
Anion gap: 7 (ref 5–15)
BUN: 44 mg/dL — ABNORMAL HIGH (ref 8–23)
CO2: 26 mmol/L (ref 22–32)
Calcium: 8.2 mg/dL — ABNORMAL LOW (ref 8.9–10.3)
Chloride: 104 mmol/L (ref 98–111)
Creatinine, Ser: 1.67 mg/dL — ABNORMAL HIGH (ref 0.44–1.00)
GFR, Estimated: 32 mL/min — ABNORMAL LOW (ref >60)
Glucose, Bld: 142 mg/dL — ABNORMAL HIGH (ref 70–99)
Potassium: 4.1 mmol/L (ref 3.5–5.1)
Sodium: 137 mmol/L (ref 135–145)

## 2021-05-12 MED ORDER — CALCIUM CARBONATE 1250 (500 CA) MG PO TABS: 1.0000 | ORAL_TABLET | Freq: Every day | ORAL | 3 refills | Status: DC

## 2021-05-12 MED ORDER — ZOLPIDEM TARTRATE 10 MG PO TABS: 5.0000 mg | ORAL_TABLET | Freq: Every day | ORAL | 0 refills | Status: DC

## 2021-05-12 NOTE — Progress Notes (Signed)
Physical Therapy Treatment Patient Details Name: Heather [name] MRN: [id number] DOB: October 18, 1946 Today's Date: 05/12/2021    History of Present Illness 75 y.o. female admitted on 05/07/21 for fall with R hip fx s/p R direct anterior THA on 05/09/21.  Pt is WBAT post op.  Pt with significant PMH of DM2, restless legs, diabetic retinopathy both eyes, OA, low back pain with sciatica, breast CA, L ankle fx s/p ORIF, a-fib, total hip arthroplasty in 2009, bil TKA.    PT Comments    Pt seen for continued progression of stair training and independence with OOB mobility. The pt was able to make great progress with stair navigation. Completing x3 steps with single rail to mimic home set up with minG only. The pt was then able to dramatically increase ambulation distance (from 10 ft yesterday to 5 ft today) with minG and RW. The pt will continue to benefit from one final session of acute PT to continue functional strengthening and education, but will then be safe to d/c home with family assist. The pt would benefit from HHPT to facilitate progress and safety with mobility once at home.     Follow Up Recommendations  Follow surgeon's recommendation for DC plan and follow-up therapies;Supervision for mobility/OOB     Equipment Recommendations  Rolling walker with 5" wheels;3in1 (PT)    Recommendations for Other Services       Precautions / Restrictions Precautions Precautions: Fall Precaution Comments: pt incontinent of urine at baseline, wears briefs at home Restrictions Weight Bearing Restrictions: Yes RLE Weight Bearing: Weight bearing as tolerated    Mobility  Bed Mobility Overal bed mobility: Needs Assistance Bed Mobility: Supine to Sit     Supine to sit: Modified independent (Device/Increase time)     General bed mobility comments: modI with use of sheet for leg lift. increased time and effort to complete scoot to EOB    Transfers Overall transfer level: Needs assistance Equipment  used: Rolling walker (2 wheeled) Transfers: Sit to/from Stand Sit to Stand: Supervision         General transfer comment: supervision for safety, no physical assist given. increased time to power up and steady with RW  Ambulation/Gait Ambulation/Gait assistance: Min guard Gait Distance (Feet): 25 Feet Assistive device: Rolling walker (2 wheeled) Gait Pattern/deviations: Step-to pattern;Step-through pattern;Decreased step length - right;Decreased stance time - right;Decreased stride length;Decreased weight shift to right;Antalgic Gait velocity: decreased Gait velocity interpretation: <1.31 ft/sec, indicative of household ambulator General Gait Details: small steps with minimal clearance, step-through but very segmental with RW, cues for increased fluidity, R hip flexion   Stairs Stairs: Yes Stairs assistance: Min guard Stair Management: One rail Left;Step to pattern;Forwards Number of Stairs: 3 General stair comments: L rail with minG to step up, VC for sequencing. pt does better with stepping down backwards      Balance Overall balance assessment: Needs assistance Sitting-balance support: Feet supported;No upper extremity supported Sitting balance-Leahy Scale: Good     Standing balance support: Bilateral upper extremity supported Standing balance-Leahy Scale: Poor Standing balance comment: needs support from RW                            Cognition Arousal/Alertness: Awake/alert Behavior During Therapy: [facility] for tasks assessed/performed Overall Cognitive Status: Within Functional Limits for tasks assessed  General Comments: pt reports she is tired, but able to problem solve well in regards to stairs and safety      Exercises      General Comments General comments (skin integrity, edema, etc.): VSS      Pertinent Vitals/Pain Pain Assessment: Faces Faces Pain Scale: Hurts a little bit Pain Location: right  hip Pain Descriptors / Indicators: Grimacing;Guarding Pain Intervention(s): Limited activity within patient's tolerance;Monitored during session;Repositioned           PT Goals (current goals can now be found in the care plan section) Acute Rehab PT Goals Patient Stated Goal: to go home this afternoon PT Goal Formulation: With patient Time For Goal Achievement: 05/24/21 Potential to Achieve Goals: Good Progress towards PT goals: Progressing toward goals    Frequency    7X/week      PT Plan Current plan remains appropriate       AM-PAC PT "6 Clicks" Mobility   Outcome Measure  Help needed turning from your back to your side while in a flat bed without using bedrails?: A Little Help needed moving from lying on your back to sitting on the side of a flat bed without using bedrails?: A Little Help needed moving to and from a bed to a chair (including a wheelchair)?: A Little Help needed standing up from a chair using your arms (e.g., wheelchair or bedside chair)?: A Little Help needed to walk in hospital room?: A Little Help needed climbing 3-5 steps with a railing? : A Little 6 Click Score: 18    End of Session Equipment Utilized During Treatment: Gait belt Activity Tolerance: Patient tolerated treatment well;No increased pain Patient left: with call bell/phone within reach;in chair;with chair alarm set Nurse Communication: Mobility status PT Visit Diagnosis: Muscle weakness (generalized) (M62.81);Difficulty in walking, not elsewhere classified (R26.2);Pain Pain - Right/Left: Right Pain - part of body: Hip     Time: 8250-0370 PT Time Calculation (min) (ACUTE ONLY): 28 min  Charges:  $Gait Training: 23-37 mins                    Karma Ganja, PT, DPT   Acute Rehabilitation Department Pager #: [phone]   Otho Bellows 05/12/2021, 9:54 AM

## 2021-05-12 NOTE — Progress Notes (Signed)
Physical Therapy Treatment Patient Details Name: Heather [name] MRN: [id number] DOB: 05-15-1946 Today's Date: 05/12/2021    History of Present Illness 75 y.o. female admitted on 05/07/21 for fall with R hip fx s/p R direct anterior THA on 05/09/21.  Pt is WBAT post op.  Pt with significant PMH of DM2, restless legs, diabetic retinopathy both eyes, OA, low back pain with sciatica, breast CA, L ankle fx s/p ORIF, a-fib, total hip arthroplasty in 2009, bil TKA.    PT Comments    Pt seen this afternoon for final education and review of HEP to prepare for d/c home. The pt was able to demo good progress with safety understanding and application of education from this and prior session to problem solving for safe mobility and management at home. We discussed use of gait belt for RLE leg lift, management of toileting, fall risk reduction in the home, and safe sleeping positions. The pt was also educated on standing exercises and verbalized understanding of all education. The pt will be safe to return home with family support as she is at supervision level for transfers and home distances of ambulation at this time, but will benefit from HHPT to facilitate improvements in strength and endurance to further improve safety with mobility.    Follow Up Recommendations  Follow surgeon's recommendation for DC plan and follow-up therapies;Supervision for mobility/OOB     Equipment Recommendations  Rolling walker with 5" wheels;3in1 (PT)    Recommendations for Other Services       Precautions / Restrictions Precautions Precautions: Fall Precaution Comments: pt incontinent of urine at baseline, wears briefs at home Restrictions Weight Bearing Restrictions: Yes RLE Weight Bearing: Weight bearing as tolerated    Mobility  Bed Mobility Overal bed mobility: Modified Independent Bed Mobility: Supine to Sit     Supine to sit: Modified independent (Device/Increase time)     General bed mobility comments:  modI with use of sheet or gait belt for RLE leg lift. pt able to demo good movement without assist    Transfers Overall transfer level: Needs assistance Equipment used: Rolling walker (2 wheeled) Transfers: Sit to/from Stand Sit to Stand: Supervision         General transfer comment: pt declined due to fatigue, focus on education and reviewing HEP, all transfers were completed with good stability and supervision this morning  Ambulation/Gait Ambulation/Gait assistance: Min guard Gait Distance (Feet): 25 Feet Assistive device: Rolling walker (2 wheeled) Gait Pattern/deviations: Step-to pattern;Step-through pattern;Decreased step length - right;Decreased stance time - right;Decreased stride length;Decreased weight shift to right;Antalgic Gait velocity: decreased Gait velocity interpretation: <1.31 ft/sec, indicative of household ambulator General Gait Details: small steps with minimal clearance, step-through but very segmental with RW, cues for increased fluidity, R hip flexion   Stairs Stairs: Yes Stairs assistance: Min guard Stair Management: One rail Left;Step to pattern;Forwards Number of Stairs: 3 General stair comments: L rail with minG to step up, VC for sequencing. pt does better with stepping down backwards         Balance Overall balance assessment: Needs assistance Sitting-balance support: Feet supported;No upper extremity supported Sitting balance-Leahy Scale: Good     Standing balance support: Bilateral upper extremity supported Standing balance-Leahy Scale: Poor Standing balance comment: needs support from RW                            Cognition Arousal/Alertness: Lethargic Behavior During Therapy: [facility] for tasks assessed/performed Overall Cognitive Status:  Within Functional Limits for tasks assessed                                 General Comments: pt reports fatigue, but was able to demo good recall of education and ask good  questions indicating understanding of safety awareness at home      Exercises      General Comments General comments (skin integrity, edema, etc.): education on HEP, fall risk reduction, HHPT, and safe home set up. pt asking good questions, all questions answered      Pertinent Vitals/Pain Pain Assessment: Faces Faces Pain Scale: No hurt Pain Location: right hip Pain Descriptors / Indicators: Grimacing;Guarding Pain Intervention(s): Monitored during session           PT Goals (current goals can now be found in the care plan section) Acute Rehab PT Goals Patient Stated Goal: to go home this afternoon PT Goal Formulation: With patient Time For Goal Achievement: 05/24/21 Potential to Achieve Goals: Good Progress towards PT goals: Progressing toward goals    Frequency    7X/week      PT Plan Current plan remains appropriate       AM-PAC PT "6 Clicks" Mobility   Outcome Measure  Help needed turning from your back to your side while in a flat bed without using bedrails?: A Little Help needed moving from lying on your back to sitting on the side of a flat bed without using bedrails?: A Little Help needed moving to and from a bed to a chair (including a wheelchair)?: A Little Help needed standing up from a chair using your arms (e.g., wheelchair or bedside chair)?: A Little Help needed to walk in hospital room?: A Little Help needed climbing 3-5 steps with a railing? : A Little 6 Click Score: 18    End of Session Equipment Utilized During Treatment: Gait belt Activity Tolerance: Patient limited by fatigue Patient left: with call bell/phone within reach;in bed;with nursing/sitter in room Nurse Communication: Mobility status PT Visit Diagnosis: Muscle weakness (generalized) (M62.81);Difficulty in walking, not elsewhere classified (R26.2);Pain Pain - Right/Left: Right Pain - part of body: Hip     Time: 7471-8550 PT Time Calculation (min) (ACUTE ONLY): 21  min  Charges:  $Gait Training: 23-37 mins $Self Care/Home Management: 8-22                     Karma Ganja, PT, DPT   Acute Rehabilitation Department Pager #: [phone]   Otho Bellows 05/12/2021, 12:27 PM

## 2021-05-12 NOTE — Progress Notes (Signed)
FPTS Interim Progress Note  Patient alert and resting comfortably. Having some muscle twinges in upper right thigh.  Now exercising leg and improving. Does not think she will be ready to go home today. Rounded with primary RN.  No concerns voiced.  No orders required.  Appreciated nightly round.  Today's Vitals   05/11/21 2156 05/12/21 0213 05/12/21 0257 05/12/21 0418  BP:    128/64  Pulse:    69  Resp:    16  Temp:    98.5 F (36.9 C)  TempSrc:    Oral  SpO2:    96%  PainSc: 0-No pain 8  Asleep     Carollee Leitz, MD [facility]

## 2021-05-12 NOTE — Discharge Summary (Signed)
[facility] Discharge Summary  Patient name: Heather [name] Medical record number: [id number] Date of birth: [birth year] Age: 75 y.o. Gender: female Date of Admission: 05/07/2021  Date of Discharge: 05/12/2021 Admitting Physician: Richarda Osmond, DO  Primary Care Provider: Greig Right, MD Consultants: Orthopedic surgery  Indication for Hospitalization: R femoral neck fracture  Discharge Diagnoses/Problem List:  R femoral neck fracture S/p r total hip arthroplasty T2DM A-fib CKD Stage 3b Hyperkalemia Transaminitis Osteoporosis Restless leg syndrome Insomnia OSA  Disposition: Home  Discharge Condition: Improved, stable  Discharge Exam:  General: alert, well-appearing, NAD  Cardiovascular: irregularly irregular rhythm, regular rate  Respiratory: normal effort, lungs CTAB  Abdomen: soft, nontender, nondistended  Extremities: R hip bandage c/d/i, no peripheral edema, good ROM of bilateral lower extremities  Brief Hospital Course:  Heather [name] is a 75 y.o. female who presented with right subcapital femoral neck fracture s/p fall. PMH is significant for bilateral knee replacements (2006 & 2007) and left hip replacement (2009), A-fib on anticoagulation, CKD stage 3b, T2DM, HLD and RLS.  R subcapital femoral neck fracture  Patient presented with right hip pain after fall and was found to have nondisplaced, non comminuted subcapital fracture of the right femoral neck. Patient was followed by Orthopedic Surgery who performed total right hip replacement on 5/31. Her pain was well controlled post-operatively with Norco and she progressed quickly with physical therapy.   T2DM A1c 6.9% on admission. Her sugars were elevated in the 300s during admission, however she did receive dexamethasone post-op and was initially on 25u lantus rather than her home 50u. On the day of discharge CBG was significantly improved at 142.    Transaminitis On admission AST  325, ALT 187. Potentially due to statin use, so statin was held. RUQ [place] was unremarkable. LFTs had normalized by the time of discharge.  Osteoporosis S/p bilateral knee replacements and now bilateral hip replacements. Vit D normal at 40. She was started on Calcium and Vit D supplementation.  Hyperkalemia Patient with 2 episodes of hyperkalemia to 5.4 (received Lokelma x2). K wnl at 4.1 on day of discharge. Recommend repeat BMP as an outpatient.  Atrial fibrillation  Rate controlled with Metoprolol throughout admission. Her home elqiuis was restarted  24 hrs after hip replacement surgery.   Issues for Follow Up:  1. Statin was held due to transaminitis. Would recommend repeating CMP and restarting Rosuvastatin at 10mg  daily if LFTs remain wnl. 2. Repeat CMP in 1 week to ensure no hyperkalemia 3. Consider starting bisphosphonate therapy for osteoporosis  Significant Procedures:  R total hip arthroplasty on 5/31  Significant Labs and Imaging:  Recent Labs  Lab 05/09/21 0631 05/10/21 0917 05/11/21 0112  WBC 5.6 7.1 7.7  HGB 13.4 12.0 11.6*  HCT 40.5 36.5 34.2*  PLT 131* 133* 167   Recent Labs  Lab 05/08/21 0013 05/09/21 0631 05/10/21 0119 05/10/21 1432 05/11/21 0112 05/11/21 1014 05/12/21 0644  NA 135 136 132* 133* 132* 134* 137  K 4.6 4.5 5.4* 4.6 5.4* 4.4 4.1  CL 103 103 98 99 99 98 104  CO2 23 25 21* 20* 25 25 26   GLUCOSE 250* 104* 161* 354* 342* 287* 142*  BUN 40* 37* 37* 44* 51* 49* 44*  CREATININE 1.79* 1.88* 1.78* 2.17* 1.90* 1.84* 1.67*  CALCIUM 8.4* 8.4* 8.1* 7.9* 8.3* 8.3* 8.2*  ALKPHOS 52 60 63  --  55  --   --   AST 325* 116* 76*  --  30  --   --  ALT 187* 160* 114*  --  44  --   --   ALBUMIN 3.0* 2.7* 2.7*  --  2.4*  --   --     Results/Tests Pending at Time of Discharge: None  Discharge Medications:  Allergies as of 05/12/2021      Reactions   Sulfa Antibiotics Itching   Sulfasalazine Itching      Medication List    STOP taking these  medications   rosuvastatin 40 MG tablet Commonly known as: CRESTOR   tiZANidine 4 MG tablet Commonly known as: ZANAFLEX   traMADol 50 MG tablet Commonly known as: ULTRAM     TAKE these medications   calcium carbonate 1250 (500 Ca) MG tablet Commonly known as: OS-CAL - dosed in mg of elemental calcium Take 1 tablet (500 mg of elemental calcium total) by mouth daily with breakfast.   Cholecalciferol 50 MCG (2000 UT) Tabs Take 2,000 Units by mouth every morning.   dapagliflozin propanediol 10 MG Tabs tablet Commonly known as: FARXIGA Take 10 mg by mouth every morning.   DULoxetine 60 MG capsule Commonly known as: CYMBALTA Take 60 mg by mouth every morning.   Eliquis 5 MG Tabs tablet Generic drug: apixaban Take 5 mg by mouth 2 (two) times daily.   HYDROcodone-acetaminophen 5-325 MG tablet Commonly known as: NORCO/VICODIN Take 1 tablet by mouth every 6 (six) hours as needed.   methocarbamol 500 MG tablet Commonly known as: Robaxin Take 1 tablet (500 mg total) by mouth every 6 (six) hours as needed for muscle spasms.   metoprolol succinate 50 MG 24 hr tablet Commonly known as: TOPROL-XL Take 25 mg by mouth every morning.   multivitamin with minerals Tabs tablet Take 1 tablet by mouth every morning.   PATADAY OP Place 1 drop into both eyes daily as needed (allergies/itching).   polyethylene glycol 17 g packet Commonly known as: MIRALAX / GLYCOLAX Take 17 g by mouth 2 (two) times daily.   Ropinirole HCl 6 MG Tb24 Take 6 mg by mouth every morning.   Soliqua 100-33 UNT-MCG/ML Sopn Generic drug: Insulin Glargine-Lixisenatide Inject 50 Units into the skin at bedtime.   Systane 0.4-0.3 % Soln Generic drug: Polyethyl Glycol-Propyl Glycol Place 1 drop into both eyes daily as needed (dry eyes).   zolpidem 10 MG tablet Commonly known as: AMBIEN Take 0.5 tablets (5 mg total) by mouth at bedtime. What changed: how much to take            Durable Medical  Equipment  (From admission, onward)         Start     Ordered   05/12/21 0925  For home use only DME Walker rolling  Once       Question Answer Comment  Walker: With 5 Inch Wheels   Patient needs a walker to treat with the following condition S/P total right hip arthroplasty      05/12/21 0924   05/12/21 0925  For home use only DME 3 n 1  Once        05/12/21 0924           Discharge Care Instructions  (From admission, onward)         Start     Ordered   05/12/21 0000  Leave dressing on - Keep it clean, dry, and intact until clinic visit        05/12/21 1414          Discharge Instructions: Please refer to Patient Instructions  section of EMR for full details.  Patient was counseled important signs and symptoms that should prompt return to medical care, changes in medications, dietary instructions, activity restrictions, and follow up appointments.   Follow-Up Appointments:  Follow-up Information    Paralee Cancel, MD. Schedule an appointment as soon as possible for a visit in 2 weeks.   Specialty: Orthopedic Surgery Contact information: [address] [place] [address] [phone]        Greig Right, MD. Schedule an appointment as soon as possible for a visit in 3 day(s).   Specialty: Family Medicine Why: Hospital f/u Contact information: [place] [phone]               Alcus Dad, MD 05/12/2021, 5:50 PM PGY-1, [place]

## 2021-05-12 NOTE — TOC Initial Note (Addendum)
Transition of Care [facility]) - Initial/Assessment Note    Patient Details  Name: Heather [name] MRN: [id number] Date of Birth: 1946/07/13  Transition of Care [facility]) CM/SW Contact:    Marilu Favre, RN Phone Number: 05/12/2021, 1:12 PM  Clinical Narrative:                 Patient from home with husband. Ordered walker and 3 in1 with Freda Munro with [facility].   Tommi Rumps with [facility] accepted referral for HHPT , paged family medicine for hhpt order and face to face   Expected Discharge Plan: [place] Barriers to Discharge: No Barriers Identified   Patient Goals and CMS Choice Patient states their goals for this hospitalization and ongoing recovery are:: to return to home CMS Medicare.gov Compare Post Acute Care list provided to:: Patient Choice offered to / list presented to : Patient  Expected Discharge Plan and Services Expected Discharge Plan: [place]   Discharge Planning Services: CM Consult Post Acute Care Choice: Home Health,Durable Medical Equipment Living arrangements for the past 2 months: Single Family Home                 DME Arranged: 3-N-1,Walker rolling DME Agency: [employer] Date DME Agency Contacted: 05/12/21 Time DME Agency Contacted: 0865 Representative spoke with at DME Agency: Freda Munro HH Arranged: PT [place]: [place] Date [place]: 05/12/21 Time [place]: 1311 Representative spoke with at [place]: Tommi Rumps  Prior Living Arrangements/Services Living arrangements for the past 2 months: [place] Lives with:: Spouse Patient language and need for interpreter reviewed:: Yes Do you feel safe going back to the place where you live?: Yes      Need for Family Participation in Patient Care: Yes (Comment) Care giver support system in place?: Yes (comment)   Criminal Activity/Legal Involvement Pertinent to Current Situation/Hospitalization: No - Comment as needed  Activities of  Daily Living      Permission Sought/Granted   Permission granted to share information with : No              Emotional Assessment Appearance:: Appears stated age Attitude/Demeanor/Rapport: Engaged Affect (typically observed): Accepting Orientation: : Oriented to Self,Oriented to Place,Oriented to  Time,Oriented to Situation Alcohol / Substance Use: Not Applicable Psych Involvement: No (comment)  Admission diagnosis:  Hip fracture ([place]) [S72.009A] Pain [R52] Fall [W19.XXXA] Diabetic peripheral angiopathy ([place]) [E11.51] Right hand pain [M79.641] Right hip pain [M25.551] Fall, initial encounter [W19.XXXA] Diabetic polyneuropathy associated with type 2 diabetes mellitus ([place]) [E11.42] Patient Active Problem List   Diagnosis Date Noted  . S/P right total hip arthroplasty 05/09/2021  . Type 2 diabetes mellitus with diabetic chronic kidney disease ([place]) 05/08/2021  . Diabetic peripheral angiopathy ([place]) 05/07/2021  . Hip fracture ([place]) 05/07/2021  . Breast cancer in situ 04/17/2016  . Chronic kidney disease 04/17/2016  . Acute onset aura migraine 04/17/2016  . Fasciculation 04/17/2016  . Obstructive apnea 04/17/2016  . Restless leg 04/17/2016  . Long term current use of anticoagulant 05/30/2015  . Other long term (current) drug therapy 05/30/2015  . Atrial fibrillation ([place]) 08/14/2014  . Type II or unspecified type diabetes mellitus with peripheral circulatory disorders, uncontrolled(250.72) 05/15/2013  . Pains, foot 05/15/2013  . Diabetic peripheral neuropathy ([place]) 05/15/2013  . Morbid obesity ([place]) 05/15/2013  . Bilateral leg edema 05/15/2013  . Edema of foot 05/15/2013  . Diabetes mellitus ([place]) 05/15/2013  . Foot pain 05/15/2013  . Osteoarthritis   .  Proliferative diabetic retinopathy of both eyes ([place])   . Bell's palsy    PCP:  Greig Right, MD Pharmacy:   [facility], [place] [address] Phone:  [phone] Fax: [phone]  [facility] [place], [address] [place] [address] Phone: [phone] Fax: [phone]     Social Determinants of Health (SDOH) Interventions    Readmission Risk Interventions No flowsheet data found.

## 2021-05-13 ENCOUNTER — Other Ambulatory Visit: Payer: Self-pay

## 2021-05-13 ENCOUNTER — Emergency Department (HOSPITAL_COMMUNITY)
Admission: EM | Admit: 2021-05-13 | Discharge: 2021-05-15 | Disposition: A | Discharge status: Home / Self Care | Payer: PPO | Attending: Emergency Medicine | Admitting: Emergency Medicine

## 2021-05-13 ENCOUNTER — Encounter (HOSPITAL_COMMUNITY): Payer: Self-pay

## 2021-05-13 DIAGNOSIS — N189 Chronic kidney disease, unspecified: Secondary | ICD-10-CM | POA: Diagnosis not present

## 2021-05-13 DIAGNOSIS — Z7901 Long term (current) use of anticoagulants: Secondary | ICD-10-CM | POA: Diagnosis not present

## 2021-05-13 DIAGNOSIS — E1122 Type 2 diabetes mellitus with diabetic chronic kidney disease: Secondary | ICD-10-CM | POA: Insufficient documentation

## 2021-05-13 DIAGNOSIS — R531 Weakness: Secondary | ICD-10-CM | POA: Diagnosis not present

## 2021-05-13 DIAGNOSIS — Z794 Long term (current) use of insulin: Secondary | ICD-10-CM | POA: Diagnosis not present

## 2021-05-13 DIAGNOSIS — I4891 Unspecified atrial fibrillation: Secondary | ICD-10-CM | POA: Diagnosis not present

## 2021-05-13 DIAGNOSIS — Z96641 Presence of right artificial hip joint: Secondary | ICD-10-CM | POA: Insufficient documentation

## 2021-05-13 DIAGNOSIS — Z853 Personal history of malignant neoplasm of breast: Secondary | ICD-10-CM | POA: Diagnosis not present

## 2021-05-13 DIAGNOSIS — Z96653 Presence of artificial knee joint, bilateral: Secondary | ICD-10-CM | POA: Diagnosis not present

## 2021-05-13 DIAGNOSIS — Z20822 Contact with and (suspected) exposure to covid-19: Secondary | ICD-10-CM | POA: Insufficient documentation

## 2021-05-13 DIAGNOSIS — D649 Anemia, unspecified: Secondary | ICD-10-CM | POA: Diagnosis not present

## 2021-05-13 DIAGNOSIS — M7989 Other specified soft tissue disorders: Secondary | ICD-10-CM | POA: Insufficient documentation

## 2021-05-13 DIAGNOSIS — E11319 Type 2 diabetes mellitus with unspecified diabetic retinopathy without macular edema: Secondary | ICD-10-CM | POA: Diagnosis not present

## 2021-05-13 DIAGNOSIS — M79604 Pain in right leg: Secondary | ICD-10-CM | POA: Diagnosis not present

## 2021-05-13 DIAGNOSIS — I959 Hypotension, unspecified: Secondary | ICD-10-CM | POA: Diagnosis not present

## 2021-05-13 LAB — BASIC METABOLIC PANEL
Anion gap: 11 (ref 5–15)
BUN: 35 mg/dL — ABNORMAL HIGH (ref 8–23)
CO2: 24 mmol/L (ref 22–32)
Calcium: 8 mg/dL — ABNORMAL LOW (ref 8.9–10.3)
Chloride: 100 mmol/L (ref 98–111)
Creatinine, Ser: 1.49 mg/dL — ABNORMAL HIGH (ref 0.44–1.00)
GFR, Estimated: 36 mL/min — ABNORMAL LOW (ref >60)
Glucose, Bld: 232 mg/dL — ABNORMAL HIGH (ref 70–99)
Potassium: 4.1 mmol/L (ref 3.5–5.1)
Sodium: 135 mmol/L (ref 135–145)

## 2021-05-13 LAB — POC OCCULT BLOOD, ED: Fecal Occult Bld: NEGATIVE

## 2021-05-13 LAB — RESP PANEL BY RT-PCR (FLU A&B, COVID) ARPGX2
Influenza A by PCR: NEGATIVE
Influenza B by PCR: NEGATIVE
SARS Coronavirus 2 by RT PCR: NEGATIVE

## 2021-05-13 LAB — CBC
HCT: 32 % — ABNORMAL LOW (ref 36.0–46.0)
Hemoglobin: 10.3 g/dL — ABNORMAL LOW (ref 12.0–15.0)
MCH: 30.8 pg (ref 26.0–34.0)
MCHC: 32.2 g/dL (ref 30.0–36.0)
MCV: 95.8 fL (ref 80.0–100.0)
Platelets: 229 10*3/uL (ref 150–400)
RBC: 3.34 MIL/uL — ABNORMAL LOW (ref 3.87–5.11)
RDW: 12.6 % (ref 11.5–15.5)
WBC: 7.5 10*3/uL (ref 4.0–10.5)
nRBC: 0 % (ref 0.0–0.2)

## 2021-05-13 MED ORDER — SODIUM CHLORIDE 0.9 % IV BOLUS
500.0000 mL | Freq: Once | INTRAVENOUS | Status: AC
  Administered 2021-05-13: 500 mL via INTRAVENOUS

## 2021-05-13 MED ORDER — HYDROCODONE-ACETAMINOPHEN 5-325 MG PO TABS
1.0000 | ORAL_TABLET | Freq: Four times a day (QID) | ORAL | Status: DC | PRN
  Administered 2021-05-14 – 2021-05-15 (×4): 1 via ORAL
  Filled 2021-05-13 (×4): qty 1

## 2021-05-13 MED ORDER — METHOCARBAMOL 500 MG PO TABS
500.0000 mg | ORAL_TABLET | Freq: Four times a day (QID) | ORAL | Status: DC | PRN
  Administered 2021-05-15 (×2): 500 mg via ORAL
  Filled 2021-05-13 (×2): qty 1

## 2021-05-13 NOTE — ED Notes (Signed)
Attempted to get urine on pt using female urinal.Pt reports not having to go.Pt used her brief earlier

## 2021-05-13 NOTE — NC FL2 (Signed)
[place] LEVEL OF CARE SCREENING TOOL     IDENTIFICATION  Patient Name: Heather [name] Birthdate: [birth year] Sex: female Admission Date (Current Location): 05/13/2021  [facility] and [place] Number:  Herbalist and Address:  [facility]. [facility], [place] [address] [place], [place] [address]      Provider Number: [id number]  Attending Physician Name and Address:  Rex Kras Wenda Overland, MD  Relative Name and Phone Number:  Ebony Cargo [phone]    Current Level of Care: Hospital (ED) Recommended Level of Care: [place] Prior Approval Number:    Date Approved/Denied:   PASRR Number: [id number]  Discharge Plan: SNF    Current Diagnoses: Patient Active Problem List   Diagnosis Date Noted  . S/P right total hip arthroplasty 05/09/2021  . Type 2 diabetes mellitus with diabetic chronic kidney disease ([place]) 05/08/2021  . Diabetic peripheral angiopathy ([place]) 05/07/2021  . Hip fracture ([place]) 05/07/2021  . Breast cancer in situ 04/17/2016  . Chronic kidney disease 04/17/2016  . Acute onset aura migraine 04/17/2016  . Fasciculation 04/17/2016  . Obstructive apnea 04/17/2016  . Restless leg 04/17/2016  . Long term current use of anticoagulant 05/30/2015  . Other long term (current) drug therapy 05/30/2015  . Atrial fibrillation ([place]) 08/14/2014  . Type II or unspecified type diabetes mellitus with peripheral circulatory disorders, uncontrolled(250.72) 05/15/2013  . Pains, foot 05/15/2013  . Diabetic peripheral neuropathy ([place]) 05/15/2013  . Morbid obesity ([place]) 05/15/2013  . Bilateral leg edema 05/15/2013  . Edema of foot 05/15/2013  . Diabetes mellitus ([place]) 05/15/2013  . Foot pain 05/15/2013  . Osteoarthritis   . Proliferative diabetic retinopathy of both eyes ([place])   . Bell's palsy     Orientation RESPIRATION BLADDER Height & Weight     Self,Time,Place,Situation  Normal Incontinent (briefs) Weight:   Height:      BEHAVIORAL SYMPTOMS/MOOD NEUROLOGICAL BOWEL NUTRITION STATUS        Diet (See discharge summary)  AMBULATORY STATUS COMMUNICATION OF NEEDS Skin   Extensive Assist Verbally Surgical wounds (HIp replacement 4 days ago)                       Personal Care Assistance Level of Assistance  Bathing,Dressing,Total care Bathing Assistance: Maximum assistance   Dressing Assistance: Maximum assistance Total Care Assistance: Maximum assistance   Functional Limitations Info    Sight Info: Adequate        SPECIAL CARE FACTORS FREQUENCY  PT (By licensed PT),OT (By licensed OT)     PT Frequency: 5 x a week OT Frequency: 5 x a week            Contractures Contractures Info: Not present    Additional Factors Info                  Current Medications (05/13/2021):  This is the current hospital active medication list No current facility-administered medications for this encounter.   Current Outpatient Medications  Medication Sig Dispense Refill  . apixaban (ELIQUIS) 5 MG TABS tablet Take 5 mg by mouth 2 (two) times daily.    . calcium carbonate (OS-CAL - DOSED IN MG OF ELEMENTAL CALCIUM) 1250 (500 Ca) MG tablet Take 1 tablet (500 mg of elemental calcium total) by mouth daily with breakfast. 90 tablet 3  . Cholecalciferol 2000 UNITS TABS Take 2,000 Units by mouth every morning.    . dapagliflozin propanediol (FARXIGA) 10 MG TABS tablet Take 10 mg  by mouth every morning.    . DULoxetine (CYMBALTA) 60 MG capsule Take 60 mg by mouth every morning.    Marland Kitchen HYDROcodone-acetaminophen (NORCO/VICODIN) 5-325 MG tablet Take 1 tablet by mouth every 6 (six) hours as needed. 30 tablet 0  . Insulin Glargine-Lixisenatide (SOLIQUA) 100-33 UNT-MCG/ML SOPN Inject 50 Units into the skin at bedtime.    . methocarbamol (ROBAXIN) 500 MG tablet Take 1 tablet (500 mg total) by mouth every 6 (six) hours as needed for muscle spasms. 40 tablet 0  . metoprolol succinate (TOPROL-XL) 50 MG 24 hr tablet Take 25  mg by mouth every morning.    . Multiple Vitamin (MULTIVITAMIN WITH MINERALS) TABS tablet Take 1 tablet by mouth every morning.    . Olopatadine HCl (PATADAY OP) Place 1 drop into both eyes daily as needed (allergies/itching).    Vladimir Faster Glycol-Propyl Glycol (SYSTANE) 0.4-0.3 % SOLN Place 1 drop into both eyes daily as needed (dry eyes).    . polyethylene glycol (MIRALAX / GLYCOLAX) 17 g packet Take 17 g by mouth 2 (two) times daily. 14 each 0  . Ropinirole HCl 6 MG TB24 Take 6 mg by mouth every morning.    . zolpidem (AMBIEN) 10 MG tablet Take 0.5 tablets (5 mg total) by mouth at bedtime. 30 tablet 0     Discharge Medications: Please see discharge summary for a list of discharge medications.  Relevant Imaging Results:  Relevant Lab Results:   Additional Information SSN# [id number]  Verdell Carmine, RN

## 2021-05-13 NOTE — ED Triage Notes (Signed)
Patient arrived by EMS from home requesting IP rehab placement. Had total right hip replacement this past Wednesday and now unable to manage at home. Patient states that when she tries to ambulate she becomes dizzy

## 2021-05-13 NOTE — ED Notes (Signed)
Angelia daug. Inlaw call with any updates [phone]

## 2021-05-13 NOTE — TOC Initial Note (Signed)
Transition of Care [facility]) - Initial/Assessment Note    Patient Details  Name: Heather [name] MRN: [id number] Date of Birth: [birth year]  [facility]) CM/SW Contact:    Verdell Carmine, RN Phone Number: 05/13/2021, 11:05 AM  Clinical Narrative:                  Patient arrived in the ED ,discharged from hospital yesterday after THR. Patient states she cannot manage at home and would like "inpatient rehab" secure messaged [facility] [facility], ordered PT for re-evaluation.        Patient Goals and CMS Choice        Expected Discharge Plan and Services                                                Prior Living Arrangements/Services  Home with home health                     Activities of Daily Living      Permission Sought/Granted                  Emotional Assessment              Admission diagnosis:  Leg Pain (right); Requesting a nursing facility  Patient Active Problem List   Diagnosis Date Noted  . S/P right total hip arthroplasty 05/09/2021  . Type 2 diabetes mellitus with diabetic chronic kidney disease ([place]) 05/08/2021  . Diabetic peripheral angiopathy ([place]) 05/07/2021  . Hip fracture ([place]) 05/07/2021  . Breast cancer in situ 04/17/2016  . Chronic kidney disease 04/17/2016  . Acute onset aura migraine 04/17/2016  . Fasciculation 04/17/2016  . Obstructive apnea 04/17/2016  . Restless leg 04/17/2016  . Long term current use of anticoagulant 05/30/2015  . Other long term (current) drug therapy 05/30/2015  . Atrial fibrillation ([place]) 08/14/2014  . Type II or unspecified type diabetes mellitus with peripheral circulatory disorders, uncontrolled(250.72) 05/15/2013  . Pains, foot 05/15/2013  . Diabetic peripheral neuropathy ([place]) 05/15/2013  . Morbid obesity ([place]) 05/15/2013  . Bilateral leg edema 05/15/2013  . Edema of foot 05/15/2013  . Diabetes mellitus ([place]) 05/15/2013  . Foot pain 05/15/2013  . Osteoarthritis   .  Proliferative diabetic retinopathy of both eyes ([place])   . Bell's palsy    PCP:  Greig Right, MD Pharmacy:   [facility], [place] [address] Phone: [phone] Fax: [phone]  [facility] [place], [place] [address] [place] [address] Phone: [phone] Fax: [phone]     Social Determinants of Health (SDOH) Interventions    Readmission Risk Interventions No flowsheet data found.

## 2021-05-13 NOTE — ED Provider Notes (Signed)
[facility] EMERGENCY DEPARTMENT Provider Note   CSN: [id number] Arrival date & time: 05/13/21  1027     History No chief complaint on file.   Bryttani Blew is a 75 y.o. female with PMH/o DM, GERD, who presents for evaluation of difficulty ambulating.  Patient reports she recently had right hip replacement.  She was discharged from the hospital yesterday.  Patient reports he lives at home with her husband.  Patient reports that when she got home, she has not been able to take care of herself.  She states that her husband is not able to help either.  She states she has not been able to manage and states that she sometimes gets lightheaded when she walks.  She states that her hip is feeling good but she occasionally will have some pain to her thigh and knee.  She has been taking pain medication.  She called [facility] who advised her to come to the emergency department to speak with a case manager about possible SNF placement.  Patient states she has not had any fevers, chest pain, difficulty breathing, abdominal pain, nausea/weakness.  She states the right lower extremity has not been red hot or swollen.  The history is provided by the patient.       Past Medical History:  Diagnosis Date  . Atrial fibrillation ([place]) 08/14/2014  . Bell's palsy   . Bimalleolar fracture of left ankle   . Breast cancer ([place])   . Diabetes mellitus, type II ([place])   . Dysrhythmia    a-fib  . GERD (gastroesophageal reflux disease)   . Low back pain with sciatica   . Osteoarthritis   . Proliferative diabetic retinopathy of both eyes ([place])   . Restless legs 2012  . Sleep apnea    CPAP nightly    Patient Active Problem List   Diagnosis Date Noted  . S/P right total hip arthroplasty 05/09/2021  . Type 2 diabetes mellitus with diabetic chronic kidney disease ([place]) 05/08/2021  . Diabetic peripheral angiopathy ([place]) 05/07/2021  . Hip fracture ([place]) 05/07/2021  . Breast cancer in situ  04/17/2016  . Chronic kidney disease 04/17/2016  . Acute onset aura migraine 04/17/2016  . Fasciculation 04/17/2016  . Obstructive apnea 04/17/2016  . Restless leg 04/17/2016  . Long term current use of anticoagulant 05/30/2015  . Other long term (current) drug therapy 05/30/2015  . Atrial fibrillation ([place]) 08/14/2014  . Type II or unspecified type diabetes mellitus with peripheral circulatory disorders, uncontrolled(250.72) 05/15/2013  . Pains, foot 05/15/2013  . Diabetic peripheral neuropathy ([place]) 05/15/2013  . Morbid obesity ([place]) 05/15/2013  . Bilateral leg edema 05/15/2013  . Edema of foot 05/15/2013  . Diabetes mellitus ([place]) 05/15/2013  . Foot pain 05/15/2013  . Osteoarthritis   . Proliferative diabetic retinopathy of both eyes ([place])   . Bell's palsy     Past Surgical History:  Procedure Laterality Date  . both knees  2006  . both legs Bilateral 2006 and 2007  . cataractectomies    . JOINT REPLACEMENT     bil TKR and lt hip  . laser eye surgeries    . left hip replacement     . left knee replacement    . ORIF ANKLE FRACTURE Left 01/19/2016   Procedure: OPEN REDUCTION INTERNAL FIXATION (ORIF) LEFT ANKLE BIMALLEOLAR  FRACTURE;  Surgeon: Wylene Simmer, MD;  Location: [facility];  Service: Orthopedics;  Laterality: Left;  . pin removal left foot    .  r breast due to cancer  2011  . repair of left foot fracture    . right foot surgery     . right knee replacement     . TOTAL HIP ARTHROPLASTY  2009  . TOTAL HIP ARTHROPLASTY Right 05/09/2021   Procedure: TOTAL HIP ARTHROPLASTY ANTERIOR APPROACH;  Surgeon: Paralee Cancel, MD;  Location: [facility];  Service: Orthopedics;  Laterality: Right;  Marland Kitchen VAGINAL HYSTERECTOMY       OB History   No obstetric history on file.     Family History  Problem Relation Age of Onset  . Diabetes Father   . Heart disease Father   . Diabetes Paternal Aunt        3 aunts had DM  . Thyroid disease Maternal Grandmother   . Diabetes  Maternal Grandfather     Social History   Tobacco Use  . Smoking status: Never Smoker  . Smokeless tobacco: Never Used  Substance Use Topics  . Alcohol use: No  . Drug use: No    Home Medications Prior to Admission medications   Medication Sig Start Date End Date Taking? Authorizing Provider  apixaban (ELIQUIS) 5 MG TABS tablet Take 5 mg by mouth 2 (two) times daily.    [provider]  calcium carbonate (OS-CAL - DOSED IN MG OF ELEMENTAL CALCIUM) 1250 (500 Ca) MG tablet Take 1 tablet (500 mg of elemental calcium total) by mouth daily with breakfast. 05/12/21   Autry-Lott, Naaman Plummer, DO  Cholecalciferol 2000 UNITS TABS Take 2,000 Units by mouth every morning.    [provider]  dapagliflozin propanediol (FARXIGA) 10 MG TABS tablet Take 10 mg by mouth every morning.    [provider]  DULoxetine (CYMBALTA) 60 MG capsule Take 60 mg by mouth every morning. 05/02/21   [provider]  HYDROcodone-acetaminophen (NORCO/VICODIN) 5-325 MG tablet Take 1 tablet by mouth every 6 (six) hours as needed. 05/11/21   Paralee Cancel, MD  Insulin Glargine-Lixisenatide (SOLIQUA) 100-33 UNT-MCG/ML SOPN Inject 50 Units into the skin at bedtime.    [provider]  methocarbamol (ROBAXIN) 500 MG tablet Take 1 tablet (500 mg total) by mouth every 6 (six) hours as needed for muscle spasms. 05/11/21   Irving Copas, PA-C  metoprolol succinate (TOPROL-XL) 50 MG 24 hr tablet Take 25 mg by mouth every morning. 04/25/21   [provider]  Multiple Vitamin (MULTIVITAMIN WITH MINERALS) TABS tablet Take 1 tablet by mouth every morning.    [provider]  Olopatadine HCl (PATADAY OP) Place 1 drop into both eyes daily as needed (allergies/itching).    [provider]  Polyethyl Glycol-Propyl Glycol (SYSTANE) 0.4-0.3 % SOLN Place 1 drop into both eyes daily as needed (dry eyes).    [provider]  polyethylene glycol (MIRALAX / GLYCOLAX) 17 g  packet Take 17 g by mouth 2 (two) times daily. 05/11/21   Irving Copas, PA-C  Ropinirole HCl 6 MG TB24 Take 6 mg by mouth every morning. 04/28/21   [provider]  zolpidem (AMBIEN) 10 MG tablet Take 0.5 tablets (5 mg total) by mouth at bedtime. 05/12/21   Autry-Lott, Naaman Plummer, DO    Allergies    Sulfa antibiotics and Sulfasalazine  Review of Systems   Review of Systems  Constitutional: Negative for fever.  Respiratory: Negative for cough and shortness of breath.   Cardiovascular: Negative for chest pain.  Gastrointestinal: Negative for abdominal pain, nausea and vomiting.  Musculoskeletal:  Right hip pain  Neurological: Negative for weakness and numbness.  All other systems reviewed and are negative.   Physical Exam Updated Vital Signs BP (!) 125/59 (BP Location: Left Arm)   Pulse 86   Temp 98.4 F (36.9 C)   Resp 18   SpO2 99%   Physical Exam Vitals and nursing note reviewed.  Constitutional:      Appearance: Normal appearance. She is well-developed.  HENT:     Head: Normocephalic and atraumatic.  Eyes:     General: Lids are normal.     Conjunctiva/sclera: Conjunctivae normal.     Pupils: Pupils are equal, round, and reactive to light.  Cardiovascular:     Rate and Rhythm: Normal rate and regular rhythm.     Pulses: Normal pulses.          Radial pulses are 2+ on the right side and 2+ on the left side.       Dorsalis pedis pulses are 2+ on the right side and 2+ on the left side.     Heart sounds: Normal heart sounds. No murmur heard. No friction rub. No gallop.   Pulmonary:     Effort: Pulmonary effort is normal.     Breath sounds: Normal breath sounds.     Comments: Lungs clear to auscultation bilaterally.  Symmetric chest rise.  No wheezing, rales, rhonchi. Abdominal:     Palpations: Abdomen is soft. Abdomen is not rigid.     Tenderness: There is no abdominal tenderness. There is no guarding.     Comments: Abdomen is soft, non-distended,  non-tender. No rigidity, No guarding. No peritoneal signs.  Genitourinary:    Comments: The exam was performed with a chaperone present (April, RN). Digital Rectal Exam reveals sphincter with good tone. No external hemorrhoids. No masses or fissures. Stool color is brown with no overt blood. Musculoskeletal:        General: Normal range of motion.     Cervical back: Full passive range of motion without pain.     Comments: Right thigh with bandage in place.  No surrounding warmth, erythema. She does have a small amount of swelling around the RLE.  No deformity or crepitus noted.  Skin:    General: Skin is warm and dry.     Capillary Refill: Capillary refill takes less than 2 seconds.     Comments: Good distal cap refill. RLE is not dusky in appearance or cool to touch.  Neurological:     Mental Status: She is alert and oriented to person, place, and time.     Comments: Sensation intact along major nerve distributions of BLE 5/5 strength of BUE Limited movement of right lower extremity secondary to recent surgery.  5/5 strength of left lower extremity.  Psychiatric:        Speech: Speech normal.     ED Results / Procedures / Treatments   Labs (all labs ordered are listed, but only abnormal results are displayed) Labs Reviewed  CBC - Abnormal; Notable for the following components:      Result Value   RBC 3.34 (*)    Hemoglobin 10.3 (*)    HCT 32.0 (*)    All other components within normal limits  BASIC METABOLIC PANEL - Abnormal; Notable for the following components:   Glucose, Bld 232 (*)    BUN 35 (*)    Creatinine, Ser 1.49 (*)    Calcium 8.0 (*)    GFR, Estimated 36 (*)    All  other components within normal limits  RESP PANEL BY RT-PCR (FLU A&B, COVID) ARPGX2  URINALYSIS, ROUTINE W REFLEX MICROSCOPIC  POC OCCULT BLOOD, ED    EKG None  Radiology No results found.  Procedures Procedures   Medications Ordered in ED Medications  sodium chloride 0.9 % bolus 500 mL (0  mLs Intravenous Stopped 05/13/21 1502)    ED Course  I have reviewed the triage vital signs and the nursing notes.  Pertinent labs & imaging results that were available during my care of the patient were reviewed by me and considered in my medical decision making (see chart for details).    MDM Rules/Calculators/A&P                          75 year old female recent right hip replacement presents for evaluation of generalized weakness.  Patient with recent hip replacement and was discharged home.  She reports that at home, she is unable to care for herself.  She her husband uses a walker and is confused and cannot take care of her.  [facility] called [facility] and they advised her to come to the ED to get a case manager consult to see about SNF placement.  Patient states that the pain in her right thigh has been doing better.  No fevers, nausea/vomiting.  She reports occasionally, she will feel slightly lightheaded.  On initial arrival, she is afebrile, nontoxic-appearing.  Vital signs are stable.  On exam, right hip is without any signs of erythema, edema.  Benign abdominal exam.  We will plan to check labs.  I discussed with patient's [name]).  She states that patient has difficulty taking care of herself since this is happened.  She reports that her husband is confused and cannot help.  They are concerned about her going home.  COVID is negative. CBC shows hgb of 10.3. 6 days ago she was at a 15.4. Before she was discharged, it had dropped to 11.6. BMP shows glucose 232. BUN of 35 and Cr 1.49. Fecal occult is negative.   I suspect patient's anemia progressions from postop.  She is fecal occult negative.  She is hemodynamically stable.  At this time, do not feel that the anemia warrants admission.  Patient is pending a urine.  PT will evaluate patient.  Portions of this note were generated with [employer]. Dictation errors may occur despite best attempts at  proofreading.   Final Clinical Impression(s) / ED Diagnoses Final diagnoses:  Generalized weakness  Anemia, unspecified type    Rx / DC Orders ED Discharge Orders    None       Desma Mcgregor 05/13/21 1536    Little, Wenda Overland, MD 05/14/21 [phone]

## 2021-05-13 NOTE — ED Notes (Signed)
Cleaned pt up and put a new clean brief on

## 2021-05-13 NOTE — Evaluation (Signed)
Physical Therapy Evaluation Patient Details Name: Heather [name] MRN: [id number] DOB: December 31, 1945 Today's Date: 05/13/2021   History of Present Illness  75 y.o. female presents to [facility] ED on 05/13/2021 from home with reports of dizziness/lightheadedness with transfers and difficulty ambulating at home since discharge on 05/12/2021. Pt underwent R anterior THA on 05/09/2021 after experiencing hip fx. PMH of DM2, restless legs, diabetic retinopathy both eyes, OA, low back pain with sciatica, breast CA, L ankle fx s/p ORIF, a-fib, total hip arthroplasty in 2009, bil TKA.  Clinical Impression  Pt presents to PT with deficits in funcitonal mobility, gait, balance, power, strength, endurance, and limitations in mobility due to dizziness. Pt demonstrates orthostatic BP during this session with symptoms of dizziness, significantly limiting activity tolerance. Pt requires physical assistance and use of railings to mobilize in bed, as well as assistance for safety in other mobility activities. Pt expresses concern for discharge home as her spouse is unable to physically assist, and she is unable to mobilize well due to recent dizziness. PT recommends discharge to SNF to improve activity tolerance and reduce falls risk. Acute PT will continue to follow.    Follow Up Recommendations SNF    Equipment Recommendations  None recommended by PT    Recommendations for Other Services       Precautions / Restrictions Precautions Precautions: Fall Precaution Comments: pt incontinent of urine at baseline, wears briefs at home Restrictions Weight Bearing Restrictions: Yes RLE Weight Bearing: Weight bearing as tolerated      Mobility  Bed Mobility Overal bed mobility: Needs Assistance Bed Mobility: Supine to Sit;Sit to Supine     Supine to sit: Mod assist Sit to supine: Mod assist   General bed mobility comments: assist for LLE and to pivot at edge of bed    Transfers Overall transfer level: Needs  assistance Equipment used: Rolling walker (2 wheeled) Transfers: Sit to/from Stand Sit to Stand: Min guard         General transfer comment: minG for safety  Ambulation/Gait Ambulation/Gait assistance: Min guard Gait Distance (Feet): 6 Feet (tolerance limited by dizziness, 3' forward and 3' backward) Assistive device: Rolling walker (2 wheeled) Gait Pattern/deviations: Step-to pattern Gait velocity: reduced Gait velocity interpretation: <1.31 ft/sec, indicative of household ambulator General Gait Details: pt with slowed step-to gait, reduced step length and gait speed  Stairs            Wheelchair Mobility    Modified Rankin (Stroke Patients Only)       Balance Overall balance assessment: Needs assistance Sitting-balance support: Single extremity supported;Bilateral upper extremity supported;Feet unsupported Sitting balance-Leahy Scale: Poor Sitting balance - Comments: reliant on UE support of bed   Standing balance support: Bilateral upper extremity supported Standing balance-Leahy Scale: Poor Standing balance comment: needs support from RW                             Pertinent Vitals/Pain Pain Assessment: No/denies pain    Home Living Family/patient expects to be discharged to:: Private residence Living Arrangements: Spouse/significant other Available Help at Discharge: Family;Available 24 hours/day (supervision only, no physical assistance) Type of Home: House Home Access: Stairs to enter Entrance Stairs-Rails: Left Entrance Stairs-Number of Steps: 3 Home Layout: One level Home Equipment: Walker - 2 wheels;Bedside commode      Prior Function Level of Independence: Independent               Hand Dominance   Dominant  Hand: Right    Extremity/Trunk Assessment   Upper Extremity Assessment Upper Extremity Assessment: Overall WFL for tasks assessed    Lower Extremity Assessment Lower Extremity Assessment: Generalized weakness     Cervical / Trunk Assessment Cervical / Trunk Assessment: Other exceptions Cervical / Trunk Exceptions: h/o low back issues and sciatica  Communication   Communication: No difficulties  Cognition Arousal/Alertness: Awake/alert Behavior During Therapy: WFL for tasks assessed/performed Overall Cognitive Status: Within Functional Limits for tasks assessed                                        General Comments General comments (skin integrity, edema, etc.): BP in supine 133/66 (84), BP in sitting  115/67 (83) pt reporting mild dizziness, BP after short bout of ambulation 82/61 (66). BP upon return to supine 126/80 (69) with symptom resolution    Exercises     Assessment/Plan    PT Assessment Patient needs continued PT services  PT Problem List Decreased strength;Decreased activity tolerance;Decreased balance;Decreased mobility;Cardiopulmonary status limiting activity;Pain       PT Treatment Interventions DME instruction;Gait training;Stair training;Functional mobility training;Therapeutic activities;Therapeutic exercise;Balance training;Patient/family education;Modalities;Manual techniques    PT Goals (Current goals can be found in the Care Plan section)  Acute Rehab PT Goals Patient Stated Goal: to go to short term rehan, improve strength and activity tolerance in an effort to return to independent mobility PT Goal Formulation: With patient Time For Goal Achievement: 05/27/21 Potential to Achieve Goals: Good    Frequency Min 5X/week   Barriers to discharge        Co-evaluation               AM-PAC PT "6 Clicks" Mobility  Outcome Measure Help needed turning from your back to your side while in a flat bed without using bedrails?: A Little Help needed moving from lying on your back to sitting on the side of a flat bed without using bedrails?: A Lot Help needed moving to and from a bed to a chair (including a wheelchair)?: A Little Help needed standing  up from a chair using your arms (e.g., wheelchair or bedside chair)?: A Little Help needed to walk in hospital room?: A Little Help needed climbing 3-5 steps with a railing? : A Lot 6 Click Score: 16    End of Session   Activity Tolerance: Treatment limited secondary to medical complications (Comment) (orthostatic) Patient left: in bed (no call bell, in ED) Nurse Communication: Mobility status PT Visit Diagnosis: Muscle weakness (generalized) (M62.81);Difficulty in walking, not elsewhere classified (R26.2);Pain    Time: 7672-0947 PT Time Calculation (min) (ACUTE ONLY): 28 min   Charges:   PT Evaluation $PT Eval Low Complexity: 1 Low          Zenaida Niece, PT, DPT Acute Rehabilitation Pager: [phone]   Zenaida Niece 05/13/2021, 4:01 PM

## 2021-05-13 NOTE — ED Notes (Signed)
Patient incontinent of urine. Peri care performed, new brief applied.

## 2021-05-13 NOTE — ED Notes (Signed)
Patient requests that her daughter-in-law, Ebony Cargo, be added as her primary contact. Contact information added to patient demographics.

## 2021-05-13 NOTE — ED Notes (Signed)
PT assessment in progress

## 2021-05-14 LAB — CBG MONITORING, ED
Glucose-Capillary: 127 mg/dL — ABNORMAL HIGH (ref 70–99)
Glucose-Capillary: 157 mg/dL — ABNORMAL HIGH (ref 70–99)
Glucose-Capillary: 110 mg/dL — ABNORMAL HIGH (ref 70–99)

## 2021-05-14 MED ORDER — ROPINIROLE HCL 1 MG PO TABS
2.0000 mg | ORAL_TABLET | ORAL | Status: DC
  Administered 2021-05-14: 2 mg via ORAL
  Filled 2021-05-14 (×2): qty 2

## 2021-05-14 MED ORDER — DULOXETINE HCL 60 MG PO CPEP
60.0000 mg | ORAL_CAPSULE | Freq: Every morning | ORAL | Status: DC
  Administered 2021-05-14 – 2021-05-15 (×2): 60 mg via ORAL
  Filled 2021-05-14 (×2): qty 1

## 2021-05-14 MED ORDER — OLOPATADINE HCL 0.1 % OP SOLN
1.0000 [drp] | Freq: Every day | OPHTHALMIC | Status: DC | PRN
  Filled 2021-05-14: qty 5

## 2021-05-14 MED ORDER — ADULT MULTIVITAMIN W/MINERALS CH
1.0000 | ORAL_TABLET | Freq: Every morning | ORAL | Status: DC
  Administered 2021-05-14 – 2021-05-15 (×2): 1 via ORAL
  Filled 2021-05-14 (×2): qty 1

## 2021-05-14 MED ORDER — ROPINIROLE HCL ER 4 MG PO TB24
4.0000 mg | ORAL_TABLET | ORAL | Status: DC
  Administered 2021-05-14: 4 mg via ORAL
  Filled 2021-05-14 (×2): qty 1

## 2021-05-14 MED ORDER — ROPINIROLE HCL ER 6 MG PO TB24: 6.0000 mg | ORAL_TABLET | Freq: Every morning | ORAL | Status: DC

## 2021-05-14 MED ORDER — APIXABAN 5 MG PO TABS
5.0000 mg | ORAL_TABLET | Freq: Two times a day (BID) | ORAL | Status: DC
  Administered 2021-05-14 – 2021-05-15 (×3): 5 mg via ORAL
  Filled 2021-05-14 (×3): qty 1

## 2021-05-14 MED ORDER — INSULIN GLARGINE 100 UNIT/ML ~~LOC~~ SOLN
50.0000 [IU] | Freq: Every day | SUBCUTANEOUS | Status: DC
  Administered 2021-05-15: 50 [IU] via SUBCUTANEOUS
  Filled 2021-05-14 (×3): qty 0.5

## 2021-05-14 MED ORDER — ZOLPIDEM TARTRATE 5 MG PO TABS
5.0000 mg | ORAL_TABLET | Freq: Every day | ORAL | Status: DC
  Administered 2021-05-15: 5 mg via ORAL
  Filled 2021-05-14: qty 1

## 2021-05-14 MED ORDER — INSULIN ASPART 100 UNIT/ML IJ SOLN
0.0000 [IU] | INTRAMUSCULAR | Status: DC
  Administered 2021-05-14: 2 [IU] via SUBCUTANEOUS
  Administered 2021-05-15: 1 [IU] via SUBCUTANEOUS

## 2021-05-14 MED ORDER — METOPROLOL SUCCINATE ER 25 MG PO TB24
25.0000 mg | ORAL_TABLET | Freq: Every morning | ORAL | Status: DC
  Administered 2021-05-14 – 2021-05-15 (×2): 25 mg via ORAL
  Filled 2021-05-14 (×2): qty 1

## 2021-05-14 MED ORDER — POLYETHYLENE GLYCOL 3350 17 G PO PACK
17.0000 g | PACK | Freq: Two times a day (BID) | ORAL | Status: DC
  Filled 2021-05-14: qty 1

## 2021-05-14 MED ORDER — DAPAGLIFLOZIN PROPANEDIOL 10 MG PO TABS
10.0000 mg | ORAL_TABLET | Freq: Every morning | ORAL | Status: DC
  Administered 2021-05-14 – 2021-05-15 (×2): 10 mg via ORAL
  Filled 2021-05-14 (×2): qty 1

## 2021-05-14 MED ORDER — VITAMIN D 25 MCG (1000 UNIT) PO TABS
2000.0000 [IU] | ORAL_TABLET | Freq: Every morning | ORAL | Status: DC
  Administered 2021-05-14 – 2021-05-15 (×2): 2000 [IU] via ORAL
  Filled 2021-05-14 (×2): qty 2

## 2021-05-14 MED ORDER — ROSUVASTATIN CALCIUM 20 MG PO TABS
40.0000 mg | ORAL_TABLET | Freq: Every day | ORAL | Status: DC
  Administered 2021-05-15: 40 mg via ORAL
  Filled 2021-05-14: qty 2

## 2021-05-14 MED ORDER — CALCIUM CARBONATE 1250 (500 CA) MG PO TABS: 1.0000 | ORAL_TABLET | Freq: Every day | ORAL | Status: DC

## 2021-05-14 NOTE — Progress Notes (Signed)
Physical Therapy Treatment Patient Details Name: Heather [name] MRN: [id number] DOB: 1946-04-16 Today's Date: 05/14/2021    History of Present Illness 75 y.o. female presents to [facility] ED on 05/13/2021 from home with reports of dizziness/lightheadedness with transfers and difficulty ambulating at home since discharge on 05/12/2021. Pt underwent R anterior THA on 05/09/2021 after experiencing hip fx. PMH of DM2, restless legs, diabetic retinopathy both eyes, OA, low back pain with sciatica, breast CA, L ankle fx s/p ORIF, a-fib, total hip arthroplasty in 2009, bil TKA.    PT Comments    Pt progressing towards goals, however, continues to be limited secondary to low BP. BP at upper 90s over mid 50s following mobility. Required mod A for bed mobility and min-min guard A for transfers and taking side steps at EOB. Reviewed HEP with pt. Current recommendations for SNF remain appropriate. Will continue to follow acutely.     Follow Up Recommendations  SNF     Equipment Recommendations  None recommended by PT    Recommendations for Other Services       Precautions / Restrictions Precautions Precautions: Fall Precaution Comments: pt incontinent of urine at baseline, wears briefs at home Restrictions Weight Bearing Restrictions: Yes RLE Weight Bearing: Weight bearing as tolerated    Mobility  Bed Mobility Overal bed mobility: Needs Assistance Bed Mobility: Supine to Sit;Sit to Supine     Supine to sit: Mod assist Sit to supine: Min assist   General bed mobility comments: Mod A for trunk elevation and assist to scoot hips EOB. Min A for RLE assist to return to supine.    Transfers Overall transfer level: Needs assistance Equipment used: Rolling walker (2 wheeled) Transfers: Sit to/from Stand Sit to Stand: Min assist         General transfer comment: Min A for lift assist and steadying.  Ambulation/Gait Ambulation/Gait assistance: Min guard   Assistive device: Rolling walker (2  wheeled) Gait Pattern/deviations: Step-to pattern Gait velocity: Decreased   General Gait Details: Took side steps at EOB. Mobility limited secondary to low BP. BP at upper 90s over mid 50s.   Stairs             Wheelchair Mobility    Modified Rankin (Stroke Patients Only)       Balance Overall balance assessment: Needs assistance Sitting-balance support: Single extremity supported;Bilateral upper extremity supported;Feet unsupported Sitting balance-Leahy Scale: Poor Sitting balance - Comments: reliant on UE support of bed   Standing balance support: Bilateral upper extremity supported Standing balance-Leahy Scale: Poor Standing balance comment: needs support from RW                            Cognition Arousal/Alertness: Awake/alert Behavior During Therapy: WFL for tasks assessed/performed Overall Cognitive Status: Within Functional Limits for tasks assessed                                        Exercises Total Joint Exercises Ankle Circles/Pumps: AROM;Both;10 reps;Supine Heel Slides: AAROM;Right;10 reps;Supine Long Arc Quad: AROM;Right;10 reps;Seated    General Comments        Pertinent Vitals/Pain Pain Assessment: Faces Faces Pain Scale: Hurts a little bit Pain Location: R hip Pain Descriptors / Indicators: Grimacing;Guarding Pain Intervention(s): Monitored during session;Limited activity within patient's tolerance;Repositioned    Home Living  Prior Function            PT Goals (current goals can now be found in the care plan section) Acute Rehab PT Goals Patient Stated Goal: to go to short term rehan, improve strength and activity tolerance in an effort to return to independent mobility PT Goal Formulation: With patient Time For Goal Achievement: 05/27/21 Potential to Achieve Goals: Good Progress towards PT goals: Progressing toward goals    Frequency    Min 5X/week      PT  Plan Current plan remains appropriate    Co-evaluation              AM-PAC PT "6 Clicks" Mobility   Outcome Measure  Help needed turning from your back to your side while in a flat bed without using bedrails?: A Little Help needed moving from lying on your back to sitting on the side of a flat bed without using bedrails?: A Lot Help needed moving to and from a bed to a chair (including a wheelchair)?: A Little Help needed standing up from a chair using your arms (e.g., wheelchair or bedside chair)?: A Little Help needed to walk in hospital room?: A Little Help needed climbing 3-5 steps with a railing? : A Lot 6 Click Score: 16    End of Session   Activity Tolerance: Treatment limited secondary to medical complications (Comment) (orthostatic) Patient left: in bed (in ED on bed) Nurse Communication: Mobility status PT Visit Diagnosis: Muscle weakness (generalized) (M62.81);Difficulty in walking, not elsewhere classified (R26.2);Pain Pain - Right/Left: Right Pain - part of body: Hip     Time: 1610-9604 PT Time Calculation (min) (ACUTE ONLY): 19 min  Charges:  $Therapeutic Activity: 8-22 mins                     Lou Miner, DPT  Acute Rehabilitation Services  Pager: [phone] Office: [phone]    Rudean Hitt 05/14/2021, 7:57 AM

## 2021-05-15 DIAGNOSIS — K5900 Constipation, unspecified: Secondary | ICD-10-CM | POA: Diagnosis not present

## 2021-05-15 DIAGNOSIS — I951 Orthostatic hypotension: Secondary | ICD-10-CM | POA: Diagnosis not present

## 2021-05-15 DIAGNOSIS — S72041S Displaced fracture of base of neck of right femur, sequela: Secondary | ICD-10-CM | POA: Diagnosis not present

## 2021-05-15 DIAGNOSIS — R531 Weakness: Secondary | ICD-10-CM | POA: Diagnosis not present

## 2021-05-15 DIAGNOSIS — D649 Anemia, unspecified: Secondary | ICD-10-CM | POA: Diagnosis not present

## 2021-05-15 DIAGNOSIS — M25551 Pain in right hip: Secondary | ICD-10-CM | POA: Diagnosis not present

## 2021-05-15 DIAGNOSIS — E1122 Type 2 diabetes mellitus with diabetic chronic kidney disease: Secondary | ICD-10-CM | POA: Diagnosis not present

## 2021-05-15 DIAGNOSIS — S72041A Displaced fracture of base of neck of right femur, initial encounter for closed fracture: Secondary | ICD-10-CM | POA: Diagnosis not present

## 2021-05-15 DIAGNOSIS — F331 Major depressive disorder, recurrent, moderate: Secondary | ICD-10-CM | POA: Diagnosis not present

## 2021-05-15 DIAGNOSIS — D0510 Intraductal carcinoma in situ of unspecified breast: Secondary | ICD-10-CM | POA: Diagnosis not present

## 2021-05-15 DIAGNOSIS — F418 Other specified anxiety disorders: Secondary | ICD-10-CM | POA: Diagnosis not present

## 2021-05-15 DIAGNOSIS — B001 Herpesviral vesicular dermatitis: Secondary | ICD-10-CM | POA: Diagnosis not present

## 2021-05-15 DIAGNOSIS — F3341 Major depressive disorder, recurrent, in partial remission: Secondary | ICD-10-CM | POA: Diagnosis not present

## 2021-05-15 DIAGNOSIS — F5101 Primary insomnia: Secondary | ICD-10-CM | POA: Diagnosis not present

## 2021-05-15 DIAGNOSIS — G4709 Other insomnia: Secondary | ICD-10-CM | POA: Diagnosis not present

## 2021-05-15 DIAGNOSIS — G4733 Obstructive sleep apnea (adult) (pediatric): Secondary | ICD-10-CM | POA: Diagnosis not present

## 2021-05-15 DIAGNOSIS — S72001D Fracture of unspecified part of neck of right femur, subsequent encounter for closed fracture with routine healing: Secondary | ICD-10-CM | POA: Diagnosis not present

## 2021-05-15 DIAGNOSIS — I959 Hypotension, unspecified: Secondary | ICD-10-CM | POA: Diagnosis not present

## 2021-05-15 DIAGNOSIS — R0782 Intercostal pain: Secondary | ICD-10-CM | POA: Diagnosis not present

## 2021-05-15 DIAGNOSIS — G2581 Restless legs syndrome: Secondary | ICD-10-CM | POA: Diagnosis not present

## 2021-05-15 DIAGNOSIS — I4892 Unspecified atrial flutter: Secondary | ICD-10-CM | POA: Diagnosis not present

## 2021-05-15 DIAGNOSIS — E785 Hyperlipidemia, unspecified: Secondary | ICD-10-CM | POA: Diagnosis not present

## 2021-05-15 DIAGNOSIS — N189 Chronic kidney disease, unspecified: Secondary | ICD-10-CM | POA: Diagnosis not present

## 2021-05-15 DIAGNOSIS — Z96641 Presence of right artificial hip joint: Secondary | ICD-10-CM | POA: Diagnosis not present

## 2021-05-15 DIAGNOSIS — H04123 Dry eye syndrome of bilateral lacrimal glands: Secondary | ICD-10-CM | POA: Diagnosis not present

## 2021-05-15 DIAGNOSIS — I10 Essential (primary) hypertension: Secondary | ICD-10-CM | POA: Diagnosis not present

## 2021-05-15 DIAGNOSIS — E119 Type 2 diabetes mellitus without complications: Secondary | ICD-10-CM | POA: Diagnosis not present

## 2021-05-15 DIAGNOSIS — R42 Dizziness and giddiness: Secondary | ICD-10-CM | POA: Diagnosis not present

## 2021-05-15 LAB — CBG MONITORING, ED
Glucose-Capillary: 126 mg/dL — ABNORMAL HIGH (ref 70–99)
Glucose-Capillary: 127 mg/dL — ABNORMAL HIGH (ref 70–99)
Glucose-Capillary: 187 mg/dL — ABNORMAL HIGH (ref 70–99)
Glucose-Capillary: 57 mg/dL — ABNORMAL LOW (ref 70–99)
Glucose-Capillary: 62 mg/dL — ABNORMAL LOW (ref 70–99)

## 2021-05-15 MED ORDER — ROPINIROLE HCL 1 MG PO TABS
2.0000 mg | ORAL_TABLET | Freq: Three times a day (TID) | ORAL | Status: DC
  Administered 2021-05-15 (×2): 2 mg via ORAL
  Filled 2021-05-15 (×3): qty 2

## 2021-05-15 NOTE — ED Notes (Addendum)
Report received. Care assumed. Breakfast tray ordered/pending. Sandwich and soda given. Pt alert, NAD, calm interactive.

## 2021-05-15 NOTE — Progress Notes (Signed)
Authorization approved, [id number]  Ambulance transportation approved, [id number]

## 2021-05-15 NOTE — ED Notes (Signed)
Pt ambulated from chair back to bed with standby assist. Tolerated well. Pt resting comfortably with call bell within reach.

## 2021-05-15 NOTE — Progress Notes (Signed)
Pt is going to [facility] room [place]  # for report [phone]

## 2021-05-15 NOTE — Progress Notes (Signed)
Inpatient Diabetes Program Recommendations  AACE/ADA: New Consensus Statement on Inpatient Glycemic Control (2015)  Target Ranges:  Prepandial:   less than 140 mg/dL      Peak postprandial:   less than 180 mg/dL (1-2 hours)      Critically ill patients:  140 - 180 mg/dL   Lab Results  Component Value Date   GLUCAP 127 (H) 05/15/2021   HGBA1C 6.9 (H) 05/08/2021    Review of Glycemic Control Results for Heather [name], Heather [name] (MRN [id number]) as of 05/15/2021 11:59  Ref. Range 05/15/2021 03:57 05/15/2021 07:15 05/15/2021 10:56  Glucose-Capillary Latest Ref Range: 70 - 99 mg/dL 57 (L) 62 (L) 127 (H)    Diabetes history: Type 2 DM Outpatient Diabetes medications: Soliqua 50 units QHS, Faxiga 10 mg QD Current orders for Inpatient glycemic control: Lantus 50 untis QD, Novolog 0-9 units Q4H,  Farxiga 10 mg QD  Inpatient Diabetes Program Recommendations:    Noted hypoglycemia this AM of 57 mg/dL, consider changing correction to Novolog 0-9 units TID and slight decrease to Lantus to 48 units QHS.   Thanks, Bronson Curb, MSN, RNC-OB Diabetes Coordinator [phone] (8a-5p)

## 2021-05-15 NOTE — ED Notes (Signed)
Up to b/r by rolling walker. Back by recliner. Remains alert, NAD, calm, interactive, resps e/u.

## 2021-05-15 NOTE — Anesthesia Postprocedure Evaluation (Signed)
Anesthesia Post Note  Patient: Heather [name]  Procedure(s) Performed: TOTAL HIP ARTHROPLASTY ANTERIOR APPROACH (Right Hip)     Patient location during evaluation: PACU Anesthesia Type: General Level of consciousness: awake and alert Pain management: pain level controlled Vital Signs Assessment: post-procedure vital signs reviewed and stable Respiratory status: spontaneous breathing, nonlabored ventilation, respiratory function stable and patient connected to nasal cannula oxygen Cardiovascular status: blood pressure returned to baseline and stable Postop Assessment: no apparent nausea or vomiting Anesthetic complications: no   No complications documented.  Last Vitals:  Vitals:   05/12/21 0418 05/12/21 1308  BP: 128/64 134/78  Pulse: 69 79  Resp: 16 18  Temp: 36.9 C 36.7 C  SpO2: 96% 100%    Last Pain:  Vitals:   05/12/21 1308  TempSrc: Oral  PainSc:                  Resean Brander

## 2021-05-15 NOTE — ED Notes (Signed)
Pt glucose 127

## 2021-05-15 NOTE — ED Notes (Signed)
Called [facility]. Pt 4th in line on list. Pt updated.

## 2021-05-15 NOTE — ED Notes (Signed)
Updated. Pending bed assignment and receiving RN for report. Denies questions or needs. Denies need for pain meds. Lying in stretcher, alert, NAD, calm, interactive. Declining/refusing miralax and insulin injections.

## 2021-05-15 NOTE — ED Notes (Signed)
Lunch tray delivered.

## 2021-05-15 NOTE — Progress Notes (Signed)
Patient selected Heather [name]. Insurance Josem Kaufmann was started

## 2021-05-15 NOTE — ED Notes (Signed)
4oz juice given for low glucose

## 2021-05-15 NOTE — ED Provider Notes (Signed)
Emergency Medicine Observation Re-evaluation Note  Heather [name] is a 75 y.o. female, seen on rounds today.  Pt initially presented to the ED for complaints of recent hip surgery and needing rehab placement.  Physical Exam  BP (!) 112/58 (BP Location: Right Arm)   Pulse (!) 59   Temp 98.6 F (37 C) (Oral)   Resp 18   SpO2 98%  Physical Exam General: alert, content.  Cardiac: regular rate.  Lungs: breathing comfortably.   ED Course / MDM  EKG:   I have reviewed the labs performed to date as well as medications administered while in observation.  Recent changes in the last 24 hours include TOC evaluation and placement efforts.   Plan  Current plan is for placement to rehab/snf.      Lajean Saver, MD 05/15/21 [phone]

## 2021-05-15 NOTE — Discharge Instructions (Addendum)
Return for any problem.  ?

## 2021-05-15 NOTE — ED Notes (Signed)
Pt self checked glucose, 75

## 2021-05-15 NOTE — ED Notes (Signed)
[facility] called, patient is 8th on the list

## 2021-05-16 ENCOUNTER — Encounter (HOSPITAL_COMMUNITY): Payer: Self-pay

## 2021-05-16 DIAGNOSIS — I10 Essential (primary) hypertension: Secondary | ICD-10-CM | POA: Diagnosis not present

## 2021-05-16 DIAGNOSIS — S72001D Fracture of unspecified part of neck of right femur, subsequent encounter for closed fracture with routine healing: Secondary | ICD-10-CM | POA: Diagnosis not present

## 2021-05-16 DIAGNOSIS — B001 Herpesviral vesicular dermatitis: Secondary | ICD-10-CM | POA: Diagnosis not present

## 2021-05-16 DIAGNOSIS — E119 Type 2 diabetes mellitus without complications: Secondary | ICD-10-CM | POA: Diagnosis not present

## 2021-05-17 DIAGNOSIS — F5101 Primary insomnia: Secondary | ICD-10-CM | POA: Diagnosis not present

## 2021-05-17 DIAGNOSIS — F331 Major depressive disorder, recurrent, moderate: Secondary | ICD-10-CM | POA: Diagnosis not present

## 2021-05-18 DIAGNOSIS — E1122 Type 2 diabetes mellitus with diabetic chronic kidney disease: Secondary | ICD-10-CM | POA: Diagnosis not present

## 2021-05-18 DIAGNOSIS — R42 Dizziness and giddiness: Secondary | ICD-10-CM | POA: Diagnosis not present

## 2021-05-18 DIAGNOSIS — M25551 Pain in right hip: Secondary | ICD-10-CM | POA: Diagnosis not present

## 2021-05-18 DIAGNOSIS — I959 Hypotension, unspecified: Secondary | ICD-10-CM | POA: Diagnosis not present

## 2021-05-19 DIAGNOSIS — S72041A Displaced fracture of base of neck of right femur, initial encounter for closed fracture: Secondary | ICD-10-CM | POA: Diagnosis not present

## 2021-05-22 DIAGNOSIS — F3341 Major depressive disorder, recurrent, in partial remission: Secondary | ICD-10-CM | POA: Diagnosis not present

## 2021-05-25 DIAGNOSIS — E1122 Type 2 diabetes mellitus with diabetic chronic kidney disease: Secondary | ICD-10-CM | POA: Diagnosis not present

## 2021-05-25 DIAGNOSIS — S72041S Displaced fracture of base of neck of right femur, sequela: Secondary | ICD-10-CM | POA: Diagnosis not present

## 2021-05-25 DIAGNOSIS — R0782 Intercostal pain: Secondary | ICD-10-CM | POA: Diagnosis not present

## 2021-05-26 DIAGNOSIS — E1122 Type 2 diabetes mellitus with diabetic chronic kidney disease: Secondary | ICD-10-CM | POA: Diagnosis not present

## 2021-05-26 DIAGNOSIS — S72041S Displaced fracture of base of neck of right femur, sequela: Secondary | ICD-10-CM | POA: Diagnosis not present

## 2021-05-26 DIAGNOSIS — I951 Orthostatic hypotension: Secondary | ICD-10-CM | POA: Diagnosis not present

## 2021-05-26 DIAGNOSIS — R531 Weakness: Secondary | ICD-10-CM | POA: Diagnosis not present

## 2021-05-26 DIAGNOSIS — E119 Type 2 diabetes mellitus without complications: Secondary | ICD-10-CM | POA: Diagnosis not present

## 2021-06-05 DIAGNOSIS — S72001A Fracture of unspecified part of neck of right femur, initial encounter for closed fracture: Secondary | ICD-10-CM | POA: Diagnosis not present

## 2021-06-16 ENCOUNTER — Encounter (INDEPENDENT_AMBULATORY_CARE_PROVIDER_SITE_OTHER): Payer: PPO | Admitting: Ophthalmology

## 2021-06-21 DIAGNOSIS — Z96641 Presence of right artificial hip joint: Secondary | ICD-10-CM | POA: Diagnosis not present

## 2021-06-21 DIAGNOSIS — Z471 Aftercare following joint replacement surgery: Secondary | ICD-10-CM | POA: Diagnosis not present

## 2021-06-30 ENCOUNTER — Encounter (INDEPENDENT_AMBULATORY_CARE_PROVIDER_SITE_OTHER): Payer: PPO | Admitting: Ophthalmology

## 2021-07-09 DIAGNOSIS — E1129 Type 2 diabetes mellitus with other diabetic kidney complication: Secondary | ICD-10-CM | POA: Diagnosis not present

## 2021-07-09 DIAGNOSIS — E1121 Type 2 diabetes mellitus with diabetic nephropathy: Secondary | ICD-10-CM | POA: Diagnosis not present

## 2021-07-09 DIAGNOSIS — I482 Chronic atrial fibrillation, unspecified: Secondary | ICD-10-CM | POA: Diagnosis not present

## 2021-07-11 DIAGNOSIS — B009 Herpesviral infection, unspecified: Secondary | ICD-10-CM | POA: Diagnosis not present

## 2021-07-21 ENCOUNTER — Other Ambulatory Visit: Payer: Self-pay

## 2021-07-21 ENCOUNTER — Encounter (INDEPENDENT_AMBULATORY_CARE_PROVIDER_SITE_OTHER): Payer: PPO | Admitting: Ophthalmology

## 2021-07-21 DIAGNOSIS — E113391 Type 2 diabetes mellitus with moderate nonproliferative diabetic retinopathy without macular edema, right eye: Secondary | ICD-10-CM | POA: Diagnosis not present

## 2021-07-21 DIAGNOSIS — E113592 Type 2 diabetes mellitus with proliferative diabetic retinopathy without macular edema, left eye: Secondary | ICD-10-CM

## 2021-07-21 DIAGNOSIS — H43813 Vitreous degeneration, bilateral: Secondary | ICD-10-CM

## 2021-07-21 DIAGNOSIS — H353132 Nonexudative age-related macular degeneration, bilateral, intermediate dry stage: Secondary | ICD-10-CM

## 2021-07-27 DIAGNOSIS — D492 Neoplasm of unspecified behavior of bone, soft tissue, and skin: Secondary | ICD-10-CM | POA: Diagnosis not present

## 2021-07-28 ENCOUNTER — Other Ambulatory Visit: Payer: Self-pay

## 2021-07-28 ENCOUNTER — Encounter (INDEPENDENT_AMBULATORY_CARE_PROVIDER_SITE_OTHER): Payer: PPO | Admitting: Ophthalmology

## 2021-07-28 DIAGNOSIS — E113592 Type 2 diabetes mellitus with proliferative diabetic retinopathy without macular edema, left eye: Secondary | ICD-10-CM | POA: Diagnosis not present

## 2021-08-02 DIAGNOSIS — Z96641 Presence of right artificial hip joint: Secondary | ICD-10-CM | POA: Diagnosis not present

## 2021-08-02 DIAGNOSIS — Z471 Aftercare following joint replacement surgery: Secondary | ICD-10-CM | POA: Diagnosis not present

## 2021-08-09 DIAGNOSIS — E1142 Type 2 diabetes mellitus with diabetic polyneuropathy: Secondary | ICD-10-CM | POA: Diagnosis not present

## 2021-08-09 DIAGNOSIS — N1832 Chronic kidney disease, stage 3b: Secondary | ICD-10-CM | POA: Diagnosis not present

## 2021-08-09 DIAGNOSIS — I482 Chronic atrial fibrillation, unspecified: Secondary | ICD-10-CM | POA: Diagnosis not present

## 2021-08-16 DIAGNOSIS — R922 Inconclusive mammogram: Secondary | ICD-10-CM | POA: Diagnosis not present

## 2021-08-16 DIAGNOSIS — N6311 Unspecified lump in the right breast, upper outer quadrant: Secondary | ICD-10-CM | POA: Diagnosis not present

## 2021-08-28 DIAGNOSIS — G2581 Restless legs syndrome: Secondary | ICD-10-CM | POA: Diagnosis not present

## 2021-08-28 DIAGNOSIS — I4891 Unspecified atrial fibrillation: Secondary | ICD-10-CM | POA: Diagnosis not present

## 2021-08-28 DIAGNOSIS — G4733 Obstructive sleep apnea (adult) (pediatric): Secondary | ICD-10-CM | POA: Diagnosis not present

## 2021-08-28 DIAGNOSIS — F324 Major depressive disorder, single episode, in partial remission: Secondary | ICD-10-CM | POA: Diagnosis not present

## 2021-08-28 DIAGNOSIS — Z6833 Body mass index (BMI) 33.0-33.9, adult: Secondary | ICD-10-CM | POA: Diagnosis not present

## 2021-08-28 DIAGNOSIS — E1121 Type 2 diabetes mellitus with diabetic nephropathy: Secondary | ICD-10-CM | POA: Diagnosis not present

## 2021-08-28 DIAGNOSIS — E7800 Pure hypercholesterolemia, unspecified: Secondary | ICD-10-CM | POA: Diagnosis not present

## 2021-09-07 DIAGNOSIS — Z8616 Personal history of COVID-19: Secondary | ICD-10-CM | POA: Diagnosis not present

## 2021-09-07 DIAGNOSIS — I340 Nonrheumatic mitral (valve) insufficiency: Secondary | ICD-10-CM | POA: Diagnosis not present

## 2021-09-07 DIAGNOSIS — E669 Obesity, unspecified: Secondary | ICD-10-CM | POA: Diagnosis not present

## 2021-09-07 DIAGNOSIS — Z9989 Dependence on other enabling machines and devices: Secondary | ICD-10-CM | POA: Diagnosis not present

## 2021-09-07 DIAGNOSIS — I129 Hypertensive chronic kidney disease with stage 1 through stage 4 chronic kidney disease, or unspecified chronic kidney disease: Secondary | ICD-10-CM | POA: Diagnosis not present

## 2021-09-07 DIAGNOSIS — E1122 Type 2 diabetes mellitus with diabetic chronic kidney disease: Secondary | ICD-10-CM | POA: Diagnosis not present

## 2021-09-07 DIAGNOSIS — N1832 Chronic kidney disease, stage 3b: Secondary | ICD-10-CM | POA: Diagnosis not present

## 2021-09-07 DIAGNOSIS — I5030 Unspecified diastolic (congestive) heart failure: Secondary | ICD-10-CM | POA: Diagnosis not present

## 2021-09-07 DIAGNOSIS — Z7901 Long term (current) use of anticoagulants: Secondary | ICD-10-CM | POA: Diagnosis not present

## 2021-09-07 DIAGNOSIS — Z6833 Body mass index (BMI) 33.0-33.9, adult: Secondary | ICD-10-CM | POA: Diagnosis not present

## 2021-09-07 DIAGNOSIS — I4821 Permanent atrial fibrillation: Secondary | ICD-10-CM | POA: Diagnosis not present

## 2021-09-08 DIAGNOSIS — N1832 Chronic kidney disease, stage 3b: Secondary | ICD-10-CM | POA: Diagnosis not present

## 2021-09-08 DIAGNOSIS — E1142 Type 2 diabetes mellitus with diabetic polyneuropathy: Secondary | ICD-10-CM | POA: Diagnosis not present

## 2021-09-08 DIAGNOSIS — I482 Chronic atrial fibrillation, unspecified: Secondary | ICD-10-CM | POA: Diagnosis not present

## 2021-09-19 DIAGNOSIS — F332 Major depressive disorder, recurrent severe without psychotic features: Secondary | ICD-10-CM | POA: Diagnosis not present

## 2021-10-09 DIAGNOSIS — N184 Chronic kidney disease, stage 4 (severe): Secondary | ICD-10-CM | POA: Diagnosis not present

## 2021-10-09 DIAGNOSIS — E7800 Pure hypercholesterolemia, unspecified: Secondary | ICD-10-CM | POA: Diagnosis not present

## 2021-10-09 DIAGNOSIS — E1129 Type 2 diabetes mellitus with other diabetic kidney complication: Secondary | ICD-10-CM | POA: Diagnosis not present

## 2021-10-09 DIAGNOSIS — F324 Major depressive disorder, single episode, in partial remission: Secondary | ICD-10-CM | POA: Diagnosis not present

## 2021-10-09 DIAGNOSIS — E1121 Type 2 diabetes mellitus with diabetic nephropathy: Secondary | ICD-10-CM | POA: Diagnosis not present

## 2021-10-09 DIAGNOSIS — I482 Chronic atrial fibrillation, unspecified: Secondary | ICD-10-CM | POA: Diagnosis not present

## 2021-10-09 DIAGNOSIS — I5032 Chronic diastolic (congestive) heart failure: Secondary | ICD-10-CM | POA: Diagnosis not present

## 2021-10-09 DIAGNOSIS — F331 Major depressive disorder, recurrent, moderate: Secondary | ICD-10-CM | POA: Diagnosis not present

## 2021-10-09 DIAGNOSIS — M810 Age-related osteoporosis without current pathological fracture: Secondary | ICD-10-CM | POA: Diagnosis not present

## 2021-10-31 DIAGNOSIS — Z23 Encounter for immunization: Secondary | ICD-10-CM | POA: Diagnosis not present

## 2021-11-08 DIAGNOSIS — F329 Major depressive disorder, single episode, unspecified: Secondary | ICD-10-CM | POA: Diagnosis not present

## 2021-11-08 DIAGNOSIS — E1129 Type 2 diabetes mellitus with other diabetic kidney complication: Secondary | ICD-10-CM | POA: Diagnosis not present

## 2021-11-08 DIAGNOSIS — I482 Chronic atrial fibrillation, unspecified: Secondary | ICD-10-CM | POA: Diagnosis not present

## 2021-11-08 DIAGNOSIS — E1121 Type 2 diabetes mellitus with diabetic nephropathy: Secondary | ICD-10-CM | POA: Diagnosis not present

## 2021-11-20 DIAGNOSIS — M545 Low back pain, unspecified: Secondary | ICD-10-CM | POA: Diagnosis not present

## 2021-11-20 DIAGNOSIS — E1121 Type 2 diabetes mellitus with diabetic nephropathy: Secondary | ICD-10-CM | POA: Diagnosis not present

## 2021-11-20 DIAGNOSIS — Z6833 Body mass index (BMI) 33.0-33.9, adult: Secondary | ICD-10-CM | POA: Diagnosis not present

## 2021-11-20 DIAGNOSIS — I4891 Unspecified atrial fibrillation: Secondary | ICD-10-CM | POA: Diagnosis not present

## 2021-11-20 DIAGNOSIS — F324 Major depressive disorder, single episode, in partial remission: Secondary | ICD-10-CM | POA: Diagnosis not present

## 2021-11-22 DIAGNOSIS — E1121 Type 2 diabetes mellitus with diabetic nephropathy: Secondary | ICD-10-CM | POA: Diagnosis not present

## 2021-11-27 ENCOUNTER — Encounter (INDEPENDENT_AMBULATORY_CARE_PROVIDER_SITE_OTHER): Payer: PPO | Admitting: Ophthalmology

## 2021-12-08 DIAGNOSIS — E1129 Type 2 diabetes mellitus with other diabetic kidney complication: Secondary | ICD-10-CM | POA: Diagnosis not present

## 2021-12-08 DIAGNOSIS — E1121 Type 2 diabetes mellitus with diabetic nephropathy: Secondary | ICD-10-CM | POA: Diagnosis not present

## 2021-12-08 DIAGNOSIS — F331 Major depressive disorder, recurrent, moderate: Secondary | ICD-10-CM | POA: Diagnosis not present

## 2022-01-07 DIAGNOSIS — E1142 Type 2 diabetes mellitus with diabetic polyneuropathy: Secondary | ICD-10-CM | POA: Diagnosis not present

## 2022-01-07 DIAGNOSIS — N1832 Chronic kidney disease, stage 3b: Secondary | ICD-10-CM | POA: Diagnosis not present

## 2022-01-08 ENCOUNTER — Encounter (INDEPENDENT_AMBULATORY_CARE_PROVIDER_SITE_OTHER): Payer: PPO | Admitting: Ophthalmology

## 2022-01-31 ENCOUNTER — Encounter (INDEPENDENT_AMBULATORY_CARE_PROVIDER_SITE_OTHER): Payer: PPO | Admitting: Ophthalmology

## 2022-02-05 DIAGNOSIS — Z1231 Encounter for screening mammogram for malignant neoplasm of breast: Secondary | ICD-10-CM | POA: Diagnosis not present

## 2022-02-06 DIAGNOSIS — E1142 Type 2 diabetes mellitus with diabetic polyneuropathy: Secondary | ICD-10-CM | POA: Diagnosis not present

## 2022-02-06 DIAGNOSIS — N1832 Chronic kidney disease, stage 3b: Secondary | ICD-10-CM | POA: Diagnosis not present

## 2022-02-12 ENCOUNTER — Emergency Department (HOSPITAL_COMMUNITY): Payer: PPO

## 2022-02-12 ENCOUNTER — Other Ambulatory Visit: Payer: Self-pay

## 2022-02-12 ENCOUNTER — Inpatient Hospital Stay (HOSPITAL_COMMUNITY)
Admission: EM | Admit: 2022-02-12 | Discharge: 2022-02-15 | DRG: 481 | Disposition: A | Discharge status: Skilled Nursing Facility | Payer: PPO | Attending: Internal Medicine | Admitting: Internal Medicine

## 2022-02-12 DIAGNOSIS — Z7401 Bed confinement status: Secondary | ICD-10-CM | POA: Diagnosis not present

## 2022-02-12 DIAGNOSIS — F32A Depression, unspecified: Secondary | ICD-10-CM | POA: Diagnosis not present

## 2022-02-12 DIAGNOSIS — N183 Chronic kidney disease, stage 3 unspecified: Secondary | ICD-10-CM | POA: Diagnosis not present

## 2022-02-12 DIAGNOSIS — Z96662 Presence of left artificial ankle joint: Secondary | ICD-10-CM | POA: Diagnosis not present

## 2022-02-12 DIAGNOSIS — S72402A Unspecified fracture of lower end of left femur, initial encounter for closed fracture: Secondary | ICD-10-CM | POA: Diagnosis not present

## 2022-02-12 DIAGNOSIS — Z8249 Family history of ischemic heart disease and other diseases of the circulatory system: Secondary | ICD-10-CM

## 2022-02-12 DIAGNOSIS — Z79899 Other long term (current) drug therapy: Secondary | ICD-10-CM | POA: Diagnosis not present

## 2022-02-12 DIAGNOSIS — Z9049 Acquired absence of other specified parts of digestive tract: Secondary | ICD-10-CM

## 2022-02-12 DIAGNOSIS — Z853 Personal history of malignant neoplasm of breast: Secondary | ICD-10-CM

## 2022-02-12 DIAGNOSIS — G2581 Restless legs syndrome: Secondary | ICD-10-CM | POA: Diagnosis not present

## 2022-02-12 DIAGNOSIS — E113593 Type 2 diabetes mellitus with proliferative diabetic retinopathy without macular edema, bilateral: Secondary | ICD-10-CM | POA: Diagnosis present

## 2022-02-12 DIAGNOSIS — Y92002 Bathroom of unspecified non-institutional (private) residence single-family (private) house as the place of occurrence of the external cause: Secondary | ICD-10-CM | POA: Diagnosis not present

## 2022-02-12 DIAGNOSIS — K5900 Constipation, unspecified: Secondary | ICD-10-CM | POA: Diagnosis not present

## 2022-02-12 DIAGNOSIS — E1165 Type 2 diabetes mellitus with hyperglycemia: Secondary | ICD-10-CM | POA: Diagnosis not present

## 2022-02-12 DIAGNOSIS — M810 Age-related osteoporosis without current pathological fracture: Secondary | ICD-10-CM | POA: Diagnosis not present

## 2022-02-12 DIAGNOSIS — I480 Paroxysmal atrial fibrillation: Secondary | ICD-10-CM | POA: Diagnosis not present

## 2022-02-12 DIAGNOSIS — S72402D Unspecified fracture of lower end of left femur, subsequent encounter for closed fracture with routine healing: Secondary | ICD-10-CM | POA: Diagnosis not present

## 2022-02-12 DIAGNOSIS — Z96641 Presence of right artificial hip joint: Secondary | ICD-10-CM | POA: Diagnosis not present

## 2022-02-12 DIAGNOSIS — Z6830 Body mass index (BMI) 30.0-30.9, adult: Secondary | ICD-10-CM | POA: Diagnosis not present

## 2022-02-12 DIAGNOSIS — R531 Weakness: Secondary | ICD-10-CM | POA: Diagnosis not present

## 2022-02-12 DIAGNOSIS — S72462A Displaced supracondylar fracture with intracondylar extension of lower end of left femur, initial encounter for closed fracture: Secondary | ICD-10-CM | POA: Diagnosis not present

## 2022-02-12 DIAGNOSIS — D62 Acute posthemorrhagic anemia: Secondary | ICD-10-CM | POA: Diagnosis not present

## 2022-02-12 DIAGNOSIS — Z96653 Presence of artificial knee joint, bilateral: Secondary | ICD-10-CM | POA: Diagnosis not present

## 2022-02-12 DIAGNOSIS — S72452A Displaced supracondylar fracture without intracondylar extension of lower end of left femur, initial encounter for closed fracture: Principal | ICD-10-CM | POA: Diagnosis present

## 2022-02-12 DIAGNOSIS — N189 Chronic kidney disease, unspecified: Secondary | ICD-10-CM | POA: Diagnosis not present

## 2022-02-12 DIAGNOSIS — M9702XA Periprosthetic fracture around internal prosthetic left hip joint, initial encounter: Secondary | ICD-10-CM | POA: Diagnosis not present

## 2022-02-12 DIAGNOSIS — D0510 Intraductal carcinoma in situ of unspecified breast: Secondary | ICD-10-CM | POA: Diagnosis not present

## 2022-02-12 DIAGNOSIS — E0842 Diabetes mellitus due to underlying condition with diabetic polyneuropathy: Secondary | ICD-10-CM | POA: Diagnosis not present

## 2022-02-12 DIAGNOSIS — F418 Other specified anxiety disorders: Secondary | ICD-10-CM | POA: Diagnosis not present

## 2022-02-12 DIAGNOSIS — T148XXA Other injury of unspecified body region, initial encounter: Secondary | ICD-10-CM

## 2022-02-12 DIAGNOSIS — Z96642 Presence of left artificial hip joint: Secondary | ICD-10-CM | POA: Diagnosis present

## 2022-02-12 DIAGNOSIS — E1151 Type 2 diabetes mellitus with diabetic peripheral angiopathy without gangrene: Secondary | ICD-10-CM | POA: Diagnosis not present

## 2022-02-12 DIAGNOSIS — G4733 Obstructive sleep apnea (adult) (pediatric): Secondary | ICD-10-CM | POA: Diagnosis present

## 2022-02-12 DIAGNOSIS — E1142 Type 2 diabetes mellitus with diabetic polyneuropathy: Secondary | ICD-10-CM

## 2022-02-12 DIAGNOSIS — Z794 Long term (current) use of insulin: Secondary | ICD-10-CM

## 2022-02-12 DIAGNOSIS — I4891 Unspecified atrial fibrillation: Secondary | ICD-10-CM | POA: Diagnosis not present

## 2022-02-12 DIAGNOSIS — Z882 Allergy status to sulfonamides status: Secondary | ICD-10-CM | POA: Diagnosis not present

## 2022-02-12 DIAGNOSIS — D649 Anemia, unspecified: Secondary | ICD-10-CM | POA: Diagnosis not present

## 2022-02-12 DIAGNOSIS — Z7901 Long term (current) use of anticoagulants: Secondary | ICD-10-CM

## 2022-02-12 DIAGNOSIS — S72041S Displaced fracture of base of neck of right femur, sequela: Secondary | ICD-10-CM | POA: Diagnosis not present

## 2022-02-12 DIAGNOSIS — H04123 Dry eye syndrome of bilateral lacrimal glands: Secondary | ICD-10-CM | POA: Diagnosis not present

## 2022-02-12 DIAGNOSIS — I10 Essential (primary) hypertension: Secondary | ICD-10-CM | POA: Diagnosis not present

## 2022-02-12 DIAGNOSIS — I4892 Unspecified atrial flutter: Secondary | ICD-10-CM | POA: Diagnosis not present

## 2022-02-12 DIAGNOSIS — Z20822 Contact with and (suspected) exposure to covid-19: Secondary | ICD-10-CM | POA: Diagnosis not present

## 2022-02-12 DIAGNOSIS — Z9989 Dependence on other enabling machines and devices: Secondary | ICD-10-CM | POA: Diagnosis not present

## 2022-02-12 DIAGNOSIS — G4709 Other insomnia: Secondary | ICD-10-CM | POA: Diagnosis not present

## 2022-02-12 DIAGNOSIS — Z833 Family history of diabetes mellitus: Secondary | ICD-10-CM | POA: Diagnosis not present

## 2022-02-12 DIAGNOSIS — M25562 Pain in left knee: Secondary | ICD-10-CM | POA: Diagnosis not present

## 2022-02-12 DIAGNOSIS — W19XXXA Unspecified fall, initial encounter: Secondary | ICD-10-CM

## 2022-02-12 DIAGNOSIS — M1990 Unspecified osteoarthritis, unspecified site: Secondary | ICD-10-CM | POA: Diagnosis not present

## 2022-02-12 DIAGNOSIS — E785 Hyperlipidemia, unspecified: Secondary | ICD-10-CM | POA: Diagnosis not present

## 2022-02-12 DIAGNOSIS — N1832 Chronic kidney disease, stage 3b: Secondary | ICD-10-CM | POA: Diagnosis present

## 2022-02-12 DIAGNOSIS — S728X2A Other fracture of left femur, initial encounter for closed fracture: Secondary | ICD-10-CM

## 2022-02-12 DIAGNOSIS — Z90710 Acquired absence of both cervix and uterus: Secondary | ICD-10-CM

## 2022-02-12 DIAGNOSIS — E669 Obesity, unspecified: Secondary | ICD-10-CM | POA: Diagnosis present

## 2022-02-12 DIAGNOSIS — E1122 Type 2 diabetes mellitus with diabetic chronic kidney disease: Secondary | ICD-10-CM | POA: Diagnosis not present

## 2022-02-12 DIAGNOSIS — F339 Major depressive disorder, recurrent, unspecified: Secondary | ICD-10-CM | POA: Diagnosis not present

## 2022-02-12 DIAGNOSIS — E119 Type 2 diabetes mellitus without complications: Secondary | ICD-10-CM | POA: Diagnosis not present

## 2022-02-12 DIAGNOSIS — M25561 Pain in right knee: Secondary | ICD-10-CM | POA: Diagnosis not present

## 2022-02-12 DIAGNOSIS — Z419 Encounter for procedure for purposes other than remedying health state, unspecified: Secondary | ICD-10-CM

## 2022-02-12 DIAGNOSIS — K219 Gastro-esophageal reflux disease without esophagitis: Secondary | ICD-10-CM | POA: Diagnosis present

## 2022-02-12 DIAGNOSIS — I4811 Longstanding persistent atrial fibrillation: Secondary | ICD-10-CM | POA: Diagnosis not present

## 2022-02-12 DIAGNOSIS — W010XXA Fall on same level from slipping, tripping and stumbling without subsequent striking against object, initial encounter: Secondary | ICD-10-CM | POA: Diagnosis present

## 2022-02-12 DIAGNOSIS — Z01818 Encounter for other preprocedural examination: Secondary | ICD-10-CM | POA: Diagnosis not present

## 2022-02-12 DIAGNOSIS — Z86000 Personal history of in-situ neoplasm of breast: Secondary | ICD-10-CM | POA: Diagnosis present

## 2022-02-12 DIAGNOSIS — Z043 Encounter for examination and observation following other accident: Secondary | ICD-10-CM | POA: Diagnosis not present

## 2022-02-12 LAB — COMPREHENSIVE METABOLIC PANEL
ALT: 33 U/L (ref 0–44)
AST: 46 U/L — ABNORMAL HIGH (ref 15–41)
Albumin: 3.5 g/dL (ref 3.5–5.0)
Alkaline Phosphatase: 44 U/L (ref 38–126)
Anion gap: 14 (ref 5–15)
BUN: 19 mg/dL (ref 8–23)
CO2: 19 mmol/L — ABNORMAL LOW (ref 22–32)
Calcium: 9 mg/dL (ref 8.9–10.3)
Chloride: 103 mmol/L (ref 98–111)
Creatinine, Ser: 1.61 mg/dL — ABNORMAL HIGH (ref 0.44–1.00)
GFR, Estimated: 33 mL/min — ABNORMAL LOW (ref >60)
Glucose, Bld: 146 mg/dL — ABNORMAL HIGH (ref 70–99)
Potassium: 4.4 mmol/L (ref 3.5–5.1)
Sodium: 136 mmol/L (ref 135–145)
Total Bilirubin: 1.2 mg/dL (ref 0.3–1.2)
Total Protein: 6.4 g/dL — ABNORMAL LOW (ref 6.5–8.1)

## 2022-02-12 LAB — CBC WITH DIFFERENTIAL/PLATELET
Abs Immature Granulocytes: 0.02 10*3/uL (ref 0.00–0.07)
Basophils Absolute: 0 10*3/uL (ref 0.0–0.1)
Basophils Relative: 0 %
Eosinophils Absolute: 0.1 10*3/uL (ref 0.0–0.5)
Eosinophils Relative: 1 %
HCT: 45.9 % (ref 36.0–46.0)
Hemoglobin: 14.8 g/dL (ref 12.0–15.0)
Immature Granulocytes: 0 %
Lymphocytes Relative: 27 %
Lymphs Abs: 2.1 10*3/uL (ref 0.7–4.0)
MCH: 30.9 pg (ref 26.0–34.0)
MCHC: 32.2 g/dL (ref 30.0–36.0)
MCV: 95.8 fL (ref 80.0–100.0)
Monocytes Absolute: 0.7 10*3/uL (ref 0.1–1.0)
Monocytes Relative: 10 %
Neutro Abs: 4.8 10*3/uL (ref 1.7–7.7)
Neutrophils Relative %: 62 %
Platelets: 164 10*3/uL (ref 150–400)
RBC: 4.79 MIL/uL (ref 3.87–5.11)
RDW: 12.7 % (ref 11.5–15.5)
WBC: 7.7 10*3/uL (ref 4.0–10.5)
nRBC: 0 % (ref 0.0–0.2)

## 2022-02-12 LAB — RESP PANEL BY RT-PCR (FLU A&B, COVID) ARPGX2
Influenza A by PCR: NEGATIVE
Influenza B by PCR: NEGATIVE
SARS Coronavirus 2 by RT PCR: NEGATIVE

## 2022-02-12 LAB — PROTIME-INR
INR: 1.1 (ref 0.8–1.2)
Prothrombin Time: 13.9 seconds (ref 11.4–15.2)

## 2022-02-12 LAB — GLUCOSE, CAPILLARY: Glucose-Capillary: 157 mg/dL — ABNORMAL HIGH (ref 70–99)

## 2022-02-12 MED ORDER — DULOXETINE HCL 60 MG PO CPEP
60.0000 mg | ORAL_CAPSULE | Freq: Every morning | ORAL | Status: DC
  Administered 2022-02-14 – 2022-02-15 (×2): 60 mg via ORAL
  Filled 2022-02-12 (×2): qty 1

## 2022-02-12 MED ORDER — INSULIN GLARGINE-YFGN 100 UNIT/ML ~~LOC~~ SOLN
20.0000 [IU] | Freq: Every day | SUBCUTANEOUS | Status: DC
  Administered 2022-02-12 – 2022-02-13 (×2): 20 [IU] via SUBCUTANEOUS
  Filled 2022-02-12 (×3): qty 0.2

## 2022-02-12 MED ORDER — SENNOSIDES-DOCUSATE SODIUM 8.6-50 MG PO TABS: 1.0000 | ORAL_TABLET | Freq: Every evening | ORAL | Status: DC | PRN

## 2022-02-12 MED ORDER — ROPINIROLE HCL ER 6 MG PO TB24: 6.0000 mg | ORAL_TABLET | Freq: Every morning | ORAL | Status: DC

## 2022-02-12 MED ORDER — SODIUM CHLORIDE 0.9% FLUSH: 3.0000 mL | INTRAVENOUS | Status: DC | PRN

## 2022-02-12 MED ORDER — METHOCARBAMOL 1000 MG/10ML IJ SOLN
500.0000 mg | Freq: Four times a day (QID) | INTRAVENOUS | Status: DC | PRN
  Filled 2022-02-12 (×2): qty 5

## 2022-02-12 MED ORDER — SODIUM CHLORIDE 0.9% FLUSH
3.0000 mL | Freq: Two times a day (BID) | INTRAVENOUS | Status: DC
  Administered 2022-02-12 – 2022-02-15 (×2): 3 mL via INTRAVENOUS

## 2022-02-12 MED ORDER — HYDROCODONE-ACETAMINOPHEN 5-325 MG PO TABS
1.0000 | ORAL_TABLET | ORAL | Status: DC | PRN
  Administered 2022-02-13: 1 via ORAL
  Filled 2022-02-12: qty 1
  Filled 2022-02-12: qty 2

## 2022-02-12 MED ORDER — INSULIN ASPART 100 UNIT/ML IJ SOLN
0.0000 [IU] | Freq: Three times a day (TID) | INTRAMUSCULAR | Status: DC
  Administered 2022-02-13: 3 [IU] via SUBCUTANEOUS
  Administered 2022-02-13: 2 [IU] via SUBCUTANEOUS
  Administered 2022-02-14: 7 [IU] via SUBCUTANEOUS

## 2022-02-12 MED ORDER — MORPHINE SULFATE (PF) 4 MG/ML IV SOLN
4.0000 mg | Freq: Once | INTRAVENOUS | Status: AC
  Administered 2022-02-12: 4 mg via INTRAMUSCULAR
  Filled 2022-02-12: qty 1

## 2022-02-12 MED ORDER — HYDROMORPHONE HCL 1 MG/ML IJ SOLN
1.0000 mg | Freq: Once | INTRAMUSCULAR | Status: AC
  Administered 2022-02-12: 1 mg via INTRAVENOUS
  Filled 2022-02-12: qty 1

## 2022-02-12 MED ORDER — INSULIN GLARGINE-YFGN 100 UNIT/ML ~~LOC~~ SOLN
40.0000 [IU] | Freq: Every day | SUBCUTANEOUS | Status: DC
  Filled 2022-02-12: qty 0.4

## 2022-02-12 MED ORDER — INSULIN GLARGINE-LIXISENATIDE 100-33 UNT-MCG/ML ~~LOC~~ SOPN: 40.0000 [IU] | PEN_INJECTOR | Freq: Every day | SUBCUTANEOUS | Status: DC

## 2022-02-12 MED ORDER — ZOLPIDEM TARTRATE 5 MG PO TABS
5.0000 mg | ORAL_TABLET | Freq: Every evening | ORAL | Status: DC | PRN
  Administered 2022-02-12 – 2022-02-14 (×3): 5 mg via ORAL
  Filled 2022-02-12 (×3): qty 1

## 2022-02-12 MED ORDER — MORPHINE SULFATE (PF) 2 MG/ML IV SOLN
2.0000 mg | INTRAVENOUS | Status: DC | PRN
  Administered 2022-02-12 – 2022-02-13 (×4): 2 mg via INTRAVENOUS
  Filled 2022-02-12 (×4): qty 1

## 2022-02-12 MED ORDER — ROSUVASTATIN CALCIUM 20 MG PO TABS
40.0000 mg | ORAL_TABLET | Freq: Every day | ORAL | Status: DC
Start: 2022-02-12 — End: 2022-02-16
  Administered 2022-02-12 – 2022-02-14 (×3): 40 mg via ORAL
  Filled 2022-02-12 (×3): qty 2

## 2022-02-12 MED ORDER — METOPROLOL SUCCINATE ER 25 MG PO TB24
25.0000 mg | ORAL_TABLET | Freq: Two times a day (BID) | ORAL | Status: DC
Start: 2022-02-12 — End: 2022-02-16
  Administered 2022-02-12 – 2022-02-15 (×6): 25 mg via ORAL
  Filled 2022-02-12 (×6): qty 1

## 2022-02-12 MED ORDER — VITAMIN D 25 MCG (1000 UNIT) PO TABS: 2000.0000 [IU] | ORAL_TABLET | Freq: Every morning | ORAL | Status: DC

## 2022-02-12 MED ORDER — ROPINIROLE HCL ER 4 MG PO TB24
4.0000 mg | ORAL_TABLET | Freq: Every day | ORAL | Status: DC
  Administered 2022-02-12 – 2022-02-14 (×3): 4 mg via ORAL
  Filled 2022-02-12 (×4): qty 1

## 2022-02-12 MED ORDER — SODIUM CHLORIDE 0.9 % IV SOLN: 250.0000 mL | INTRAVENOUS | Status: DC | PRN

## 2022-02-12 MED ORDER — POLYETHYL GLYCOL-PROPYL GLYCOL 0.4-0.3 % OP SOLN: 1.0000 [drp] | Freq: Every day | OPHTHALMIC | Status: DC | PRN

## 2022-02-12 MED ORDER — POLYVINYL ALCOHOL 1.4 % OP SOLN
1.0000 [drp] | OPHTHALMIC | Status: DC | PRN
  Filled 2022-02-12: qty 15

## 2022-02-12 NOTE — Assessment & Plan Note (Addendum)
Rate controlled.  On anticoagulation with Eliquis ?Continue toprol-xl  ? ?

## 2022-02-12 NOTE — ED Triage Notes (Signed)
Pt BIB [facility] from home after falling in the bathroom at home. Patient reports knees when "out". Patient has bilateral hip and knee replacements. Patient did not report any pops or movement. EMS reports no obvious deformity, LOC, or head injury. Patient A&Ox4 at this time. ?

## 2022-02-12 NOTE — Progress Notes (Signed)
Orthopedic Tech Progress Note ?Patient Details:  ?Heather [name] ?07-31-1946 ?[id number] ? ?Ortho Devices ?Type of Ortho Device: Knee Immobilizer ?Ortho Device/Splint Location: LLE ?Ortho Device/Splint Interventions: Ordered, Application, Adjustment ?  ?Post Interventions ?Patient Tolerated: Well ?Instructions Provided: Care of device ? ?Janit Pagan ?02/12/2022, 3:24 PM ? ?

## 2022-02-12 NOTE — Assessment & Plan Note (Addendum)
a1c 6.9 in 04/2021 ?Takes insulin at home.  Hold farxiga. ?Continue current insulin regimen ?

## 2022-02-12 NOTE — Assessment & Plan Note (Signed)
Continue cymbalta  

## 2022-02-12 NOTE — H&P (Addendum)
History and Physical    Patient: Heather [name] QBH:[id number] DOB: 1945-12-28 DOA: 02/12/2022 DOS: the patient was seen and examined on 02/12/2022 PCP: Greig Right, MD  Patient coming from: Home - lives with her husband    Chief Complaint: fall with hip pain   HPI: Heather [name] is a 76 y.o. female with medical history significant of atrial fibrillation, hx of breast cancer, CKD stage 3, T2DM, GERD, OSA, RLS, depression who presented to ED after she slipped in her bathroom and fell to the floor. She landed on her inside of her knees an had immediate pain in both legs and could not get up so she was brought to the ED. She is on eliquis for afib and last took this AM.   She denies any recent fever/chills, headache/vision changes, chest pain or palpitations, shortness of breath or cough, stomach pain, N/V/D, dysuria or leg swelling.   She ambulates independently    ER Course:  vitals: afebrile, bp: 168/102, HR: 86, RR: 18, oxygen: 99%RA Pertinent labs: pending  Left knee xray: severely displaced left distal femoral periprosthetic fracture  Right knee xray: no acute finding  CXR: no active dx CT left knee: fx predominantly obliquely oriented extending form the lateral femoral condyle to the posteromedial aspect of the distal femoral condyle to the posteromedial aspect of the distal femoral diaphysis app. 10cm above the joint line. Fx slightly impacted.  In ED ortho consulted. Given morphine.    Review of Systems: As mentioned in the history of present illness. All other systems reviewed and are negative. Past Medical History:  Diagnosis Date   Atrial fibrillation ([place]) 08/14/2014   Bell's palsy    Bimalleolar fracture of left ankle    Breast cancer ([place])    Diabetes mellitus, type II ([place])    Dysrhythmia    a-fib   GERD (gastroesophageal reflux disease)    Low back pain with sciatica    Osteoarthritis    Proliferative diabetic retinopathy of both eyes ([place])    Restless legs 2012    Sleep apnea    CPAP nightly   Past Surgical History:  Procedure Laterality Date   both knees  2006   both legs Bilateral 2006 and 2007   cataractectomies     JOINT REPLACEMENT     bil TKR and lt hip   laser eye surgeries     left hip replacement      left knee replacement     ORIF ANKLE FRACTURE Left 01/19/2016   Procedure: OPEN REDUCTION INTERNAL FIXATION (ORIF) LEFT ANKLE BIMALLEOLAR  FRACTURE;  Surgeon: Wylene Simmer, MD;  Location: [place];  Service: Orthopedics;  Laterality: Left;   pin removal left foot     r breast due to cancer  2011   repair of left foot fracture     right foot surgery      right knee replacement      TOTAL HIP ARTHROPLASTY  2009   TOTAL HIP ARTHROPLASTY Right 05/09/2021   Procedure: TOTAL HIP ARTHROPLASTY ANTERIOR APPROACH;  Surgeon: Paralee Cancel, MD;  Location: [place];  Service: Orthopedics;  Laterality: Right;   VAGINAL HYSTERECTOMY     Social History:  reports that she has never smoked. She has never used smokeless tobacco. She reports that she does not drink alcohol and does not use drugs.  Allergies  Allergen Reactions   Sulfa Antibiotics Itching   Sulfasalazine Itching    Family History  Problem Relation Age of Onset  Diabetes Father    Heart disease Father    Diabetes Paternal Aunt        3 aunts had DM   Thyroid disease Maternal Grandmother    Diabetes Maternal Grandfather     Prior to Admission medications   Medication Sig Start Date End Date Taking? Authorizing Provider  apixaban (ELIQUIS) 5 MG TABS tablet Take 5 mg by mouth 2 (two) times daily.    [provider]  calcium carbonate (OS-CAL - DOSED IN MG OF ELEMENTAL CALCIUM) 1250 (500 Ca) MG tablet Take 1 tablet (500 mg of elemental calcium total) by mouth daily with breakfast. Patient not taking: No sig reported 05/12/21   Autry-Lott, Naaman Plummer, DO  Cholecalciferol 2000 UNITS TABS Take 2,000 Units by mouth every morning.    [provider]   dapagliflozin propanediol (FARXIGA) 10 MG TABS tablet Take 10 mg by mouth every morning.    [provider]  DULoxetine (CYMBALTA) 60 MG capsule Take 60 mg by mouth every morning. 05/02/21   [provider]  HYDROcodone-acetaminophen (NORCO/VICODIN) 5-325 MG tablet Take 1 tablet by mouth every 6 (six) hours as needed. Patient taking differently: Take 1 tablet by mouth every 6 (six) hours as needed for moderate pain. 05/11/21   Paralee Cancel, MD  Insulin Glargine-Lixisenatide (SOLIQUA) 100-33 UNT-MCG/ML SOPN Inject 50 Units into the skin at bedtime.    [provider]  metoprolol succinate (TOPROL-XL) 50 MG 24 hr tablet Take 25 mg by mouth every morning. 04/25/21   [provider]  Multiple Vitamin (MULTIVITAMIN WITH MINERALS) TABS tablet Take 1 tablet by mouth every morning.    [provider]  Olopatadine HCl (PATADAY OP) Place 1 drop into both eyes daily as needed (allergies/itching).    [provider]  Polyethyl Glycol-Propyl Glycol (SYSTANE) 0.4-0.3 % SOLN Place 1 drop into both eyes daily as needed (dry eyes).    [provider]  polyethylene glycol (MIRALAX / GLYCOLAX) 17 g packet Take 17 g by mouth 2 (two) times daily. 05/11/21   Irving Copas, PA-C  Ropinirole HCl 6 MG TB24 Take 6 mg by mouth every morning. 04/28/21   [provider]  rosuvastatin (CRESTOR) 40 MG tablet Take 40 mg by mouth at bedtime.    [provider]  tiZANidine (ZANAFLEX) 4 MG tablet Take 4 mg by mouth every 6 (six) hours as needed for muscle spasms.    [provider]  traMADol (ULTRAM) 50 MG tablet Take 50 mg by mouth every 6 (six) hours as needed for moderate pain.    [provider]  zolpidem (AMBIEN) 10 MG tablet Take 0.5 tablets (5 mg total) by mouth at bedtime. 05/12/21   Gerlene Fee, DO    Physical Exam: Vitals:   02/12/22 1159 02/12/22 1202 02/12/22 1204  BP:   (!) 168/102  Pulse:   86  Resp:   18  Temp:    98.7 F (37.1 C)  TempSrc:   Oral  SpO2: 100%  99%  Weight:  88.9 kg   Height:  '5\' 7"'$  (1.702 m)    General:  Appears calm and comfortable and is in NAD Eyes:  PERRL, EOMI, normal lids, iris ENT:  grossly normal hearing, lips & tongue, mmm; appropriate dentition Neck:  no LAD, masses or thyromegaly; no carotid bruits Cardiovascular:  irregularly, irregular, no m/r/g. No LE edema.  Respiratory:   CTA bilaterally with no wheezes/rales/rhonchi.  Normal respiratory effort. Abdomen:  soft, NT, ND, NABS Back:  normal alignment, no CVAT Skin:  no rash or induration seen on limited exam Musculoskeletal:  left knee: no erythema/swelling. No warmth or open wounds. She can move toes, palpables pedis pulses. TTP over patella/knee. grossly normal tone BUE/RLE Lower extremity:  No LE edema.  Limited foot exam with no ulcerations.  2+ distal pulses. Psychiatric:  grossly normal mood and affect, speech fluent and appropriate, AOx3 Neurologic:  CN 2-12 grossly intact, moves all extremities in coordinated fashion, sensation intact   Radiological Exams on Admission: Independently reviewed - see discussion in A/P where applicable  DG Chest 2 View  Result Date: 02/12/2022 CLINICAL DATA:  Fall. EXAM: CHEST - 2 VIEW COMPARISON:  May 07, 2021. FINDINGS: Stable cardiomediastinal silhouette. Both lungs are clear. The visualized skeletal structures are unremarkable. IMPRESSION: No active cardiopulmonary disease. Aortic Atherosclerosis (ICD10-I70.0). Electronically Signed   By: Marijo Conception M.D.   On: 02/12/2022 13:27   CT KNEE LEFT WO CONTRAST  Result Date: 02/12/2022 CLINICAL DATA:  Periprosthetic left knee fracture. Preoperative planning EXAM: CT OF THE LEFT KNEE WITHOUT CONTRAST TECHNIQUE: Multidetector CT imaging of the left knee was performed according to the standard protocol. Multiplanar CT image reconstructions were also generated. RADIATION DOSE REDUCTION: This exam was performed according to the  departmental dose-optimization program which includes automated exposure control, adjustment of the mA and/or kV according to patient size and/or use of iterative reconstruction technique. COMPARISON:  X-ray 02/12/2022 FINDINGS: Bones/Joint/Cartilage Postsurgical changes from left total knee arthroplasty. Redemonstration of acute periprosthetic fracture of the distal left femur. Fracture is predominantly obliquely oriented extending from the lateral femoral condyle to the posteromedial aspect of the distal femoral diaphysis approximately 10 cm above the joint line. Fracture is slightly impacted. Approximately 1.3 cm of posterior displacement. The patella, proximal tibia, and proximal fibula are intact without evidence of fracture. Small knee joint effusion. Ligaments Suboptimally assessed by CT. Muscles and Tendons No definite acute musculotendinous abnormality. Extensor mechanism is largely obscured by artifact. Soft tissues Small posttraumatic hematoma at the fracture site. Atherosclerotic vascular calcifications are present. IMPRESSION: Acute periprosthetic fracture of the distal left femur, as described above. Electronically Signed   By: Davina Poke D.O.   On: 02/12/2022 14:41   DG Knee Complete 4 Views Left  Result Date: 02/12/2022 CLINICAL DATA:  Left knee pain. EXAM: LEFT KNEE - COMPLETE 4+ VIEW COMPARISON:  None. FINDINGS: Status post left total knee arthroplasty. Severely displaced periprosthetic fracture is seen involving the distal left femur. IMPRESSION: Severely displaced left distal femoral periprosthetic fracture. Electronically Signed   By: Marijo Conception M.D.   On: 02/12/2022 13:23   DG Knee Complete 4 Views Right  Result Date: 02/12/2022 CLINICAL DATA:  Right knee pain after fall. EXAM: RIGHT KNEE - COMPLETE 4+ VIEW COMPARISON:  None. FINDINGS: Status post right total knee arthroplasty. The femoral and tibial components are well situated. No fracture, dislocation or effusion is noted.  IMPRESSION: No acute abnormality seen. Electronically Signed   By: Marijo Conception M.D.   On: 02/12/2022 13:26    EKG: ekg not released, have asked for this to be released. Rate controlled afib on past documented ekgs.    Labs on Admission: I have personally reviewed the available labs and imaging studies at the time of the admission.  Pertinent labs:   Pending, waiting on IV team    Assessment and Plan: * Closed fracture of left distal femur ([place]) 76 year old female with fall and severely displaced left distal femoral  periprosthetic fracture -admit to telemetry -ortho consulted with plans for OR tomorrow with Dr. Marcelino Scot -NPO at midnight -holding eliquis -pain control with norco/morphine prn and robaxin prn  -knee immobilizer placed in ED and orders per ortho   CKD (chronic kidney disease) stage 3, GFR 30-59 ml/min (HCC) Creatinine 1.4-1.7 baseline 1.6 today, continue to monitor   Atrial fibrillation ([place]) Rate controlled. Continue telemetry  Last took her eliquis this AM, holding for surgery tomorrow Continue toprol-xl    Diabetes mellitus ([place]) a1c 6.9 in 04/2021 Continue insulin. On combination insulin with glp-1, will just continue her 40 units long acting (decreased to 20 units today since NPO at midnight)  Hold farxiga SSI and accuchecks   Restless leg Continue requip   Depression Continue cymbalta   Obstructive apnea Does not wear cpap as prescribed and declines one here    Advance Care Planning:   Code Status: Full Code   Consults: ortho  DVT Prophylaxis: TED/SCD  Family Communication: friend at bedside: April   Severity of Illness: The appropriate patient status for this patient is INPATIENT. Inpatient status is judged to be reasonable and necessary in order to provide the required intensity of service to ensure the patient's safety. The patient's presenting symptoms, physical exam findings, and initial radiographic and laboratory data in the context  of their chronic comorbidities is felt to place them at high risk for further clinical deterioration. Furthermore, it is not anticipated that the patient will be medically stable for discharge from the hospital within 2 midnights of admission.   * I certify that at the point of admission it is my clinical judgment that the patient will require inpatient hospital care spanning beyond 2 midnights from the point of admission due to high intensity of service, high risk for further deterioration and high frequency of surveillance required.*  Author: Orma Flaming, MD 02/12/2022 8:21 PM  For on call review [email or web address].

## 2022-02-12 NOTE — Consult Note (Addendum)
Reason for Consult:Left distal femur fx ?Referring Physician: Delphina [name] ?Time called: 5102 ?Time at bedside: 1351 ? ? ?Heather [name] is an 76 y.o. female.  ?HPI: Heather [name] was in her bathroom and slipped on the tile floor. She landed on her knees and had immediate pain in both and could not get up. She was brought to the ED where x-rays showed a periprosthetic distal femur fx and orthopedic surgery was consulted.  ? ?Past Medical History:  ?Diagnosis Date  ? Atrial fibrillation ([place]) 08/14/2014  ? Bell's palsy   ? Bimalleolar fracture of left ankle   ? Breast cancer ([place])   ? Diabetes mellitus, type II ([place])   ? Dysrhythmia   ? a-fib  ? GERD (gastroesophageal reflux disease)   ? Low back pain with sciatica   ? Osteoarthritis   ? Proliferative diabetic retinopathy of both eyes ([place])   ? Restless legs 2012  ? Sleep apnea   ? CPAP nightly  ? ? ?Past Surgical History:  ?Procedure Laterality Date  ? both knees  2006  ? both legs Bilateral 2006 and 2007  ? cataractectomies    ? JOINT REPLACEMENT    ? bil TKR and lt hip  ? laser eye surgeries    ? left hip replacement     ? left knee replacement    ? ORIF ANKLE FRACTURE Left 01/19/2016  ? Procedure: OPEN REDUCTION INTERNAL FIXATION (ORIF) LEFT ANKLE BIMALLEOLAR  FRACTURE;  Surgeon: Heather Simmer, MD;  Location: [facility];  Service: Orthopedics;  Laterality: Left;  ? pin removal left foot    ? r breast due to cancer  2011  ? repair of left foot fracture    ? right foot surgery     ? right knee replacement     ? TOTAL HIP ARTHROPLASTY  2009  ? TOTAL HIP ARTHROPLASTY Right 05/09/2021  ? Procedure: TOTAL HIP ARTHROPLASTY ANTERIOR APPROACH;  Surgeon: Heather Cancel, MD;  Location: [facility];  Service: Orthopedics;  Laterality: Right;  ? VAGINAL HYSTERECTOMY    ? ? ?Family History  ?Problem Relation Age of Onset  ? Diabetes Father   ? Heart disease Father   ? Diabetes Paternal Aunt   ?     3 aunts had DM  ? Thyroid disease Maternal Grandmother   ? Diabetes Maternal  Grandfather   ? ? ?Social History:  reports that she has never smoked. She has never used smokeless tobacco. She reports that she does not drink alcohol and does not use drugs. ? ?Allergies:  ?Allergies  ?Allergen Reactions  ? Sulfa Antibiotics Itching  ? Sulfasalazine Itching  ? ? ?Medications: I have reviewed the patient's current medications. ? ?No results found for this or any previous visit (from the past 48 hour(s)). ? ?DG Chest 2 View ? ?Result Date: 02/12/2022 ?CLINICAL DATA:  Fall. EXAM: CHEST - 2 VIEW COMPARISON:  May 07, 2021. FINDINGS: Stable cardiomediastinal silhouette. Both lungs are clear. The visualized skeletal structures are unremarkable. IMPRESSION: No active cardiopulmonary disease. Aortic Atherosclerosis (ICD10-I70.0). Electronically Signed   By: Heather [name] M.D.   On: 02/12/2022 13:27  ? ?DG Knee Complete 4 Views Left ? ?Result Date: 02/12/2022 ?CLINICAL DATA:  Left knee pain. EXAM: LEFT KNEE - COMPLETE 4+ VIEW COMPARISON:  None. FINDINGS: Status post left total knee arthroplasty. Severely displaced periprosthetic fracture is seen involving the distal left femur. IMPRESSION: Severely displaced left distal femoral periprosthetic fracture. Electronically Signed   By: Heather [name]  M.D.   On: 02/12/2022 13:23  ? ?DG Knee Complete 4 Views Right ? ?Result Date: 02/12/2022 ?CLINICAL DATA:  Right knee pain after fall. EXAM: RIGHT KNEE - COMPLETE 4+ VIEW COMPARISON:  None. FINDINGS: Status post right total knee arthroplasty. The femoral and tibial components are well situated. No fracture, dislocation or effusion is noted. IMPRESSION: No acute abnormality seen. Electronically Signed   By: Heather [name] M.D.   On: 02/12/2022 13:26   ? ?Review of Systems  ?HENT:  Negative for ear discharge, ear pain, hearing loss and tinnitus.   ?Eyes:  Negative for photophobia and pain.  ?Respiratory:  Negative for cough and shortness of breath.   ?Cardiovascular:  Negative for chest pain.  ?Gastrointestinal:   Negative for abdominal pain, nausea and vomiting.  ?Genitourinary:  Negative for dysuria, flank pain, frequency and urgency.  ?Musculoskeletal:  Positive for arthralgias (Left knee). Negative for back pain, myalgias and neck pain.  ?Neurological:  Negative for dizziness and headaches.  ?Hematological:  Does not bruise/bleed easily.  ?Psychiatric/Behavioral:  The patient is not nervous/anxious.   ?Blood pressure (!) 168/102, pulse 86, temperature 98.7 ?F (37.1 ?C), temperature source Oral, resp. rate 18, height '5\' 7"'$  (1.702 m), weight 88.9 kg, SpO2 99 %. ?Physical Exam ?Constitutional:   ?   General: She is not in acute distress. ?   Appearance: She is well-developed. She is not diaphoretic.  ?HENT:  ?   Head: Normocephalic and atraumatic.  ?Eyes:  ?   General: No scleral icterus.    ?   Right eye: No discharge.     ?   Left eye: No discharge.  ?   Conjunctiva/sclera: Conjunctivae normal.  ?Cardiovascular:  ?   Rate and Rhythm: Normal rate and regular rhythm.  ?Pulmonary:  ?   Effort: Pulmonary effort is normal. No respiratory distress.  ?Musculoskeletal:  ?   Cervical back: Normal range of motion.  ?   Comments: LLE No traumatic wounds, ecchymosis, or rash ? Severe TTP knee ? No knee or ankle effusion, compartments soft ? Sens DPN, SPN, TN intact ? Motor EHL, ext, flex, evers 5/5 ? DP 1+, PT 0, No significant edema  ?Skin: ?   General: Skin is warm and dry.  ?Neurological:  ?   Mental Status: She is alert.  ?Psychiatric:     ?   Mood and Affect: Mood normal.     ?   Behavior: Behavior normal.  ? ? ?Assessment/Plan: ?Left distal femur fx -- Plan ORIF tomorrow with Dr. Marcelino [name]. Please keep NPO after MN. CT and KI today. ?Poor balance, occasional use of cane ?Neuropathy ?DM ?Lumbar spine disease ?Cardiac arrhythmia ?Breast cancer history ? ? ? ?Heather Abu, PA-C ?Orthopedic Surgery ?[phone] ?02/12/2022, 2:00 PM  ? ? ?_______________________________________________________ ? ? ?Left distal femur fx -- Plan ORIF  today  ?Poor balance, occasional use of cane ?Neuropathy ?DM ?Lumbar spine disease ?Cardiac arrhythmia ?Breast cancer history ? ?As the orthopaedic trauma surgeon on call, I have seen and examined the patient at the direction of the primary service. I have personally reviewed and discussed in detail with Silvestre Gunner, PA-C, the patient's presentation, progress, and confirmed the examination findings; I have also reviewed and interpreted the x-rays and laboratory studies; and I formulated the plan for treatment which is outlined above, in addition to communicating with the primary service. ? ?I discussed with the patient the risks and benefits of surgery for left periprosthetic femur repair, including the possibility of infection,  nerve injury, vessel injury, wound breakdown, arthritis, symptomatic hardware, DVT/ PE, loss of motion, malunion, nonunion, and need for further surgery among others.  We also specifically discussed the possibilities of stopping our implant well short of the total hip stem or overlapping with it if in close proximity. She acknowledged these risks and wished to proceed. ? ? ?Altamese Hemphill, MD ?[facility], [place] ?[phone] ?

## 2022-02-12 NOTE — Assessment & Plan Note (Signed)
Does not wear cpap as prescribed and declines one here ?

## 2022-02-12 NOTE — Assessment & Plan Note (Signed)
Continue requip

## 2022-02-12 NOTE — ED Notes (Signed)
Patient moved to hospital bed, patient reports 9/10 pain.  ?

## 2022-02-12 NOTE — Assessment & Plan Note (Addendum)
Creatinine 1.4-1.7 baseline ?Stable ?

## 2022-02-12 NOTE — Assessment & Plan Note (Addendum)
76 year old female with fall and severely displaced left distal femoral periprosthetic fracture ?-Status post ORIF on 02/13/2022 ?-Continue pain management, supportive care, bowel regimen ?-On Eliquis for anticoagulation ?-PT/OT recommending skilled nursing facility on discharge ?

## 2022-02-12 NOTE — ED Provider Notes (Signed)
?[facility] ?Provider Note ? ? ?CSN: [id number] ?Arrival date & time: 02/12/22  1153 ? ?  ? ?History ? ?Chief Complaint  ?Patient presents with  ?? Knee Pain  ? ? ?Heather [name] is a 76 y.o. female. ? ?Patient reports she slipped and fell at home patient reports her right knee went behind her left knee went out sideways.  Patient complains of pain in both knees she has had bilateral knee replacements and bilateral hip replacements by Dr. Alvan Dame.  Patient reports she did not strike her head patient denies any neck pain or any back pain patient is not currently having any hip pain.  She reports she was unable to stand up after fall ? ?The history is provided by the patient. No language interpreter was used.  ?Knee Pain ?Location:  Knee ?Time since incident:  1 hour ?Pain details:  ?  Quality:  Aching ?  Radiates to:  Does not radiate ?  Severity:  Moderate ?  Timing:  Constant ?  Progression:  Worsening ?Chronicity:  New ?Prior injury to area:  Yes ?Relieved by:  Nothing ?Worsened by:  Flexion ?Ineffective treatments:  None tried ?Risk factors: no concern for non-accidental trauma   ? ?  ? ?Home Medications ?Prior to Admission medications   ?Medication Sig Start Date End Date Taking? Authorizing Provider  ?apixaban (ELIQUIS) 5 MG TABS tablet Take 5 mg by mouth 2 (two) times daily.    [provider]  ?calcium carbonate (OS-CAL - DOSED IN MG OF ELEMENTAL CALCIUM) 1250 (500 Ca) MG tablet Take 1 tablet (500 mg of elemental calcium total) by mouth daily with breakfast. ?Patient not taking: No sig reported 05/12/21   Gerlene Fee, DO  ?Cholecalciferol 2000 UNITS TABS Take 2,000 Units by mouth every morning.    [provider]  ?dapagliflozin propanediol (FARXIGA) 10 MG TABS tablet Take 10 mg by mouth every morning.    [provider]  ?DULoxetine (CYMBALTA) 60 MG capsule Take 60 mg by mouth every morning. 05/02/21   [provider]   ?HYDROcodone-acetaminophen (NORCO/VICODIN) 5-325 MG tablet Take 1 tablet by mouth every 6 (six) hours as needed. ?Patient taking differently: Take 1 tablet by mouth every 6 (six) hours as needed for moderate pain. 05/11/21   Paralee Cancel, MD  ?Insulin Glargine-Lixisenatide (SOLIQUA) 100-33 UNT-MCG/ML SOPN Inject 50 Units into the skin at bedtime.    [provider]  ?metoprolol succinate (TOPROL-XL) 50 MG 24 hr tablet Take 25 mg by mouth every morning. 04/25/21   [provider]  ?Multiple Vitamin (MULTIVITAMIN WITH MINERALS) TABS tablet Take 1 tablet by mouth every morning.    [provider]  ?Olopatadine HCl (PATADAY OP) Place 1 drop into both eyes daily as needed (allergies/itching).    [provider]  ?Polyethyl Glycol-Propyl Glycol (SYSTANE) 0.4-0.3 % SOLN Place 1 drop into both eyes daily as needed (dry eyes).    [provider]  ?polyethylene glycol (MIRALAX / GLYCOLAX) 17 g packet Take 17 g by mouth 2 (two) times daily. 05/11/21   Irving Copas, PA-C  ?Ropinirole HCl 6 MG TB24 Take 6 mg by mouth every morning. 04/28/21   [provider]  ?rosuvastatin (CRESTOR) 40 MG tablet Take 40 mg by mouth at bedtime.    [provider]  ?tiZANidine (ZANAFLEX) 4 MG tablet Take 4 mg by mouth every 6 (six) hours as needed for muscle spasms.    [provider]  ?traMADol Veatrice Bourbon) 50  MG tablet Take 50 mg by mouth every 6 (six) hours as needed for moderate pain.    [provider]  ?zolpidem (AMBIEN) 10 MG tablet Take 0.5 tablets (5 mg total) by mouth at bedtime. 05/12/21   Autry-Lott, Naaman Plummer, DO  ?   ? ?Allergies    ?Sulfa antibiotics and Sulfasalazine   ? ?Review of Systems   ?Review of Systems  ?Musculoskeletal:  Positive for joint swelling and myalgias.  ?All other systems reviewed and are negative. ? ?Physical Exam ?Updated Vital Signs ?BP (!) 168/102 (BP Location: Right Arm)   Pulse 86   Temp 98.7 ?F (37.1 ?C) (Oral)   Resp 18   Ht 5'  7" (1.702 m)   Wt 88.9 kg   SpO2 99%   BMI 30.70 kg/m?  ?Physical Exam ?Vitals and nursing note reviewed.  ?Constitutional:   ?   Appearance: She is well-developed.  ?HENT:  ?   Head: Normocephalic.  ?Cardiovascular:  ?   Rate and Rhythm: Normal rate.  ?Pulmonary:  ?   Effort: Pulmonary effort is normal.  ?Abdominal:  ?   General: Abdomen is flat. There is no distension.  ?Musculoskeletal:     ?   General: Swelling and tenderness present.  ?   Cervical back: Normal range of motion.  ?Neurological:  ?   General: No focal deficit present.  ?   Mental Status: She is alert and oriented to person, place, and time.  ?Psychiatric:     ?   Mood and Affect: Mood normal.  ? ? ?ED Results / Procedures / Treatments   ?Labs ?(all labs ordered are listed, but only abnormal results are displayed) ?Labs Reviewed  ?RESP PANEL BY RT-PCR (FLU A&B, COVID) [id number]  ?CBC WITH DIFFERENTIAL/PLATELET  ?COMPREHENSIVE METABOLIC PANEL  ? ? ?EKG ?None ? ?Radiology ?DG Chest 2 View ? ?Result Date: 02/12/2022 ?CLINICAL DATA:  Fall. EXAM: CHEST - 2 VIEW COMPARISON:  May 07, 2021. FINDINGS: Stable cardiomediastinal silhouette. Both lungs are clear. The visualized skeletal structures are unremarkable. IMPRESSION: No active cardiopulmonary disease. Aortic Atherosclerosis (ICD10-I70.0). Electronically Signed   By: Marijo Conception M.D.   On: 02/12/2022 13:27  ? ?DG Knee Complete 4 Views Left ? ?Result Date: 02/12/2022 ?CLINICAL DATA:  Left knee pain. EXAM: LEFT KNEE - COMPLETE 4+ VIEW COMPARISON:  None. FINDINGS: Status post left total knee arthroplasty. Severely displaced periprosthetic fracture is seen involving the distal left femur. IMPRESSION: Severely displaced left distal femoral periprosthetic fracture. Electronically Signed   By: Marijo Conception M.D.   On: 02/12/2022 13:23  ? ?DG Knee Complete 4 Views Right ? ?Result Date: 02/12/2022 ?CLINICAL DATA:  Right knee pain after fall. EXAM: RIGHT KNEE - COMPLETE 4+ VIEW COMPARISON:  None. FINDINGS:  Status post right total knee arthroplasty. The femoral and tibial components are well situated. No fracture, dislocation or effusion is noted. IMPRESSION: No acute abnormality seen. Electronically Signed   By: Marijo Conception M.D.   On: 02/12/2022 13:26   ? ?Procedures ?Procedures  ? ? ?Medications Ordered in ED ?Medications  ?morphine (PF) 4 MG/ML injection 4 mg (4 mg Intramuscular Given 02/12/22 1246)  ? ? ?ED Course/ Medical Decision Making/ A&P ?  ?                        ?Medical Decision Making ?Amount and/or Complexity of Data Reviewed ?Independent Historian: friend ?   Details: Pt has a friend with her.  She  reports pt feel a year ago ?External Data Reviewed: notes. ?   Details: Pt sees Dr. Burnett Sheng in [place].  office notes not available.   I reviewed Ortho notes form surgery by Dr. Alvan Dame ?Labs: ordered. Decision-making details documented in ED Course. ?Radiology: ordered and independent interpretation performed. Decision-making details documented in ED Course. ?   Details: xrays obtasined reviewd adn interpreted.  Xray left knee shows severly dispaced left distal femoral periprosthetic fracture. right knee no fracture  chest xray shows no acute abnormality ?Discussion of management or test interpretation with external provider(s): I spoke with [facility] on call.  He will see pt here for evaltuion  ? ?Risk ?Parenteral controlled substances. ?Decision regarding hospitalization. ? ? ? ? ? ?  ? ? ? ? ?Final Clinical Impression(s) / ED Diagnoses ?Final diagnoses:  ?None  ? ? ?Rx / DC Orders ?ED Discharge Orders   ? ? None  ? ?  ? ? ?  ?Fransico Meadow, [place] ?02/12/22 1520 ? ?  ?Davonna Belling, MD ?02/13/22 1229 ? ?

## 2022-02-13 ENCOUNTER — Inpatient Hospital Stay (HOSPITAL_COMMUNITY): Payer: PPO

## 2022-02-13 ENCOUNTER — Encounter (HOSPITAL_COMMUNITY)
Admission: EM | Disposition: A | Discharge status: Skilled Nursing Facility | Payer: Self-pay | Source: Home / Self Care | Attending: Internal Medicine

## 2022-02-13 ENCOUNTER — Inpatient Hospital Stay (HOSPITAL_COMMUNITY): Payer: PPO | Admitting: Anesthesiology

## 2022-02-13 ENCOUNTER — Other Ambulatory Visit: Payer: Self-pay

## 2022-02-13 DIAGNOSIS — I4891 Unspecified atrial fibrillation: Secondary | ICD-10-CM

## 2022-02-13 DIAGNOSIS — S72402A Unspecified fracture of lower end of left femur, initial encounter for closed fracture: Secondary | ICD-10-CM

## 2022-02-13 DIAGNOSIS — I4811 Longstanding persistent atrial fibrillation: Secondary | ICD-10-CM | POA: Diagnosis not present

## 2022-02-13 DIAGNOSIS — G4733 Obstructive sleep apnea (adult) (pediatric): Secondary | ICD-10-CM

## 2022-02-13 DIAGNOSIS — N1832 Chronic kidney disease, stage 3b: Secondary | ICD-10-CM | POA: Diagnosis not present

## 2022-02-13 DIAGNOSIS — E1151 Type 2 diabetes mellitus with diabetic peripheral angiopathy without gangrene: Secondary | ICD-10-CM

## 2022-02-13 DIAGNOSIS — G2581 Restless legs syndrome: Secondary | ICD-10-CM | POA: Diagnosis not present

## 2022-02-13 HISTORY — PX: ORIF FEMUR FRACTURE: SHX2119

## 2022-02-13 LAB — BASIC METABOLIC PANEL
Anion gap: 12 (ref 5–15)
BUN: 18 mg/dL (ref 8–23)
CO2: 23 mmol/L (ref 22–32)
Calcium: 9.4 mg/dL (ref 8.9–10.3)
Chloride: 105 mmol/L (ref 98–111)
Creatinine, Ser: 1.62 mg/dL — ABNORMAL HIGH (ref 0.44–1.00)
GFR, Estimated: 33 mL/min — ABNORMAL LOW (ref >60)
Glucose, Bld: 138 mg/dL — ABNORMAL HIGH (ref 70–99)
Potassium: 4.2 mmol/L (ref 3.5–5.1)
Sodium: 140 mmol/L (ref 135–145)

## 2022-02-13 LAB — SURGICAL PCR SCREEN
MRSA, PCR: NEGATIVE
Staphylococcus aureus: POSITIVE — AB

## 2022-02-13 LAB — CBC
HCT: 47.5 % — ABNORMAL HIGH (ref 36.0–46.0)
Hemoglobin: 15.6 g/dL — ABNORMAL HIGH (ref 12.0–15.0)
MCH: 31 pg (ref 26.0–34.0)
MCHC: 32.8 g/dL (ref 30.0–36.0)
MCV: 94.2 fL (ref 80.0–100.0)
Platelets: 173 10*3/uL (ref 150–400)
RBC: 5.04 MIL/uL (ref 3.87–5.11)
RDW: 12.7 % (ref 11.5–15.5)
WBC: 7.1 10*3/uL (ref 4.0–10.5)
nRBC: 0 % (ref 0.0–0.2)

## 2022-02-13 LAB — GLUCOSE, CAPILLARY
Glucose-Capillary: 160 mg/dL — ABNORMAL HIGH (ref 70–99)
Glucose-Capillary: 134 mg/dL — ABNORMAL HIGH (ref 70–99)
Glucose-Capillary: 148 mg/dL — ABNORMAL HIGH (ref 70–99)
Glucose-Capillary: 206 mg/dL — ABNORMAL HIGH (ref 70–99)
Glucose-Capillary: 297 mg/dL — ABNORMAL HIGH (ref 70–99)

## 2022-02-13 SURGERY — OPEN REDUCTION INTERNAL FIXATION (ORIF) DISTAL FEMUR FRACTURE
Anesthesia: General | Site: Leg Upper | Laterality: Left

## 2022-02-13 MED ORDER — CEFAZOLIN SODIUM-DEXTROSE 2-4 GM/100ML-% IV SOLN
2.0000 g | Freq: Once | INTRAVENOUS | Status: AC
  Administered 2022-02-13: 2 g via INTRAVENOUS
  Filled 2022-02-13: qty 100

## 2022-02-13 MED ORDER — ORAL CARE MOUTH RINSE: 15.0000 mL | Freq: Once | OROMUCOSAL | Status: AC

## 2022-02-13 MED ORDER — DEXAMETHASONE SODIUM PHOSPHATE 10 MG/ML IJ SOLN
INTRAMUSCULAR | Status: DC | PRN
  Administered 2022-02-13: 4 mg via INTRAVENOUS

## 2022-02-13 MED ORDER — ROCURONIUM BROMIDE 10 MG/ML (PF) SYRINGE
PREFILLED_SYRINGE | INTRAVENOUS | Status: DC | PRN
  Administered 2022-02-13: 70 mg via INTRAVENOUS

## 2022-02-13 MED ORDER — FENTANYL CITRATE (PF) 100 MCG/2ML IJ SOLN
25.0000 ug | INTRAMUSCULAR | Status: DC | PRN
  Administered 2022-02-13 (×2): 50 ug via INTRAVENOUS

## 2022-02-13 MED ORDER — VITAMIN D 25 MCG (1000 UNIT) PO TABS
2000.0000 [IU] | ORAL_TABLET | Freq: Two times a day (BID) | ORAL | Status: DC
  Administered 2022-02-13 – 2022-02-15 (×4): 2000 [IU] via ORAL
  Filled 2022-02-13 (×4): qty 2

## 2022-02-13 MED ORDER — ASCORBIC ACID 500 MG PO TABS
500.0000 mg | ORAL_TABLET | Freq: Every day | ORAL | Status: DC
  Administered 2022-02-13 – 2022-02-15 (×3): 500 mg via ORAL
  Filled 2022-02-13 (×4): qty 1

## 2022-02-13 MED ORDER — OXYCODONE HCL 5 MG PO TABS
5.0000 mg | ORAL_TABLET | Freq: Once | ORAL | Status: AC | PRN
  Administered 2022-02-13: 5 mg via ORAL

## 2022-02-13 MED ORDER — FENTANYL CITRATE (PF) 250 MCG/5ML IJ SOLN
INTRAMUSCULAR | Status: DC | PRN
Start: 2022-02-13 — End: 2022-02-13
  Administered 2022-02-13 (×2): 50 ug via INTRAVENOUS
  Administered 2022-02-13 (×2): 25 ug via INTRAVENOUS
  Administered 2022-02-13 (×2): 50 ug via INTRAVENOUS

## 2022-02-13 MED ORDER — PROPOFOL 10 MG/ML IV BOLUS
INTRAVENOUS | Status: DC | PRN
  Administered 2022-02-13: 120 mg via INTRAVENOUS

## 2022-02-13 MED ORDER — ONDANSETRON HCL 4 MG/2ML IJ SOLN
INTRAMUSCULAR | Status: DC | PRN
  Administered 2022-02-13: 4 mg via INTRAVENOUS

## 2022-02-13 MED ORDER — AMISULPRIDE (ANTIEMETIC) 5 MG/2ML IV SOLN: 10.0000 mg | Freq: Once | INTRAVENOUS | Status: DC | PRN

## 2022-02-13 MED ORDER — LACTATED RINGERS IV SOLN: INTRAVENOUS | Status: DC

## 2022-02-13 MED ORDER — PHENYLEPHRINE HCL (PRESSORS) 10 MG/ML IV SOLN
INTRAVENOUS | Status: DC | PRN
  Administered 2022-02-13: 80 ug via INTRAVENOUS
  Administered 2022-02-13: 120 ug via INTRAVENOUS

## 2022-02-13 MED ORDER — DIPHENHYDRAMINE HCL 25 MG PO CAPS
25.0000 mg | ORAL_CAPSULE | Freq: Three times a day (TID) | ORAL | Status: DC | PRN
  Administered 2022-02-13 – 2022-02-14 (×2): 25 mg via ORAL
  Filled 2022-02-13 (×2): qty 1

## 2022-02-13 MED ORDER — MORPHINE SULFATE (PF) 2 MG/ML IV SOLN
2.0000 mg | INTRAVENOUS | Status: DC | PRN
  Administered 2022-02-13: 2 mg via INTRAVENOUS
  Filled 2022-02-13: qty 1

## 2022-02-13 MED ORDER — FENTANYL CITRATE (PF) 250 MCG/5ML IJ SOLN
INTRAMUSCULAR | Status: AC
  Filled 2022-02-13: qty 5

## 2022-02-13 MED ORDER — ACETAMINOPHEN 325 MG PO TABS
650.0000 mg | ORAL_TABLET | Freq: Two times a day (BID) | ORAL | Status: DC
  Administered 2022-02-13 – 2022-02-15 (×4): 650 mg via ORAL
  Filled 2022-02-13 (×6): qty 2

## 2022-02-13 MED ORDER — OXYCODONE HCL 5 MG PO TABS
ORAL_TABLET | ORAL | Status: AC
  Filled 2022-02-13: qty 1

## 2022-02-13 MED ORDER — PHENYLEPHRINE HCL (PRESSORS) 10 MG/ML IV SOLN: INTRAVENOUS | Status: DC | PRN

## 2022-02-13 MED ORDER — CEFAZOLIN SODIUM-DEXTROSE 1-4 GM/50ML-% IV SOLN
1.0000 g | Freq: Four times a day (QID) | INTRAVENOUS | Status: AC
  Administered 2022-02-13 – 2022-02-14 (×3): 1 g via INTRAVENOUS
  Filled 2022-02-13 (×3): qty 50

## 2022-02-13 MED ORDER — DOCUSATE SODIUM 100 MG PO CAPS
100.0000 mg | ORAL_CAPSULE | Freq: Two times a day (BID) | ORAL | Status: DC
  Administered 2022-02-13 – 2022-02-15 (×4): 100 mg via ORAL
  Filled 2022-02-13 (×4): qty 1

## 2022-02-13 MED ORDER — ONDANSETRON HCL 4 MG/2ML IJ SOLN: 4.0000 mg | Freq: Once | INTRAMUSCULAR | Status: DC | PRN

## 2022-02-13 MED ORDER — FENTANYL CITRATE (PF) 100 MCG/2ML IJ SOLN
INTRAMUSCULAR | Status: AC
  Filled 2022-02-13: qty 2

## 2022-02-13 MED ORDER — FENTANYL CITRATE (PF) 250 MCG/5ML IJ SOLN
INTRAMUSCULAR | Status: DC | PRN
Start: 2022-02-13 — End: 2022-02-13

## 2022-02-13 MED ORDER — ONDANSETRON HCL 4 MG PO TABS: 4.0000 mg | ORAL_TABLET | Freq: Four times a day (QID) | ORAL | Status: DC | PRN

## 2022-02-13 MED ORDER — ACETAMINOPHEN 500 MG PO TABS: 1000.0000 mg | ORAL_TABLET | Freq: Once | ORAL | Status: AC

## 2022-02-13 MED ORDER — PROPOFOL 10 MG/ML IV BOLUS
INTRAVENOUS | Status: AC
Start: 2022-02-13 — End: ?
  Filled 2022-02-13: qty 20

## 2022-02-13 MED ORDER — ACETAMINOPHEN 500 MG PO TABS
ORAL_TABLET | ORAL | Status: AC
  Administered 2022-02-13: 1000 mg via ORAL
  Filled 2022-02-13: qty 2

## 2022-02-13 MED ORDER — HYDROCODONE-ACETAMINOPHEN 5-325 MG PO TABS
1.0000 | ORAL_TABLET | Freq: Four times a day (QID) | ORAL | Status: DC | PRN
  Administered 2022-02-13 – 2022-02-15 (×6): 2 via ORAL
  Filled 2022-02-13 (×7): qty 2

## 2022-02-13 MED ORDER — LIDOCAINE 2% (20 MG/ML) 5 ML SYRINGE
INTRAMUSCULAR | Status: DC | PRN
  Administered 2022-02-13: 60 mg via INTRAVENOUS

## 2022-02-13 MED ORDER — NALOXONE HCL 0.4 MG/ML IJ SOLN: 0.4000 mg | INTRAMUSCULAR | Status: DC | PRN

## 2022-02-13 MED ORDER — TRANEXAMIC ACID-NACL 1000-0.7 MG/100ML-% IV SOLN
1000.0000 mg | Freq: Once | INTRAVENOUS | Status: AC
  Administered 2022-02-13: 1000 mg via INTRAVENOUS
  Filled 2022-02-13: qty 100

## 2022-02-13 MED ORDER — 0.9 % SODIUM CHLORIDE (POUR BTL) OPTIME
TOPICAL | Status: DC | PRN
  Administered 2022-02-13: 1000 mL

## 2022-02-13 MED ORDER — CHLORHEXIDINE GLUCONATE 0.12 % MT SOLN
15.0000 mL | Freq: Once | OROMUCOSAL | Status: AC
  Administered 2022-02-13: 15 mL via OROMUCOSAL

## 2022-02-13 MED ORDER — MUPIROCIN 2 % EX OINT
1.0000 "application " | TOPICAL_OINTMENT | Freq: Two times a day (BID) | CUTANEOUS | Status: DC
  Administered 2022-02-13 – 2022-02-15 (×4): 1 via NASAL
  Filled 2022-02-13: qty 22

## 2022-02-13 MED ORDER — DEXAMETHASONE SODIUM PHOSPHATE 10 MG/ML IJ SOLN
INTRAMUSCULAR | Status: AC
  Filled 2022-02-13: qty 1

## 2022-02-13 MED ORDER — OXYCODONE HCL 5 MG/5ML PO SOLN: 5.0000 mg | Freq: Once | ORAL | Status: AC | PRN

## 2022-02-13 MED ORDER — SUGAMMADEX SODIUM 200 MG/2ML IV SOLN
INTRAVENOUS | Status: DC | PRN
  Administered 2022-02-13: 200 mg via INTRAVENOUS

## 2022-02-13 MED ORDER — ZINC SULFATE 220 (50 ZN) MG PO CAPS
220.0000 mg | ORAL_CAPSULE | Freq: Every day | ORAL | Status: DC
  Administered 2022-02-13 – 2022-02-15 (×3): 220 mg via ORAL
  Filled 2022-02-13 (×4): qty 1

## 2022-02-13 MED ORDER — METOCLOPRAMIDE HCL 5 MG/ML IJ SOLN: 5.0000 mg | Freq: Three times a day (TID) | INTRAMUSCULAR | Status: DC | PRN

## 2022-02-13 MED ORDER — POTASSIUM CHLORIDE IN NACL 20-0.9 MEQ/L-% IV SOLN
INTRAVENOUS | Status: DC
  Filled 2022-02-13: qty 1000

## 2022-02-13 MED ORDER — ONDANSETRON HCL 4 MG/2ML IJ SOLN: 4.0000 mg | Freq: Four times a day (QID) | INTRAMUSCULAR | Status: DC | PRN

## 2022-02-13 MED ORDER — PHENYLEPHRINE HCL-NACL 20-0.9 MG/250ML-% IV SOLN
INTRAVENOUS | Status: DC | PRN
  Administered 2022-02-13: 15 ug/min via INTRAVENOUS

## 2022-02-13 MED ORDER — METOCLOPRAMIDE HCL 5 MG PO TABS: 5.0000 mg | ORAL_TABLET | Freq: Three times a day (TID) | ORAL | Status: DC | PRN

## 2022-02-13 MED ORDER — MICONAZOLE NITRATE POWD: Freq: Three times a day (TID) | Status: DC

## 2022-02-13 SURGICAL SUPPLY — 85 items
BAG COUNTER SPONGE SURGICOUNT (BAG) ×2 IMPLANT
BIT DRILL 2.5X2.75 QC CALB (BIT) ×1 IMPLANT
BIT DRILL 3.5X5.5 QC CALB (BIT) ×1 IMPLANT
BIT DRILL 4.3 (BIT) ×2
BIT DRILL 4.3X300MM (BIT) IMPLANT
BIT DRILL LONG 3.3 (BIT) ×1 IMPLANT
BIT DRILL QC 3.3X195 (BIT) ×1 IMPLANT
BLADE CLIPPER SURG (BLADE) IMPLANT
BNDG ELASTIC 4X5.8 VLCR STR LF (GAUZE/BANDAGES/DRESSINGS) ×2 IMPLANT
BNDG ELASTIC 6X5.8 VLCR STR LF (GAUZE/BANDAGES/DRESSINGS) ×2 IMPLANT
BNDG GAUZE ELAST 4 BULKY (GAUZE/BANDAGES/DRESSINGS) ×2 IMPLANT
BRUSH SCRUB EZ PLAIN DRY (MISCELLANEOUS) ×4 IMPLANT
CANISTER SUCT 3000ML PPV (MISCELLANEOUS) ×2 IMPLANT
CAP LOCK NCB (Cap) ×7 IMPLANT
COVER SURGICAL LIGHT HANDLE (MISCELLANEOUS) ×2 IMPLANT
DRAPE C-ARM 42X72 X-RAY (DRAPES) ×2 IMPLANT
DRAPE C-ARMOR (DRAPES) ×2 IMPLANT
DRAPE IMP U-DRAPE 54X76 (DRAPES) ×2 IMPLANT
DRAPE ORTHO SPLIT 77X108 STRL (DRAPES) ×6
DRAPE SURG ORHT 6 SPLT 77X108 (DRAPES) ×3 IMPLANT
DRAPE U-SHAPE 47X51 STRL (DRAPES) ×2 IMPLANT
DRESSING MEPILEX FLEX 4X4 (GAUZE/BANDAGES/DRESSINGS) IMPLANT
DRSG ADAPTIC 3X8 NADH LF (GAUZE/BANDAGES/DRESSINGS) ×1 IMPLANT
DRSG MEPILEX BORDER 4X8 (GAUZE/BANDAGES/DRESSINGS) ×1 IMPLANT
DRSG MEPILEX FLEX 4X4 (GAUZE/BANDAGES/DRESSINGS) ×2
DRSG PAD ABDOMINAL 8X10 ST (GAUZE/BANDAGES/DRESSINGS) ×4 IMPLANT
ELECT REM PT RETURN 9FT ADLT (ELECTROSURGICAL) ×2
ELECTRODE REM PT RTRN 9FT ADLT (ELECTROSURGICAL) ×1 IMPLANT
EVACUATOR 1/8 PVC DRAIN (DRAIN) IMPLANT
EVACUATOR 3/16  PVC DRAIN (DRAIN)
EVACUATOR 3/16 PVC DRAIN (DRAIN) IMPLANT
GAUZE SPONGE 4X4 12PLY STRL (GAUZE/BANDAGES/DRESSINGS) ×2 IMPLANT
GLOVE SRG 8 PF TXTR STRL LF DI (GLOVE) ×1 IMPLANT
GLOVE SURG ENC MOIS LTX SZ8 (GLOVE) ×2 IMPLANT
GLOVE SURG ORTHO LTX SZ7.5 (GLOVE) ×4 IMPLANT
GLOVE SURG UNDER POLY LF SZ7.5 (GLOVE) ×2 IMPLANT
GLOVE SURG UNDER POLY LF SZ8 (GLOVE) ×2
GOWN STRL REUS W/ TWL LRG LVL3 (GOWN DISPOSABLE) ×2 IMPLANT
GOWN STRL REUS W/ TWL XL LVL3 (GOWN DISPOSABLE) ×1 IMPLANT
GOWN STRL REUS W/TWL LRG LVL3 (GOWN DISPOSABLE) ×4
GOWN STRL REUS W/TWL XL LVL3 (GOWN DISPOSABLE) ×2
K-WIRE 2.0 (WIRE) ×8
K-WIRE FXSTD 280X2XNS SS (WIRE) ×4
KIT BASIN OR (CUSTOM PROCEDURE TRAY) ×2 IMPLANT
KIT TURNOVER KIT B (KITS) ×2 IMPLANT
KWIRE FXSTD 280X2XNS SS (WIRE) IMPLANT
NEEDLE 22X1 1/2 (OR ONLY) (NEEDLE) IMPLANT
NS IRRIG 1000ML POUR BTL (IV SOLUTION) ×2 IMPLANT
PACK TOTAL JOINT (CUSTOM PROCEDURE TRAY) ×2 IMPLANT
PACK UNIVERSAL I (CUSTOM PROCEDURE TRAY) ×2 IMPLANT
PAD ARMBOARD 7.5X6 YLW CONV (MISCELLANEOUS) ×4 IMPLANT
PAD CAST 4YDX4 CTTN HI CHSV (CAST SUPPLIES) ×1 IMPLANT
PADDING CAST COTTON 4X4 STRL (CAST SUPPLIES) ×2
PADDING CAST COTTON 6X4 STRL (CAST SUPPLIES) ×2 IMPLANT
PLATE BONE LOCK 238MM 9HOLE (Plate) ×1 IMPLANT
SCREW 5.0 70MM (Screw) ×1 IMPLANT
SCREW 5.0 80MM (Screw) ×1 IMPLANT
SCREW CORTICAL 3.5MM 40MM (Screw) ×1 IMPLANT
SCREW CORTICAL NCB 5.0X65 (Screw) ×1 IMPLANT
SCREW LOCK FEM THRD PFX 4X34 (Screw) ×1 IMPLANT
SCREW NCB 3.5X75X5X6.2XST (Screw) IMPLANT
SCREW NCB 5.0X26MM (Screw) ×1 IMPLANT
SCREW NCB 5.0X28MM (Screw) ×2 IMPLANT
SCREW NCB 5.0X34MM (Screw) ×2 IMPLANT
SCREW NCB 5.0X36MM (Screw) ×1 IMPLANT
SCREW NCB 5.0X75MM (Screw) ×2 IMPLANT
SPONGE T-LAP 18X18 ~~LOC~~+RFID (SPONGE) ×2 IMPLANT
STAPLER VISISTAT 35W (STAPLE) ×2 IMPLANT
SUCTION FRAZIER HANDLE 10FR (MISCELLANEOUS) ×2
SUCTION TUBE FRAZIER 10FR DISP (MISCELLANEOUS) ×1 IMPLANT
SUT ETHILON 2 0 FS 18 (SUTURE) ×2 IMPLANT
SUT ETHILON 4 0 PS 2 18 (SUTURE) ×1 IMPLANT
SUT PROLENE 0 CT 2 (SUTURE) IMPLANT
SUT VIC AB 0 CT1 27 (SUTURE) ×4
SUT VIC AB 0 CT1 27XBRD ANBCTR (SUTURE) ×2 IMPLANT
SUT VIC AB 1 CT1 27 (SUTURE) ×6
SUT VIC AB 1 CT1 27XBRD ANBCTR (SUTURE) ×2 IMPLANT
SUT VIC AB 2-0 CT1 27 (SUTURE) ×8
SUT VIC AB 2-0 CT1 TAPERPNT 27 (SUTURE) ×2 IMPLANT
SUT VIC AB CT1 27XBRD ANBCTRL (SUTURE) ×2
SYR 20ML ECCENTRIC (SYRINGE) IMPLANT
TOWEL GREEN STERILE (TOWEL DISPOSABLE) ×4 IMPLANT
TOWEL GREEN STERILE FF (TOWEL DISPOSABLE) ×2 IMPLANT
TRAY FOLEY MTR SLVR 16FR STAT (SET/KITS/TRAYS/PACK) IMPLANT
WATER STERILE IRR 1000ML POUR (IV SOLUTION) ×4 IMPLANT

## 2022-02-13 NOTE — Anesthesia Procedure Notes (Signed)
Procedure Name: Intubation ?Date/Time: 02/13/2022 11:22 AM ?Performed by: Clearnce Sorrel, CRNA ?Pre-anesthesia Checklist: Patient identified, Emergency Drugs available, Suction available and Patient being monitored ?Patient Re-evaluated:Patient Re-evaluated prior to induction ?Oxygen Delivery Method: Circle System Utilized ?Preoxygenation: Pre-oxygenation with 100% oxygen ?Induction Type: IV induction ?Ventilation: Mask ventilation without difficulty ?Laryngoscope Size: Mac and 3 ?Grade View: Grade I ?Tube type: Oral ?Tube size: 7.0 mm ?Number of attempts: 1 ?Airway Equipment and Method: Stylet and Oral airway ?Placement Confirmation: ETT inserted through vocal cords under direct vision, positive ETCO2 and breath sounds checked- equal and bilateral ?Secured at: 23 cm ?Tube secured with: Tape ?Dental Injury: Teeth and Oropharynx as per pre-operative assessment  ? ? ? ? ?

## 2022-02-13 NOTE — Anesthesia Preprocedure Evaluation (Addendum)
Anesthesia Evaluation  ?Patient identified by MRN, date of birth, ID band ?Patient awake ? ? ? ?Reviewed: ?Allergy & Precautions, NPO status , Patient's Chart, lab work & pertinent test results ? ?Airway ?Mallampati: II ? ?TM Distance: >3 FB ?Neck ROM: Full ? ? ? Dental ?no notable dental hx. ? ?  ?Pulmonary ?sleep apnea and Continuous Positive Airway Pressure Ventilation ,  ?  ?Pulmonary exam normal ?breath sounds clear to auscultation ? ? ? ? ? ? Cardiovascular ?Exercise Tolerance: Good ?+ Peripheral Vascular Disease  ?+ dysrhythmias Atrial Fibrillation  ?Rhythm:Irregular Rate:Abnormal ? ?Atrial fibrillation ?Non-specific intra-ventricular conduction delay ?Minimal voltage criteria for LVH, may be normal variant ( Cornell product ) ?Nonspecific ST abnormality ?Abnormal ECG ?No significant change since last tracing ?\ ?Confirmed by Cristopher Peru [phone]) on 05/10/2021 6:40:25 PM ?  ?Neuro/Psych ?PSYCHIATRIC DISORDERS Depression  Neuromuscular disease (Bell's palsy, sciatica)   ? GI/Hepatic ?Neg liver ROS, GERD  Medicated,  ?Endo/Other  ?diabetes, Type 2 ? Renal/GU ?Renal disease  ?negative genitourinary ?  ?Musculoskeletal ? ?(+) Arthritis , Osteoarthritis,   ? Abdominal ?  ?Peds ?negative pediatric ROS ?(+)  Hematology ?negative hematology ROS ?(+)   ?Anesthesia Other Findings ?Breast cancer ? Reproductive/Obstetrics ?negative OB ROS ? ?  ? ? ? ? ? ? ? ? ? ? ? ? ? ?  ?  ? ? ? ? ? ? ? ?Anesthesia Physical ?Anesthesia Plan ? ?ASA: 3 ? ?Anesthesia Plan: General  ? ?Post-op Pain Management: Tylenol PO (pre-op)*  ? ?Induction: Intravenous ? ?PONV Risk Score and Plan: 3 and Treatment may vary due to age or medical condition and Ondansetron ? ?Airway Management Planned: Oral ETT ? ?Additional Equipment: None ? ?Intra-op Plan:  ? ?Post-operative Plan: Extubation in OR ? ?Informed Consent: I have reviewed the patients History and Physical, chart, labs and discussed the procedure including the  risks, benefits and alternatives for the proposed anesthesia with the patient or authorized representative who has indicated his/her understanding and acceptance.  ? ? ? ?Dental advisory given ? ?Plan Discussed with: CRNA, Anesthesiologist and Surgeon ? ?Anesthesia Plan Comments:   ? ? ? ? ? ?Anesthesia Quick Evaluation ? ?

## 2022-02-13 NOTE — Progress Notes (Signed)
Pt with known hx A-Fib. Rate currently 80's-150's with occasional Ventricular Bigeminy on the monitor. BP: 138/71--> 158/55. Dr. Elgie Congo made aware. No new orders currently, Monitoring closely ?

## 2022-02-13 NOTE — Progress Notes (Signed)
PROGRESS NOTE  Heather [name] SEG:[id number] DOB: Sep 22, 1946 DOA: 02/12/2022 PCP: Greig Right, MD  HPI/Recap of past 24 hours: Heather [name] is a 76 y.o. female with medical history significant of Afib, hx of breast Ca, CKD stage 3, T2DM, GERD, OSA, RLS, depression who presented to ED after she slipped in her bathroom and landed on her knees, had immediate pain in both legs and could not get up. Pt ambulates independently. In the ED, VS fairly stable, labs stable and at baseline. Left knee xray: severely displaced left distal femoral periprosthetic fracture. Ortho consulted. Pt admitted for further management.   Today, saw patient prior to surgery, still complaining of significant pain.  Denies any other new complaints.     Assessment/Plan: Principal Problem:   Closed fracture of left distal femur (HCC) Active Problems:   CKD (chronic kidney disease) stage 3, GFR 30-59 ml/min (HCC)   Atrial fibrillation (HCC)   Diabetes mellitus (HCC)   Restless leg   Obstructive apnea   History of ductal carcinoma in situ (DCIS) of breast   Depression   Displaced left distal femoral periprosthetic fracture ([place]) Orthopedics consulted, plan for surgery on 02/13/2022 DVT PPx and pain management as per Ortho PT/OT when able PTA Eliquis on hold   CKD (chronic kidney disease) stage 3b, GFR 30-59 ml/min (HCC) Stable Creatinine 1.4-1.7 baseline Daily BMP   Paroxysmal atrial fibrillation (HCC) Currently rate controlled Continue Toprol, hold Eliquis until okay to restart by Ortho Telemetry   Diabetes mellitus, appears controlled A1c 6.9 in 04/2021 SSI, Semglee, Accu-Cheks, hypoglycemic protocol  Hold home farxiga   Restless leg Continue requip    Depression Continue cymbalta    Obstructive apnea Does not wear cpap as prescribed and declines one here  Obesity Lifestyle modification advised     Estimated body mass index is 30.7 kg/m as calculated from the following:   Height as  of this encounter: '5\' 7"'$  (1.702 m).   Weight as of this encounter: 88.9 kg.     Code Status: Full  Family Communication: None at bedside  Disposition Plan: Status is: Inpatient Remains inpatient appropriate because: Level of care    Consultants: Orthopedics  Procedures: Surgery on 02/13/2022  Antimicrobials: None  DVT prophylaxis: SCDs for now   Objective: Vitals:   02/12/22 2246 02/13/22 0659 02/13/22 0735 02/13/22 1011  BP: 118/89 (!) 114/91 126/74 138/71  Pulse: 92 73 81 81  Resp: '15 13 18 18  '$ Temp:  98.3 F (36.8 C) 99.2 F (37.3 C) 98.9 F (37.2 C)  TempSrc:  Oral Oral Oral  SpO2: 92% 99% 98% 97%  Weight:    88.9 kg  Height:    '5\' 7"'$  (1.702 m)    Intake/Output Summary (Last 24 hours) at 02/13/2022 1326 Last data filed at 02/13/2022 1305 Gross per 24 hour  Intake --  Output 50 ml  Net -50 ml   Filed Weights   02/12/22 1202 02/13/22 1011  Weight: 88.9 kg 88.9 kg    Exam:  General: NAD  Cardiovascular: S1, S2 present Respiratory: CTAB Abdomen: Soft, nontender, nondistended, bowel sounds present, noted abdominal fold rash Musculoskeletal: No RLE pedal edema, LLE with knee immobilizer Skin: Normal Psychiatry: Normal mood     Data Reviewed: CBC: Recent Labs  Lab 02/12/22 1850 02/13/22 0235  WBC 7.7 7.1  NEUTROABS 4.8  --   HGB 14.8 15.6*  HCT 45.9 47.5*  MCV 95.8 94.2  PLT 164 737   Basic Metabolic Panel: Recent Labs  Lab 02/12/22 1850 02/13/22 0235  NA 136 140  K 4.4 4.2  CL 103 105  CO2 19* 23  GLUCOSE 146* 138*  BUN 19 18  CREATININE 1.61* 1.62*  CALCIUM 9.0 9.4   GFR: Estimated Creatinine Clearance: 33.8 mL/min (A) (by C-G formula based on SCr of 1.62 mg/dL (H)). Liver Function Tests: Recent Labs  Lab 02/12/22 1850  AST 46*  ALT 33  ALKPHOS 44  BILITOT 1.2  PROT 6.4*  ALBUMIN 3.5   No results for input(s): LIPASE, AMYLASE in the last 168 hours. No results for input(s): AMMONIA in the last 168 hours. Coagulation  Profile: Recent Labs  Lab 02/12/22 1800  INR 1.1   Cardiac Enzymes: No results for input(s): CKTOTAL, CKMB, CKMBINDEX, TROPONINI in the last 168 hours. BNP (last 3 results) No results for input(s): PROBNP in the last 8760 hours. HbA1C: No results for input(s): HGBA1C in the last 72 hours. CBG: Recent Labs  Lab 02/12/22 2304 02/13/22 0657 02/13/22 0953  GLUCAP 157* 160* 134*   Lipid Profile: No results for input(s): CHOL, HDL, LDLCALC, TRIG, CHOLHDL, LDLDIRECT in the last 72 hours. Thyroid Function Tests: No results for input(s): TSH, T4TOTAL, FREET4, T3FREE, THYROIDAB in the last 72 hours. Anemia Panel: No results for input(s): VITAMINB12, FOLATE, FERRITIN, TIBC, IRON, RETICCTPCT in the last 72 hours. Urine analysis:    Component Value Date/Time   COLORURINE YELLOW 08/14/2014 1336   APPEARANCEUR CLEAR 08/14/2014 1336   LABSPEC 1.015 08/14/2014 1336   PHURINE 5.0 08/14/2014 1336   GLUCOSEU >1000 (A) 08/14/2014 1336   HGBUR NEGATIVE 08/14/2014 1336   BILIRUBINUR NEGATIVE 08/14/2014 1336   KETONESUR NEGATIVE 08/14/2014 1336   PROTEINUR NEGATIVE 08/14/2014 1336   UROBILINOGEN 0.2 08/14/2014 1336   NITRITE NEGATIVE 08/14/2014 1336   LEUKOCYTESUR NEGATIVE 08/14/2014 1336   Sepsis Labs: '@LABRCNTIP'$ (procalcitonin:4,lacticidven:4)  ) Recent Results (from the past 240 hour(s))  Resp Panel by RT-PCR (Flu A&B, Covid) Nasopharyngeal Swab     Status: None   Collection Time: 02/12/22  2:42 PM   Specimen: Nasopharyngeal Swab; Nasopharyngeal(NP) swabs in vial transport medium  Result Value Ref Range Status   SARS Coronavirus 2 by RT PCR NEGATIVE NEGATIVE Final    Comment: (NOTE) SARS-CoV-2 target nucleic acids are NOT DETECTED.  The SARS-CoV-2 RNA is generally detectable in upper respiratory specimens during the acute phase of infection. The lowest concentration of SARS-CoV-2 viral copies this assay can detect is 138 copies/mL. A negative result does not preclude  SARS-Cov-2 infection and should not be used as the sole basis for treatment or other patient management decisions. A negative result may occur with  improper specimen collection/handling, submission of specimen other than nasopharyngeal swab, presence of viral mutation(s) within the areas targeted by this assay, and inadequate number of viral copies(<138 copies/mL). A negative result must be combined with clinical observations, patient history, and epidemiological information. The expected result is Negative.  Fact Sheet for Patients:  EntrepreneurPulse.com.au  Fact Sheet for Healthcare Providers:  IncredibleEmployment.be  This test is no t yet approved or cleared by the [place] FDA and  has been authorized for detection and/or diagnosis of SARS-CoV-2 by FDA under an Emergency Use Authorization (EUA). This EUA will remain  in effect (meaning this test can be used) for the duration of the COVID-19 declaration under Section 564(b)(1) of the Act, 21 U.S.C.section 360bbb-3(b)(1), unless the authorization is terminated  or revoked sooner.       Influenza A by PCR NEGATIVE NEGATIVE Final  Influenza B by PCR NEGATIVE NEGATIVE Final    Comment: (NOTE) The Xpert Xpress SARS-CoV-2/FLU/RSV plus assay is intended as an aid in the diagnosis of influenza from Nasopharyngeal swab specimens and should not be used as a sole basis for treatment. Nasal washings and aspirates are unacceptable for Xpert Xpress SARS-CoV-2/FLU/RSV testing.  Fact Sheet for Patients: EntrepreneurPulse.com.au  Fact Sheet for Healthcare Providers: IncredibleEmployment.be  This test is not yet approved or cleared by the [place] FDA and has been authorized for detection and/or diagnosis of SARS-CoV-2 by FDA under an Emergency Use Authorization (EUA). This EUA will remain in effect (meaning this test can be used) for the duration of  the COVID-19 declaration under Section 564(b)(1) of the Act, 21 U.S.C. section 360bbb-3(b)(1), unless the authorization is terminated or revoked.  Performed at [facility], [place] [address], [place], [place] [address]   Surgical pcr screen     Status: Abnormal   Collection Time: 02/13/22  7:29 AM   Specimen: Nasal Mucosa; Nasal Swab  Result Value Ref Range Status   MRSA, PCR NEGATIVE NEGATIVE Final   Staphylococcus aureus POSITIVE (A) NEGATIVE Final    Comment: (NOTE) The Xpert SA Assay (FDA approved for NASAL specimens in patients 51 years of age and older), is one component of a comprehensive surveillance program. It is not intended to diagnose infection nor to guide or monitor treatment. Performed at [facility], [place] [address], [place], [place] [address]       Studies: CT KNEE LEFT WO CONTRAST  Result Date: 02/12/2022 CLINICAL DATA:  Periprosthetic left knee fracture. Preoperative planning EXAM: CT OF THE LEFT KNEE WITHOUT CONTRAST TECHNIQUE: Multidetector CT imaging of the left knee was performed according to the standard protocol. Multiplanar CT image reconstructions were also generated. RADIATION DOSE REDUCTION: This exam was performed according to the departmental dose-optimization program which includes automated exposure control, adjustment of the mA and/or kV according to patient size and/or use of iterative reconstruction technique. COMPARISON:  X-ray 02/12/2022 FINDINGS: Bones/Joint/Cartilage Postsurgical changes from left total knee arthroplasty. Redemonstration of acute periprosthetic fracture of the distal left femur. Fracture is predominantly obliquely oriented extending from the lateral femoral condyle to the posteromedial aspect of the distal femoral diaphysis approximately 10 cm above the joint line. Fracture is slightly impacted. Approximately 1.3 cm of posterior displacement. The patella, proximal tibia, and proximal fibula are intact without evidence of  fracture. Small knee joint effusion. Ligaments Suboptimally assessed by CT. Muscles and Tendons No definite acute musculotendinous abnormality. Extensor mechanism is largely obscured by artifact. Soft tissues Small posttraumatic hematoma at the fracture site. Atherosclerotic vascular calcifications are present. IMPRESSION: Acute periprosthetic fracture of the distal left femur, as described above. Electronically Signed   By: Davina Poke D.O.   On: 02/12/2022 14:41   DG C-Arm 1-60 Min-No Report  Result Date: 02/13/2022 Fluoroscopy was utilized by the requesting physician.  No radiographic interpretation.   DG C-Arm 1-60 Min-No Report  Result Date: 02/13/2022 Fluoroscopy was utilized by the requesting physician.  No radiographic interpretation.    Scheduled Meds:  [MAR Hold] cholecalciferol  2,000 Units Oral q morning   [MAR Hold] DULoxetine  60 mg Oral q morning   [MAR Hold] insulin aspart  0-9 Units Subcutaneous TID WC   [MAR Hold] insulin glargine-yfgn  20 Units Subcutaneous QHS   [MAR Hold] metoprolol succinate  25 mg Oral BID AC & HS   [MAR Hold] mupirocin ointment  1 application. Nasal BID   [  MAR Hold] rOPINIRole  4 mg Oral QHS   [MAR Hold] rosuvastatin  40 mg Oral QHS   [MAR Hold] sodium chloride flush  3 mL Intravenous Q12H    Continuous Infusions:  [MAR Hold] sodium chloride     lactated ringers 10 mL/hr at 02/13/22 1110   [MAR Hold] methocarbamol (ROBAXIN) IV       LOS: 1 day     Alma Friendly, MD [facility]  If 7PM-7AM, please contact night-coverage [email or web address] 02/13/2022, 1:26 PM

## 2022-02-13 NOTE — Plan of Care (Signed)

## 2022-02-13 NOTE — Op Note (Signed)
02/13/2022 ? ?10:16 PM ? ?PATIENT:  Heather [name]  76 y.o. female ? ?PRE-OPERATIVE DIAGNOSIS:  LEFT PERIPROSTHETIC SUPRACONDYLAR FEMUR FRACTURE ? ?POST-OPERATIVE DIAGNOSIS: LEFT PERIPROSTHETIC SUPRACONDYLAR FEMUR FRACTURE ? ?PROCEDURE:  OPEN REDUCTION INTERNAL FIXATION OF LEFT PERIPROSTHETIC FEMUR FRACTURE WITH BIOMET NCB PLATE ? ?SURGEON:  Altamese [place], MD ? ?PHYSICIAN ASSISTANT: 1. Ainsley Spinner, PA-C; 2. PA Student ? ?ANESTHESIA:   GENERAL ? ?I/O:  Please see record. ? ?SPECIMEN:  No Specimen ? ?TOURNIQUET:  NONE ? ?COMPLICATIONS: NONE ? ?DICTATION: Note written in EPIC ? ?DISPOSITION: TO PACU ? ?CONDITION: STABLE ? ?DELAY START OF DVT PROPHYLAXIS BECAUSE OF BLEEDING RISK: NO ? ?BRIEF SUMMARY OF INDICATION FOR PROCEDURE:  Heather [name] is a 76 y.o. who sustained a left supracondylar femur fracture, which was quite distal and near to the femoral implant, in ground level fall. The risks and benefits of surgery were discussed with the patient and family, including the possibility of infection, nerve injury, vessel injury, wound breakdown, arthritis, symptomatic hardware, DVT/ PE, loss of motion, malunion, nonunion, heart attack, stroke, death, implant loosening, and need for further surgery among others.  These risks were acknowledged and consent provided to proceed. ? ? ?BRIEF SUMMARY OF PROCEDURE:  The patient was taken to the operating room ?where general anesthesia was induced and after receipt of preoperative ?antibiotics.  The left lower extremity was prepped and draped in usual ?sterile fashion.  No tourniquet was used during the procedure.  A radiolucent triangle was placed ?underneath the femur and towel bumps to restore appropriate alignment and length. ?C-arm was brought in to confirm the appropriate position of the distal ?incision.  This was checked on lateral as well.  An incision was then ?made.  Dissection was carried down to the IT band.  It was split in line ?with the incision.  The deep retractor  was placed.  Hematoma evacuated. ?My assistant pulled and maintained traction and dialed in the rotation for ?Alignment with anatomic compression obtained using the colinear clamp. This was secured with a 3.5 mm lag screw.  I then introduced the [employer] NCB plate.  I placed a pin distally parallel to the femoral component and ?joint line of the tibial tray and then checked the position on both AP ?and lateral views proximally, placing a single screw.  This was ?tightened while adjusting distally to make sure that proper alignment ?was maintained throughout. After the plate was apposed distally I then placed a drill bit for the small caliber screw in the shaft. I then used a lag screw temporarily to further dial in the reduction. This would be removed after all other hardware placed so as to create a bridge construct. I then placed multiple screws in the articular block, checking their trajectory ?and alignment with fluoro.  Additional standard screws were placed proximally.  Locking caps were placed over the heads of the screws distally and all but the most proximal screw in the proximal segment, as well, after confirming appropriate position of all screws on orthogonal views. I was unable to remove the 3.5 mm lag screw because of plate position. Wounds were irrigated thoroughly and then closed in standard layered fashion using #1 Vicryl for the tensor, 0 Vicryl for the deep subcu, 2-0 Vicryl and 3-0 vertical mattress sutures for the skin.  A sterile gently compressive dressing was then applied with an Ace wrap from foot to thigh as well as a knee immobilizer until the patient wakes up adequately from anesthesia at which time she  will be allowed unrestricted range of motion. Ainsley Spinner, PA-C, assisted me throughout as did a PA student and an assistant was necessary to obtain and maintain reduction during provisional and definitive fixation and also assisted with wound closure.  ? ?PROGNOSIS:  Because of comorbidities  and increased BMI, patient is at increased risk for perioperative complications. PT/ OT to assist with nonweightbearing and unrestricted range of motion of the knee without bracing.  Formal pharmacologic DVT prophylaxis with Lovenox. F/u in 10-14 days for removal of sutures. ? ? ? ? ?Astrid Divine. Marcelino Scot, M.D.  ?

## 2022-02-13 NOTE — Transfer of Care (Signed)
Immediate Anesthesia Transfer of Care Note ? ?Patient: Heather [name] ? ?Procedure(s) Performed: OPEN REDUCTION INTERNAL FIXATION (ORIF) DISTAL FEMUR FRACTURE (Left: Leg Upper) ? ?Patient Location: PACU ? ?Anesthesia Type:General ? ?Level of Consciousness: awake, alert  and oriented ? ?Airway & Oxygen Therapy: Patient Spontanous Breathing ? ?Post-op Assessment: Report given to RN and Post -op Vital signs reviewed and stable ? ?Post vital signs: Reviewed and stable ? ?Last Vitals:  ?Vitals Value Taken Time  ?BP 159/92 02/13/22 1345  ?Temp 37.1 ?C 02/13/22 1345  ?Pulse 69 02/13/22 1345  ?Resp 12 02/13/22 1345  ?SpO2 94 % 02/13/22 1345  ?Vitals shown include unvalidated device data. ? ?Last Pain:  ?Vitals:  ? 02/13/22 1011  ?TempSrc: Oral  ?PainSc:   ?   ? ?  ? ?Complications: No notable events documented. ?

## 2022-02-14 DIAGNOSIS — S72402A Unspecified fracture of lower end of left femur, initial encounter for closed fracture: Secondary | ICD-10-CM | POA: Diagnosis not present

## 2022-02-14 LAB — GLUCOSE, CAPILLARY
Glucose-Capillary: 316 mg/dL — ABNORMAL HIGH (ref 70–99)
Glucose-Capillary: 248 mg/dL — ABNORMAL HIGH (ref 70–99)
Glucose-Capillary: 343 mg/dL — ABNORMAL HIGH (ref 70–99)
Glucose-Capillary: 222 mg/dL — ABNORMAL HIGH (ref 70–99)

## 2022-02-14 LAB — CBC WITH DIFFERENTIAL/PLATELET
Abs Immature Granulocytes: 0.02 10*3/uL (ref 0.00–0.07)
Basophils Absolute: 0 10*3/uL (ref 0.0–0.1)
Basophils Relative: 0 %
Eosinophils Absolute: 0 10*3/uL (ref 0.0–0.5)
Eosinophils Relative: 0 %
HCT: 36.9 % (ref 36.0–46.0)
Hemoglobin: 11.9 g/dL — ABNORMAL LOW (ref 12.0–15.0)
Immature Granulocytes: 0 %
Lymphocytes Relative: 15 %
Lymphs Abs: 1.3 10*3/uL (ref 0.7–4.0)
MCH: 30.4 pg (ref 26.0–34.0)
MCHC: 32.2 g/dL (ref 30.0–36.0)
MCV: 94.4 fL (ref 80.0–100.0)
Monocytes Absolute: 0.9 10*3/uL (ref 0.1–1.0)
Monocytes Relative: 11 %
Neutro Abs: 6.2 10*3/uL (ref 1.7–7.7)
Neutrophils Relative %: 74 %
Platelets: 157 10*3/uL (ref 150–400)
RBC: 3.91 MIL/uL (ref 3.87–5.11)
RDW: 12.7 % (ref 11.5–15.5)
WBC: 8.5 10*3/uL (ref 4.0–10.5)
nRBC: 0 % (ref 0.0–0.2)

## 2022-02-14 LAB — COMPREHENSIVE METABOLIC PANEL
ALT: 19 U/L (ref 0–44)
AST: 25 U/L (ref 15–41)
Albumin: UNDETERMINED g/dL (ref 3.5–5.0)
Alkaline Phosphatase: 36 U/L — ABNORMAL LOW (ref 38–126)
Anion gap: 13 (ref 5–15)
BUN: 25 mg/dL — ABNORMAL HIGH (ref 8–23)
CO2: 19 mmol/L — ABNORMAL LOW (ref 22–32)
Calcium: 8.2 mg/dL — ABNORMAL LOW (ref 8.9–10.3)
Chloride: 102 mmol/L (ref 98–111)
Creatinine, Ser: UNDETERMINED mg/dL (ref 0.44–1.00)
Glucose, Bld: UNDETERMINED mg/dL (ref 70–99)
Potassium: 5.1 mmol/L (ref 3.5–5.1)
Sodium: 134 mmol/L — ABNORMAL LOW (ref 135–145)
Total Bilirubin: 0.3 mg/dL (ref 0.3–1.2)
Total Protein: UNDETERMINED g/dL (ref 6.5–8.1)

## 2022-02-14 LAB — MAGNESIUM: Magnesium: 2 mg/dL (ref 1.7–2.4)

## 2022-02-14 LAB — PHOSPHORUS: Phosphorus: UNDETERMINED mg/dL (ref 2.5–4.6)

## 2022-02-14 LAB — VITAMIN D 25 HYDROXY (VIT D DEFICIENCY, FRACTURES): Vit D, 25-Hydroxy: 31.21 ng/mL (ref 30–100)

## 2022-02-14 MED ORDER — INSULIN GLARGINE-YFGN 100 UNIT/ML ~~LOC~~ SOLN
30.0000 [IU] | Freq: Every day | SUBCUTANEOUS | Status: DC
  Administered 2022-02-14: 30 [IU] via SUBCUTANEOUS
  Filled 2022-02-14 (×2): qty 0.3

## 2022-02-14 MED ORDER — SENNA 8.6 MG PO TABS
1.0000 | ORAL_TABLET | Freq: Two times a day (BID) | ORAL | Status: DC
  Administered 2022-02-14 – 2022-02-15 (×3): 8.6 mg via ORAL
  Filled 2022-02-14 (×3): qty 1

## 2022-02-14 MED ORDER — POLYETHYLENE GLYCOL 3350 17 G PO PACK
17.0000 g | PACK | Freq: Every day | ORAL | Status: DC
  Administered 2022-02-14 – 2022-02-15 (×2): 17 g via ORAL
  Filled 2022-02-14 (×2): qty 1

## 2022-02-14 MED ORDER — APIXABAN 5 MG PO TABS
5.0000 mg | ORAL_TABLET | Freq: Two times a day (BID) | ORAL | Status: DC
  Administered 2022-02-14 – 2022-02-15 (×3): 5 mg via ORAL
  Filled 2022-02-14 (×3): qty 1

## 2022-02-14 MED ORDER — INSULIN ASPART 100 UNIT/ML IJ SOLN
3.0000 [IU] | Freq: Three times a day (TID) | INTRAMUSCULAR | Status: DC
  Administered 2022-02-14 – 2022-02-15 (×4): 3 [IU] via SUBCUTANEOUS

## 2022-02-14 MED ORDER — INSULIN ASPART 100 UNIT/ML IJ SOLN
0.0000 [IU] | Freq: Three times a day (TID) | INTRAMUSCULAR | Status: DC
  Administered 2022-02-14: 5 [IU] via SUBCUTANEOUS
  Administered 2022-02-14: 11 [IU] via SUBCUTANEOUS
  Administered 2022-02-15: 09:00:00 2 [IU] via SUBCUTANEOUS
  Administered 2022-02-15: 12:00:00 3 [IU] via SUBCUTANEOUS

## 2022-02-14 NOTE — Evaluation (Signed)
Physical Therapy Evaluation Patient Details Name: Heather [name] MRN: [id number] DOB: 1946-06-29 Today's Date: 02/14/2022  History of Present Illness  Pt is a 76 y.o. female admitted 02/12/22 after fall from slipping on bathroom floor. Workup revealed L distal femoral periprosthetic fx s/p L femur ORIF 3/7. PMH includes DM, neuropathy, retinopathy, sciatica, breast CA, CKD 3, RLS, OA, depression, bilateral TKA, THA (2009), L ankle fx s/p ORIF, R hip fx s/p THA (04/2021).   Clinical Impression  Pt presents with an overall decrease in functional mobility secondary to above. PTA, pt reports mod indep with ambulation, uses lift chair for standing, husband assists with LB ADLs as able. Educ on LLE precautions, positioning, activity recommendations. Today, pt requiring mod-maxA+2 for standing trials with RW; limited by pain, generalized weakness and significant fear of falling. Pt would benefit from continued acute PT services to maximize functional mobility and independence prior to d/c with SNF-level therapies.      Recommendations for follow up therapy are one component of a multi-disciplinary discharge planning process, led by the attending physician.  Recommendations may be updated based on patient status, additional functional criteria and insurance authorization.  Follow Up Recommendations Skilled nursing-short term rehab (<3 hours/day) - reports preference for [facility]    Assistance Recommended at Discharge Frequent or constant Supervision/Assistance  Patient can return home with the following  Two people to help with walking and/or transfers;A lot of help with bathing/dressing/bathroom;Assistance with cooking/housework;Assist for transportation;Help with stairs or ramp for entrance    Equipment Recommendations  (defer to next venue)  Recommendations for Other Services       Functional Status Assessment Patient has had a recent decline in their functional status and demonstrates the  ability to make significant improvements in function in a reasonable and predictable amount of time.     Precautions / Restrictions Precautions Precautions: Fall;Other (comment) Precaution Comments: fear of falling, incontinence Restrictions Weight Bearing Restrictions: Yes LLE Weight Bearing: Non weight bearing      Mobility  Bed Mobility Overal bed mobility: Needs Assistance Bed Mobility: Supine to Sit, Sit to Supine     Supine to sit: Mod assist, +2 for physical assistance Sit to supine: Max assist, +2 for physical assistance   General bed mobility comments: ModA+2 for LLE management, trunk elevation and scooting hips to EOB; significant increased time and effort, pt with pain and anxiety    Transfers Overall transfer level: Needs assistance Equipment used: Rolling walker (2 wheels) Transfers: Sit to/from Stand Sit to Stand: Mod assist, Max assist, +2 physical assistance, From elevated surface           General transfer comment: 2x unsuccessful attempts standing from low bed height; bed elevated, repeated verbal cues for sequencing and hand placement, pt ultimately able to achieve 2x sit<>stand with mod-maxA+2 while maintaining LLE NWB precautions; prolonged seated rest between standing trials, poor eccentric control to sitting; external assist to aid maintaining LLE NWB    Ambulation/Gait                  Stairs            Wheelchair Mobility    Modified Rankin (Stroke Patients Only)       Balance Overall balance assessment: Needs assistance Sitting-balance support: Feet supported, Bilateral upper extremity supported Sitting balance-Leahy Scale: Fair Sitting balance - Comments: requires assist to don socks   Standing balance support: During functional activity, Reliant on assistive device for balance Standing balance-Leahy Scale: Poor Standing balance  comment: heavy reliance on BUE support and external assist; posterior lean also requiring  assist to stabilize walker                             Pertinent Vitals/Pain Pain Assessment Pain Assessment: Faces Faces Pain Scale: Hurts even more Pain Location: LLE Pain Descriptors / Indicators: Discomfort, Grimacing, Guarding, Crying Pain Intervention(s): Monitored during session, Limited activity within patient's tolerance    Home Living Family/patient expects to be discharged to:: Private residence Living Arrangements: Spouse/significant other Available Help at Discharge: Family;Available 24 hours/day (spouse cannot provide physical assistance) Type of Home: House Home Access: Stairs to enter Entrance Stairs-Rails: Left Entrance Stairs-Number of Steps: 3   Home Layout: One level Home Equipment: Conservation officer, nature (2 wheels);Cane - single point;BSC/3in1;Other (comment);Shower seat;Hand held shower head;Wheelchair - manual (lift chair)      Prior Function Prior Level of Function : Independent/Modified Independent             Mobility Comments: no AD use ADLs Comments: Mod indep for majority of ADLs, requires assist to reach LB     Hand Dominance   Dominant Hand: Right    Extremity/Trunk Assessment   Upper Extremity Assessment Upper Extremity Assessment: Generalized weakness    Lower Extremity Assessment Lower Extremity Assessment: Generalized weakness;LLE deficits/detail LLE Deficits / Details: s/p L femur ORIF with expected post-op pain and weakness, hip and knee functionally <3/5 LLE: Unable to fully assess due to pain    Cervical / Trunk Assessment Cervical / Trunk Assessment: Normal  Communication   Communication: No difficulties  Cognition Arousal/Alertness: Awake/alert Behavior During Therapy: WFL for tasks assessed/performed, Anxious Overall Cognitive Status: No family/caregiver present to determine baseline cognitive functioning                                 General Comments: Pt anxious with mobility, admits to fear of  falling affecting her confidence; pt following simple commands, but internally distracted asking multiple questions and requiring frequent redirection to current task. Pt continues to mix up precautions ("so I can't bend my knee when I walk" - despite multiple bouts of educating on NWB precautions but no ROM precautions); at end of session, pt asking if she can get up by herself despite significant difficulty standing with assist +2        General Comments General comments (skin integrity, edema, etc.): multiple bouts of educating on LLE precautions (NWB; unlimited knee ROM); pt requiring frequent reminders throughout session regarding these. pt reports plan for SNF (prefers [facility])    Exercises     Assessment/Plan    PT Assessment Patient needs continued PT services  PT Problem List Decreased strength;Decreased range of motion;Decreased activity tolerance;Decreased balance;Decreased mobility;Decreased cognition;Decreased knowledge of use of DME;Decreased safety awareness;Decreased knowledge of precautions;Obesity;Pain       PT Treatment Interventions DME instruction;Gait training;Functional mobility training;Therapeutic activities;Therapeutic exercise;Balance training;Patient/family education;Wheelchair mobility training    PT Goals (Current goals can be found in the Care Plan section)  Acute Rehab PT Goals Patient Stated Goal: "I was told I'd be up and walking today" PT Goal Formulation: With patient Time For Goal Achievement: 02/28/22 Potential to Achieve Goals: Fair    Frequency Min 3X/week     Co-evaluation PT/OT/SLP Co-Evaluation/Treatment: Yes Reason for Co-Treatment: For patient/therapist safety;To address functional/ADL transfers PT goals addressed during session: Mobility/safety with mobility;Balance;Proper use of DME OT goals  addressed during session: ADL's and self-care       AM-PAC PT "6 Clicks" Mobility  Outcome Measure Help needed turning from your back to  your side while in a flat bed without using bedrails?: A Lot Help needed moving from lying on your back to sitting on the side of a flat bed without using bedrails?: Total Help needed moving to and from a bed to a chair (including a wheelchair)?: Total Help needed standing up from a chair using your arms (e.g., wheelchair or bedside chair)?: Total Help needed to walk in hospital room?: Total Help needed climbing 3-5 steps with a railing? : Total 6 Click Score: 7    End of Session Equipment Utilized During Treatment: Gait belt Activity Tolerance: Patient tolerated treatment well;Patient limited by pain;Other (comment) (limited by anxiety/fear of falling) Patient left: in bed;with call bell/phone within reach;with bed alarm set Nurse Communication: Mobility status;Need for lift equipment PT Visit Diagnosis: Other abnormalities of gait and mobility (R26.89);Muscle weakness (generalized) (M62.81);Pain Pain - Right/Left: Left Pain - part of body: Leg    Time: 1103-1130 PT Time Calculation (min) (ACUTE ONLY): 27 min   Charges:   PT Evaluation $PT Eval Moderate Complexity: 1 Mod         Mabeline Caras, PT, DPT Acute Rehabilitation Services  Pager [phone] Office [phone]  Derry Lory 02/14/2022, 2:16 PM

## 2022-02-14 NOTE — Progress Notes (Signed)
Inpatient Diabetes Program Recommendations ? ?AACE/ADA: New Consensus Statement on Inpatient Glycemic Control (2015) ? ?Target Ranges:  Prepandial:   less than 140 mg/dL ?     Peak postprandial:   less than 180 mg/dL (1-2 hours) ?     Critically ill patients:  140 - 180 mg/dL  ? ?Lab Results  ?Component Value Date  ? GLUCAP 316 (H) 02/14/2022  ? HGBA1C 6.9 (H) 05/08/2021  ? ? ?Review of Glycemic Control ? Latest Reference Range & Units 02/12/22 23:04 02/13/22 06:57 02/13/22 09:53 02/13/22 13:45 02/13/22 15:39 02/13/22 22:36 02/14/22 07:59  ?Glucose-Capillary 70 - 99 mg/dL 157 (H) 160 (H) 134 (H) 148 (H) 206 (H) 297 (H) 316 (H)  ? ?Diabetes history: DM 2 ?Outpatient Diabetes medications:  ?Soliqua 40 units q HS ?Farxiga 10 mg q AM ?Current orders for Inpatient glycemic control:  ?Novolog moderate tid with meals ?Novolog 3 units tid with meals ?Semglee 30 units daily ? ?Inpatient Diabetes Program Recommendations:   ? ?Note blood sugars increased. Patient received Decadron 4 mg x 1 which likely increased blood sugars. Agree with increase in Semglee.  ?Will follow.  ? ?Thanks,  ?Adah Perl, RN, BC-ADM ?Inpatient Diabetes Coordinator ?Pager [phone]  (8a-5p) ? ? ?

## 2022-02-14 NOTE — Progress Notes (Addendum)
Pt called RN requesting tweezers to remove a staple from surgical incision stating "it was sticking up and catching on the blanket".  RN applied additional foam on medial left knee.  In addition, pt told NT that she was "picking at the staple" and NT told pt to not touch incision as it increases risk for infection.  Education provided on the importance of leaving incision intact and not removing staples.  Will continue to monitor and provide education.   ?

## 2022-02-14 NOTE — Hospital Course (Signed)
Heather [name] is a 76 y.o. female with medical history significant of Afib, hx of breast Ca, CKD stage 3, T2DM, GERD, OSA, RLS, depression who presented to ED after she slipped in her bathroom and landed on her knees, had immediate pain in both legs and could not get up. Pt ambulates independently.  She was found to have left distal femoral periprosthetic fracture.  Orthopedics consulted and underwent ORIF on 02/13/2022.  Waiting for PT/OT evaluation ?

## 2022-02-14 NOTE — Anesthesia Postprocedure Evaluation (Signed)
Anesthesia Post Note ? ?Patient: Heather [name] ? ?Procedure(s) Performed: OPEN REDUCTION INTERNAL FIXATION (ORIF) DISTAL FEMUR FRACTURE (Left: Leg Upper) ? ?  ? ?Patient location during evaluation: PACU ?Anesthesia Type: General ?Level of consciousness: awake and alert ?Pain management: pain level controlled ?Vital Signs Assessment: post-procedure vital signs reviewed and stable ?Respiratory status: spontaneous breathing, nonlabored ventilation, respiratory function stable and patient connected to nasal cannula oxygen ?Cardiovascular status: blood pressure returned to baseline and stable ?Postop Assessment: no apparent nausea or vomiting ?Anesthetic complications: no ? ? ?No notable events documented. ? ?Last Vitals:  ?Vitals:  ? 02/13/22 1955 02/14/22 0800  ?BP: 114/61 (!) 110/52  ?Pulse: 73 76  ?Resp: 19 18  ?Temp: 37.1 ?C 36.8 ?C  ?SpO2: 100% 97%  ?  ?Last Pain:  ?Vitals:  ? 02/14/22 0800  ?TempSrc: Oral  ?PainSc:   ? ?Pain Goal: Patients Stated Pain Goal: 3 (02/14/22 3810) ? ?  ?  ?  ?  ?  ?  ?  ? ?Merlinda Frederick ? ? ? ? ?

## 2022-02-14 NOTE — TOC Initial Note (Signed)
Transition of Care (TOC) - Initial/Assessment Note  ? ? ?Patient Details  ?Name: Heather [name] ?MRN: [id number] ?Date of Birth: [birth year] ? ?Transition of Care [facility]) CM/SW Contact:    ?Joanne Chars, LCSW ?Phone Number: ?02/14/2022, 4:14 PM ? ?Clinical Narrative:    CSW spoke with pt regarding DC recommendation for SNF.  Pt in room with her friend April, permission given to speak with April present.  Permission also given to speak with [name].  When CSW brought up SNF, pt stopped CSW and said she has been informed by MD that she will not be going to SNF and that there is a different plan for her rehab.  CSW agreed to check with MD before proceeding.  Pt is vaccinated for covid with one booster.  MD contacted for clarification.               ? ? ?Expected Discharge Plan: [place] ?Barriers to Discharge: Continued Medical Work up ? ? ?Patient Goals and CMS Choice ?Patient states their goals for this hospitalization and ongoing recovery are:: walk independently ?  ?  ? ?Expected Discharge Plan and Services ?Expected Discharge Plan: [place] ?In-house Referral: Clinical Social Work ?  ?Post Acute Care Choice:  (TBD) ?Living arrangements for the past 2 months: [place] ?                ?  ?  ?  ?  ?  ?  ?  ?  ?  ?  ? ?Prior Living Arrangements/Services ?Living arrangements for the past 2 months: [place] ?Lives with:: Spouse ?Patient language and need for interpreter reviewed:: Yes ?Do you feel safe going back to the place where you live?: Yes      ?Need for Family Participation in Patient Care: No (Comment) ?Care giver support system in place?: Yes (comment) ?Current home services: Other (comment) (none) ?Criminal Activity/Legal Involvement Pertinent to Current Situation/Hospitalization: No - Comment as needed ? ?Activities of Daily Living ?  ?  ? ?Permission Sought/Granted ?Permission sought to share information with : Family Supports ?Permission granted  to share information with : Yes, Verbal Permission Granted ? Share Information with NAME: [name] ?   ?   ?   ? ?Emotional Assessment ?Appearance:: Appears stated age ?Attitude/Demeanor/Rapport: Engaged ?Affect (typically observed): Appropriate, Pleasant ?Orientation: : Oriented to Self, Oriented to Place, Oriented to  Time, Oriented to Situation ?Alcohol / Substance Use: Not Applicable ?Psych Involvement: No (comment) ? ?Admission diagnosis:  Closed fracture of left distal femur ([place]) [S72.402A] ?Fall, initial encounter [W19.XXXA] ?Other closed fracture of left femur, unspecified portion of femur, initial encounter ([place]) [S72.8X2A] ?Patient Active Problem List  ? Diagnosis Date Noted  ? Closed fracture of left distal femur ([place]) 02/12/2022  ? CKD (chronic kidney disease) stage 3, GFR 30-59 ml/min (H[facility]) 02/12/2022  ? Depression 02/12/2022  ? S/P right total hip arthroplasty 05/09/2021  ? Type 2 diabetes mellitus with diabetic chronic kidney disease ([place]) 05/08/2021  ? Diabetic peripheral angiopathy ([place]) 05/07/2021  ? Hip fracture ([place]) 05/07/2021  ? History of ductal carcinoma in situ (DCIS) of breast 09/11/2016  ? Fasciculation 04/17/2016  ? Obstructive apnea 04/17/2016  ? Restless leg 04/17/2016  ? Long term current use of anticoagulant 05/30/2015  ? Other long term (current) drug therapy 05/30/2015  ? Atrial fibrillation ([place]) 08/14/2014  ? Type II or unspecified type diabetes mellitus with peripheral circulatory disorders, uncontrolled(250.72) 05/15/2013  ? Diabetic peripheral neuropathy ([place])  05/15/2013  ? Morbid obesity ([place]) 05/15/2013  ? Diabetes mellitus ([place]) 05/15/2013  ? Foot pain 05/15/2013  ? Osteoarthritis   ? Proliferative diabetic retinopathy of both eyes ([place])   ? Bell's palsy   ? ?PCP:  Greig Right, MD ?Pharmacy:   ?[facility] [address] ?[place] [address] ?Phone: [phone] Fax: [phone] ? ?[facility] ?[address] ?[address] ?[place] [address] ?Phone: [phone] Fax: [phone] ? ?[facility] - [place], [place] - [address] ?[address] ?[place] [address] ?Phone: [phone] Fax: [phone] ? ? ? ? ?Social Determinants of Health (SDOH) Interventions ?  ? ?Readmission Risk Interventions ?No flowsheet data found. ? ? ?

## 2022-02-14 NOTE — Progress Notes (Signed)
PROGRESS NOTE  Heather [name]  IWO:[id number] DOB: 09/13/1946 DOA: 02/12/2022 PCP: Greig Right, MD   Brief Narrative: Heather [name] is a 76 y.o. female with medical history significant of Afib, hx of breast Ca, CKD stage 3, T2DM, GERD, OSA, RLS, depression who presented to ED after she slipped in her bathroom and landed on her knees, had immediate pain in both legs and could not get up. Pt ambulates independently.  She was found to have left distal femoral periprosthetic fracture.  Orthopedics consulted and underwent ORIF on 02/13/2022.  Waiting for PT/OT evaluation  Assessment & Plan:  Principal Problem:   Closed fracture of left distal femur (HCC) Active Problems:   CKD (chronic kidney disease) stage 3, GFR 30-59 ml/min (HCC)   Atrial fibrillation (HCC)   Diabetes mellitus (HCC)   Restless leg   Obstructive apnea   History of ductal carcinoma in situ (DCIS) of breast   Depression   Assessment and Plan: * Closed fracture of left distal femur ([place]) 76 year old female with fall and severely displaced left distal femoral periprosthetic fracture -Status post ORIF on 02/13/2022 -Continue pain management, supportive care, bowel regimen -Awaiting PT/OT evaluation  CKD (chronic kidney disease) stage 3, GFR 30-59 ml/min (HCC) Creatinine 1.4-1.7 baseline Stable  Atrial fibrillation (HCC) Rate controlled.  On anticoagulation with Eliquis Continue toprol-xl    Diabetes mellitus ([place]) a1c 6.9 in 04/2021 Takes insulin at home.  Hold farxiga. Hyperglycemic here this morning.  Insulin regimen adjusted  Restless leg Continue requip   Depression Continue cymbalta   History of ductal carcinoma in situ (DCIS) of breast Likely in remission.  We recommend to follow-up with the oncologist as an outpatient if needed  Obstructive apnea Does not wear cpap as prescribed and declines one here   Obesity: BMI of 30.7           DVT prophylaxis:Eliquis    Code Status: Full  Code  Family Communication: None at bedside  Patient status:Inpatient  Patient is from :Home  Anticipated discharge to:SNF  Estimated DC date:in 1-2 days   Consultants: Orthopedics  Procedures:ORIF  Antimicrobials:  Anti-infectives (From admission, onward)    Start     Dose/Rate Route Frequency Ordered Stop   02/13/22 1930  ceFAZolin (ANCEF) IVPB 1 g/50 mL premix        1 g 100 mL/hr over 30 Minutes Intravenous Every 6 hours 02/13/22 1525 02/14/22 0842   02/13/22 1045  ceFAZolin (ANCEF) IVPB 2g/100 mL premix        2 g 200 mL/hr over 30 Minutes Intravenous  Once 02/13/22 1035 02/13/22 1124       Subjective:  Patient seen and examined at the bedside this morning.  Hemodynamically stable.  Comfortable.  Denies any new complaints.  Pain well controlled Objective: Vitals:   02/13/22 1500 02/13/22 1533 02/13/22 1955 02/14/22 0800  BP: 123/70 (!) 110/95 114/61 (!) 110/52  Pulse: 80 75 73 76  Resp: '18 11 19 18  '$ Temp:  98.9 F (37.2 C) 98.7 F (37.1 C) 98.3 F (36.8 C)  TempSrc:  Oral Oral Oral  SpO2: 93%  100% 97%  Weight:      Height:        Intake/Output Summary (Last 24 hours) at 02/14/2022 1036 Last data filed at 02/14/2022 0700 Gross per 24 hour  Intake 1376.12 ml  Output 650 ml  Net 726.12 ml   Filed Weights   02/12/22 1202 02/13/22 1011  Weight: 88.9 kg 88.9 kg  Examination:  General exam: Overall comfortable, not in distress,obese, pleasant female HEENT: PERRL Respiratory system:  no wheezes or crackles  Cardiovascular system: S1 & S2 heard, RRR.  Gastrointestinal system: Abdomen is nondistended, soft and nontender. Central nervous system: Alert and oriented Extremities: No edema, no clubbing ,no cyanosis, surgical wound on the left distal thigh Skin: No rashes, no ulcers,no icterus     Data Reviewed: I have personally reviewed following labs and imaging studies  CBC: Recent Labs  Lab 02/12/22 1850 02/13/22 0235 02/14/22 0646  WBC 7.7  7.1 8.5  NEUTROABS 4.8  --  6.2  HGB 14.8 15.6* 11.9*  HCT 45.9 47.5* 36.9  MCV 95.8 94.2 94.4  PLT 164 173 623   Basic Metabolic Panel: Recent Labs  Lab 02/12/22 1850 02/13/22 0235 02/14/22 0450  NA 136 140 134*  K 4.4 4.2 5.1  CL 103 105 102  CO2 19* 23 19*  GLUCOSE 146* 138* QUANTITY NOT SUFFICIENT, UNABLE TO PERFORM TEST  BUN 19 18 25*  CREATININE 1.61* 1.62* QUANTITY NOT SUFFICIENT, UNABLE TO PERFORM TEST  CALCIUM 9.0 9.4 8.2*  MG  --   --  2.0  PHOS  --   --  QUANTITY NOT SUFFICIENT, UNABLE TO PERFORM TEST     Recent Results (from the past 240 hour(s))  Resp Panel by RT-PCR (Flu A&B, Covid) Nasopharyngeal Swab     Status: None   Collection Time: 02/12/22  2:42 PM   Specimen: Nasopharyngeal Swab; Nasopharyngeal(NP) swabs in vial transport medium  Result Value Ref Range Status   SARS Coronavirus 2 by RT PCR NEGATIVE NEGATIVE Final    Comment: (NOTE) SARS-CoV-2 target nucleic acids are NOT DETECTED.  The SARS-CoV-2 RNA is generally detectable in upper respiratory specimens during the acute phase of infection. The lowest concentration of SARS-CoV-2 viral copies this assay can detect is 138 copies/mL. A negative result does not preclude SARS-Cov-2 infection and should not be used as the sole basis for treatment or other patient management decisions. A negative result may occur with  improper specimen collection/handling, submission of specimen other than nasopharyngeal swab, presence of viral mutation(s) within the areas targeted by this assay, and inadequate number of viral copies(<138 copies/mL). A negative result must be combined with clinical observations, patient history, and epidemiological information. The expected result is Negative.  Fact Sheet for Patients:  EntrepreneurPulse.com.au  Fact Sheet for Healthcare Providers:  IncredibleEmployment.be  This test is no t yet approved or cleared by the [place] FDA and   has been authorized for detection and/or diagnosis of SARS-CoV-2 by FDA under an Emergency Use Authorization (EUA). This EUA will remain  in effect (meaning this test can be used) for the duration of the COVID-19 declaration under Section 564(b)(1) of the Act, 21 U.S.C.section 360bbb-3(b)(1), unless the authorization is terminated  or revoked sooner.       Influenza A by PCR NEGATIVE NEGATIVE Final   Influenza B by PCR NEGATIVE NEGATIVE Final    Comment: (NOTE) The Xpert Xpress SARS-CoV-2/FLU/RSV plus assay is intended as an aid in the diagnosis of influenza from Nasopharyngeal swab specimens and should not be used as a sole basis for treatment. Nasal washings and aspirates are unacceptable for Xpert Xpress SARS-CoV-2/FLU/RSV testing.  Fact Sheet for Patients: EntrepreneurPulse.com.au  Fact Sheet for Healthcare Providers: IncredibleEmployment.be  This test is not yet approved or cleared by the [place] FDA and has been authorized for detection and/or diagnosis of SARS-CoV-2 by FDA under an Emergency Use Authorization (EUA).  This EUA will remain in effect (meaning this test can be used) for the duration of the COVID-19 declaration under Section 564(b)(1) of the Act, 21 U.S.C. section 360bbb-3(b)(1), unless the authorization is terminated or revoked.  Performed at [facility], [place] [address], [place], [place] [address]   Surgical pcr screen     Status: Abnormal   Collection Time: 02/13/22  7:29 AM   Specimen: Nasal Mucosa; Nasal Swab  Result Value Ref Range Status   MRSA, PCR NEGATIVE NEGATIVE Final   Staphylococcus aureus POSITIVE (A) NEGATIVE Final    Comment: (NOTE) The Xpert SA Assay (FDA approved for NASAL specimens in patients 31 years of age and older), is one component of a comprehensive surveillance program. It is not intended to diagnose infection nor to guide or monitor treatment. Performed at [facility], [place] [address] [place], [place] [address]      Radiology Studies: DG Chest 2 View  Result Date: 02/12/2022 CLINICAL DATA:  Fall. EXAM: CHEST - 2 VIEW COMPARISON:  May 07, 2021. FINDINGS: Stable cardiomediastinal silhouette. Both lungs are clear. The visualized skeletal structures are unremarkable. IMPRESSION: No active cardiopulmonary disease. Aortic Atherosclerosis (ICD10-I70.0). Electronically Signed   By: Marijo Conception M.D.   On: 02/12/2022 13:27   CT KNEE LEFT WO CONTRAST  Result Date: 02/12/2022 CLINICAL DATA:  Periprosthetic left knee fracture. Preoperative planning EXAM: CT OF THE LEFT KNEE WITHOUT CONTRAST TECHNIQUE: Multidetector CT imaging of the left knee was performed according to the standard protocol. Multiplanar CT image reconstructions were also generated. RADIATION DOSE REDUCTION: This exam was performed according to the departmental dose-optimization program which includes automated exposure control, adjustment of the mA and/or kV according to patient size and/or use of iterative reconstruction technique. COMPARISON:  X-ray 02/12/2022 FINDINGS: Bones/Joint/Cartilage Postsurgical changes from left total knee arthroplasty. Redemonstration of acute periprosthetic fracture of the distal left femur. Fracture is predominantly obliquely oriented extending from the lateral femoral condyle to the posteromedial aspect of the distal femoral diaphysis approximately 10 cm above the joint line. Fracture is slightly impacted. Approximately 1.3 cm of posterior displacement. The patella, proximal tibia, and proximal fibula are intact without evidence of fracture. Small knee joint effusion. Ligaments Suboptimally assessed by CT. Muscles and Tendons No definite acute musculotendinous abnormality. Extensor mechanism is largely obscured by artifact. Soft tissues Small posttraumatic hematoma at the fracture site. Atherosclerotic vascular calcifications are present. IMPRESSION: Acute periprosthetic fracture  of the distal left femur, as described above. Electronically Signed   By: Davina Poke D.O.   On: 02/12/2022 14:41   DG Knee Complete 4 Views Left  Result Date: 02/12/2022 CLINICAL DATA:  Left knee pain. EXAM: LEFT KNEE - COMPLETE 4+ VIEW COMPARISON:  None. FINDINGS: Status post left total knee arthroplasty. Severely displaced periprosthetic fracture is seen involving the distal left femur. IMPRESSION: Severely displaced left distal femoral periprosthetic fracture. Electronically Signed   By: Marijo Conception M.D.   On: 02/12/2022 13:23   DG Knee Complete 4 Views Right  Result Date: 02/12/2022 CLINICAL DATA:  Right knee pain after fall. EXAM: RIGHT KNEE - COMPLETE 4+ VIEW COMPARISON:  None. FINDINGS: Status post right total knee arthroplasty. The femoral and tibial components are well situated. No fracture, dislocation or effusion is noted. IMPRESSION: No acute abnormality seen. Electronically Signed   By: Marijo Conception M.D.   On: 02/12/2022 13:26   DG Knee Left Port  Result Date: 02/13/2022 CLINICAL DATA:  Left distal femur ORIF.  EXAM: PORTABLE LEFT KNEE - 1-2 VIEW COMPARISON:  CT left knee dated February 12, 2022. FINDINGS: Interval lateral plate and screw fixation of the oblique distal femoral metadiaphyseal fracture, now in anatomic alignment. Lag screw noted. Left total knee arthroplasty. Incompletely visualized left hip arthroplasty femoral stem. Expected postsurgical subcutaneous emphysema in the distal thigh. IMPRESSION: 1. Interval lateral plate and screw fixation of the distal femoral metadiaphyseal fracture, now in anatomic alignment. Electronically Signed   By: Titus Dubin M.D.   On: 02/13/2022 15:23   DG C-Arm 1-60 Min-No Report  Result Date: 02/13/2022 Fluoroscopy was utilized by the requesting physician.  No radiographic interpretation.   DG C-Arm 1-60 Min-No Report  Result Date: 02/13/2022 Fluoroscopy was utilized by the requesting physician.  No radiographic interpretation.    DG FEMUR MIN 2 VIEWS LEFT  Result Date: 02/13/2022 CLINICAL DATA:  ORIF left distal femur EXAM: LEFT FEMUR 2 VIEWS COMPARISON:  02/12/2022 FINDINGS: Multiple C-arm images show lateral plate and screw fixation of a distal femur fracture. Previous total knee replacement. There is restoration of anatomic alignment. IMPRESSION: ORIF of distal femur fracture with restoration of anatomic position and alignment. Electronically Signed   By: Nelson Chimes M.D.   On: 02/13/2022 13:24    Scheduled Meds:  acetaminophen  650 mg Oral Q12H   apixaban  5 mg Oral BID   vitamin C  500 mg Oral Daily   cholecalciferol  2,000 Units Oral BID   docusate sodium  100 mg Oral BID   DULoxetine  60 mg Oral q morning   insulin aspart  0-15 Units Subcutaneous TID WC   insulin aspart  3 Units Subcutaneous TID WC   insulin glargine-yfgn  30 Units Subcutaneous QHS   metoprolol succinate  25 mg Oral BID AC & HS   miconazole nitrate   Topical TID   mupirocin ointment  1 application. Nasal BID   rOPINIRole  4 mg Oral QHS   rosuvastatin  40 mg Oral QHS   sodium chloride flush  3 mL Intravenous Q12H   zinc sulfate  220 mg Oral Daily   Continuous Infusions:  sodium chloride     methocarbamol (ROBAXIN) IV       LOS: 2 days   Shelly Coss, MD [facility] P3/07/2022, 10:36 AM

## 2022-02-14 NOTE — Progress Notes (Addendum)
? ?                              Orthopaedic Trauma Service Progress Note ? ?Patient ID: ?Kandis Cocking ?MRN: [id number] ?DOB/AGE: 04/19/1946 76 y.o. ? ?Subjective: ? ?Doing well this am  ?Pain much better than pre-op  ?Did sleep on left side last night  ? ?We did have a very frank conversation on expectations regarding her recovery.  She was under the impression that this recovery would proceed like her total joint replacements.  I discussed with her at length that this will be much slower and her weightbearing status is not the same as it was post total joint replacements.  She did appreciate this conversation as it put her mindset in a different frame of reference. ? ?She admits that her balance is poor and should likely be using a cane at minimum and likely a walker ?Lives with husband who is not physically capable of caring for her  ? ?Agreeable for snf  ? ?ROS ?As above ? ?Objective:  ? ?VITALS:   ?Vitals:  ? 02/13/22 1500 02/13/22 1533 02/13/22 1955 02/14/22 0800  ?BP: 123/70 (!) 110/95 114/61 (!) 110/52  ?Pulse: 80 75 73 76  ?Resp: '18 11 19 18  '$ ?Temp:  98.9 ?F (37.2 ?C) 98.7 ?F (37.1 ?C) 98.3 ?F (36.8 ?C)  ?TempSrc:  Oral Oral Oral  ?SpO2: 93%  100% 97%  ?Weight:      ?Height:      ? ? ?Estimated body mass index is 30.7 kg/m? as calculated from the following: ?  Height as of this encounter: '5\' 7"'$  (1.702 m). ?  Weight as of this encounter: 88.9 kg. ? ? ?Intake/Output   ?   03/07 0701 ?03/08 0700 03/08 0701 ?03/09 0700  ? P.O. 240   ? I.V. (mL/kg) 1036.1 (11.7)   ? IV Piggyback 100   ? Total Intake(mL/kg) 1376.1 (15.5)   ? Urine (mL/kg/hr) 600 (0.3)   ? Blood 50   ? Total Output 650   ? Net +726.1   ?     ?  ? ?LABS ? ?Results for orders placed or performed during the hospital encounter of 02/12/22 (from the past 24 hour(s))  ?Glucose, capillary     Status: Abnormal  ? Collection Time: 02/13/22  1:45 PM  ?Result Value Ref Range  ? Glucose-Capillary 148 (H) 70 - 99 mg/dL   ?Glucose, capillary     Status: Abnormal  ? Collection Time: 02/13/22  3:39 PM  ?Result Value Ref Range  ? Glucose-Capillary 206 (H) 70 - 99 mg/dL  ?Glucose, capillary     Status: Abnormal  ? Collection Time: 02/13/22 10:36 PM  ?Result Value Ref Range  ? Glucose-Capillary 297 (H) 70 - 99 mg/dL  ?VITAMIN D 25 Hydroxy (Vit-D Deficiency, Fractures)     Status: None  ? Collection Time: 02/14/22  4:50 AM  ?Result Value Ref Range  ? Vit D, 25-Hydroxy 31.21 30 - 100 ng/mL  ?Comprehensive metabolic panel     Status: Abnormal  ? Collection Time: 02/14/22  4:50 AM  ?Result Value Ref Range  ? Sodium 134 (L) 135 - 145 mmol/L  ? Potassium 5.1 3.5 - 5.1 mmol/L  ? Chloride 102 98 - 111 mmol/L  ? CO2 19 (L) 22 - 32 mmol/L  ? Glucose, Bld QUANTITY NOT SUFFICIENT, UNABLE TO PERFORM TEST 70 - 99 mg/dL  ? BUN 25 (H) 8 -  23 mg/dL  ? Creatinine, Ser QUANTITY NOT SUFFICIENT, UNABLE TO PERFORM TEST 0.44 - 1.00 mg/dL  ? Calcium 8.2 (L) 8.9 - 10.3 mg/dL  ? Total Protein QUANTITY NOT SUFFICIENT, UNABLE TO PERFORM TEST 6.5 - 8.1 g/dL  ? Albumin QUANTITY NOT SUFFICIENT, UNABLE TO PERFORM TEST 3.5 - 5.0 g/dL  ? AST 25 15 - 41 U/L  ? ALT 19 0 - 44 U/L  ? Alkaline Phosphatase 36 (L) 38 - 126 U/L  ? Total Bilirubin 0.3 0.3 - 1.2 mg/dL  ? GFR, Estimated NOT CALCULATED >60 mL/min  ? Anion gap 13 5 - 15  ?Magnesium     Status: None  ? Collection Time: 02/14/22  4:50 AM  ?Result Value Ref Range  ? Magnesium 2.0 1.7 - 2.4 mg/dL  ?Phosphorus     Status: None  ? Collection Time: 02/14/22  4:50 AM  ?Result Value Ref Range  ? Phosphorus QUANTITY NOT SUFFICIENT, UNABLE TO PERFORM TEST 2.5 - 4.6 mg/dL  ?CBC with Differential/Platelet     Status: Abnormal  ? Collection Time: 02/14/22  6:46 AM  ?Result Value Ref Range  ? WBC 8.5 4.0 - 10.5 K/uL  ? RBC 3.91 3.87 - 5.11 MIL/uL  ? Hemoglobin 11.9 (L) 12.0 - 15.0 g/dL  ? HCT 36.9 36.0 - 46.0 %  ? MCV 94.4 80.0 - 100.0 fL  ? MCH 30.4 26.0 - 34.0 pg  ? MCHC 32.2 30.0 - 36.0 g/dL  ? RDW 12.7 11.5 - 15.5 %  ?  Platelets 157 150 - 400 K/uL  ? nRBC 0.0 0.0 - 0.2 %  ? Neutrophils Relative % 74 %  ? Neutro Abs 6.2 1.7 - 7.7 K/uL  ? Lymphocytes Relative 15 %  ? Lymphs Abs 1.3 0.7 - 4.0 K/uL  ? Monocytes Relative 11 %  ? Monocytes Absolute 0.9 0.1 - 1.0 K/uL  ? Eosinophils Relative 0 %  ? Eosinophils Absolute 0.0 0.0 - 0.5 K/uL  ? Basophils Relative 0 %  ? Basophils Absolute 0.0 0.0 - 0.1 K/uL  ? Immature Granulocytes 0 %  ? Abs Immature Granulocytes 0.02 0.00 - 0.07 K/uL  ?Glucose, capillary     Status: Abnormal  ? Collection Time: 02/14/22  7:59 AM  ?Result Value Ref Range  ? Glucose-Capillary 316 (H) 70 - 99 mg/dL  ? ? ? ?PHYSICAL EXAM:  ? ?Gen: resting comfortably in bed, very pleasant, NAD  ?Lungs: unlabored  ?Cardiac: RRR ?Abd: + BS  ?Ext:  ?     Left Lower extremity  ? Dressing clean, dry and intact ? Extremity is warm ? Good perfusion distally ? No significant swelling distally ? No DCT ? Distal motor and sensory function intact ? Of note patient lacked full extension in the OR post fixation which I suspect is her baseline.  She lacked about 5 degrees ? ? ?Assessment/Plan: ?1 Day Post-Op  ? ?Principal Problem: ?  Closed fracture of left distal femur ([place]) ?Active Problems: ?  Atrial fibrillation ([place]) ?  Diabetes mellitus ([place]) ?  Obstructive apnea ?  Restless leg ?  History of ductal carcinoma in situ (DCIS) of breast ?  CKD (chronic kidney disease) stage 3, GFR 30-59 ml/min (HCC) ?  Depression ? ? ?Anti-infectives (From admission, onward)  ? ? Start     Dose/Rate Route Frequency Ordered Stop  ? 02/13/22 1930  ceFAZolin (ANCEF) IVPB 1 g/50 mL premix       ? 1 g ?100 mL/hr over 30 Minutes Intravenous Every  6 hours 02/13/22 1525 02/14/22 0842  ? 02/13/22 1045  ceFAZolin (ANCEF) IVPB 2g/100 mL premix       ? 2 g ?200 mL/hr over 30 Minutes Intravenous  Once 02/13/22 1035 02/13/22 1124  ? ?  ?. ? ?POD/HD#: 1 ? ?76 y/o ground-level fall with left periprosthetic distal femur fracture ? ?- fall ? ?-Left periprosthetic distal  femur fracture s/p ORIF  ? NWB L leg x 6-8 weeks ? Unrestricted range of motion left knee ? PT and OT evaluations ? Dressing changes as needed starting tomorrow ? Ice and elevate ? ?PT- please teach HEP for left knee ROM- AROM, PROM. Prone exercises as well. No ROM restrictions.  Quad sets, SLR, LAQ, SAQ, heel slides, stretching, prone flexion and extension ? ?Ankle theraband program, heel cord stretching, toe towel curls, etc ? ?No pillows under bend of knee when at rest, ok to place under heel to help work on extension. Can also use zero knee bone foam if available ?  ? ?- Pain management: ? Multimodal ? ?- ABL anemia/Hemodynamics ? Monitor ? TXA given post op  ? ?- Medical issues  ? Per primary  ? ?- DVT/PE prophylaxis: ? Okay to resume home anticoagulation ? Pharmacy consult placed ?- ID:  ? Perioperative antibiotics ? ?- Metabolic Bone Disease: ?  ?Fracture suggestive of fragility fracture  ?DEXA as an outpatient ?Referral to osteoporosis clinic ? ?- Activity: ? As above ? ?- Impediments to fracture healing: ? Diabetes ? Poor bone quality ? ?- Dispo: ? Therapy evaluations ? TOC consult for SNF ? ? ? ?Jari Pigg, PA-C ?[phone] (C) ?02/14/2022, 10:15 AM ? ?[facility] ?[place] [address] ?[phone] Jenetta Downer) ?[phone] (F) ? ? ? ?After 5pm and on the weekends please log on to Amion, go to orthopaedics and the look under the [facility] Call for the provider(s) on call. You can also call our office at [phone] and then follow the prompts to be connected to the call team.  ? Patient ID: ZULEYKA KLOC, female   DOB: 12/16/1945, 76 y.o.   MRN: [id number] ? ?

## 2022-02-14 NOTE — Evaluation (Signed)
Occupational Therapy Evaluation Patient Details Name: Heather [name] MRN: [id number] DOB: 1946/05/31 Today's Date: 02/14/2022   History of Present Illness Pt is a 76 y.o. female admitted 02/12/22 after fall from slipping on bathroom floor. Workup revealed L distal femoral periprosthetic fx s/p L femur ORIF 3/7. PMH includes DM, neuropathy, retinopathy, sciatica, breast CA, CKD 3, RLS, OA, depression, bilateral TKA, THA (2009), L ankle fx s/p ORIF, R hip fx s/p THA (04/2021).   Clinical Impression   Pt reports independence at baseline with functional mobility, min A for LB ADL from spouse. Pt lives with spouse who cannot provide any physical assistance at home. Pt currently set up -max A for ADLs, mod-max A +2 for bed mobility and sit to stand transfer x2. Educated pt on WB precautions, pt verbalizes understanding however benefits from increased reiteration of LLE ROM. Pt presenting with impairments listed below, will follow acutely. Recommend SNF at d/c.     Recommendations for follow up therapy are one component of a multi-disciplinary discharge planning process, led by the attending physician.  Recommendations may be updated based on patient status, additional functional criteria and insurance authorization.   Follow Up Recommendations  Skilled nursing-short term rehab (<3 hours/day)    Assistance Recommended at Discharge Intermittent Supervision/Assistance  Patient can return home with the following Two people to help with walking and/or transfers;A lot of help with bathing/dressing/bathroom;Help with stairs or ramp for entrance;Assistance with cooking/housework;Assist for transportation    Functional Status Assessment  Patient has had a recent decline in their functional status and demonstrates the ability to make significant improvements in function in a reasonable and predictable amount of time.  Equipment Recommendations  None recommended by OT;Other (comment) (defer to next venue of care)     Recommendations for Other Services       Precautions / Restrictions Precautions Precautions: Fall Precaution Comments: fear of falling, incontinence Restrictions Weight Bearing Restrictions: Yes LLE Weight Bearing: Non weight bearing      Mobility Bed Mobility Overal bed mobility: Needs Assistance Bed Mobility: Supine to Sit, Sit to Supine     Supine to sit: Mod assist, +2 for physical assistance Sit to supine: Max assist, +2 for physical assistance        Transfers Overall transfer level: Needs assistance Equipment used: Rolling walker (2 wheels) Transfers: Sit to/from Stand Sit to Stand: Mod assist, Max assist, +2 physical assistance           General transfer comment: attempted x2      Balance Overall balance assessment: Needs assistance Sitting-balance support: Feet supported, Bilateral upper extremity supported Sitting balance-Leahy Scale: Fair   Postural control: Posterior lean Standing balance support: During functional activity, Reliant on assistive device for balance Standing balance-Leahy Scale: Poor Standing balance comment: posterior lean with standing                           ADL either performed or assessed with clinical judgement   ADL Overall ADL's : Needs assistance/impaired Eating/Feeding: Set up;Sitting   Grooming: Set up;Sitting   Upper Body Bathing: Minimal assistance;Sitting   Lower Body Bathing: Maximal assistance;Sitting/lateral leans   Upper Body Dressing : Minimal assistance;Sitting   Lower Body Dressing: Moderate assistance;Sit to/from stand Lower Body Dressing Details (indicate cue type and reason): to don socks sitting EOB Toilet Transfer: Maximal assistance;+2 for physical assistance;BSC/3in1;Stand-pivot;Moderate assistance;Rolling walker (2 wheels)   Toileting- Clothing Manipulation and Hygiene: Maximal assistance  Functional mobility during ADLs: Maximal assistance;Moderate assistance;+2 for  physical assistance;Rolling walker (2 wheels)       Vision   Vision Assessment?: No apparent visual deficits     Perception     Praxis      Pertinent Vitals/Pain Pain Assessment Pain Assessment: Faces Pain Score: 2  Faces Pain Scale: Hurts a little bit Pain Location: LLE Pain Descriptors / Indicators: Discomfort Pain Intervention(s): Limited activity within patient's tolerance, Monitored during session, Repositioned     Hand Dominance Right   Extremity/Trunk Assessment Upper Extremity Assessment Upper Extremity Assessment: Generalized weakness   Lower Extremity Assessment Lower Extremity Assessment: Defer to PT evaluation   Cervical / Trunk Assessment Cervical / Trunk Assessment: Normal   Communication Communication Communication: No difficulties   Cognition Arousal/Alertness: Awake/alert Behavior During Therapy: WFL for tasks assessed/performed, Restless, Anxious Overall Cognitive Status: No family/caregiver present to determine baseline cognitive functioning                                 General Comments: anxious with mobility, occasionally slightly agitated. Pt with decreased safety awareness, asking if she is able to get up on her own after therapist reiterated that pt call for help if wanting to get up     General Comments  Educated pt on WB precautions and ROM, pt requries increased reminders throughout session    Exercises     Shoulder Instructions      Home Living Family/patient expects to be discharged to:: Private residence Living Arrangements: Spouse/significant other Available Help at Discharge: Family;Available 24 hours/day (spouse cannot provide physical assistance) Type of Home: House Home Access: Stairs to enter [employer] of Steps: 3 Entrance Stairs-Rails: Left Home Layout: One level     Bathroom Shower/Tub: Walk-in shower;Door   Bathroom Toilet: Handicapped height Bathroom Accessibility: No   Home  Equipment: Conservation officer, nature (2 wheels);Cane - single point;BSC/3in1;Other (comment);Shower seat;Hand held shower head;Wheelchair - manual (lift chair)          Prior Functioning/Environment Prior Level of Function : Independent/Modified Independent             Mobility Comments: no AD use ADLs Comments: Mod indep for majority of ADLs, requires assist to reach LB        OT Problem List: Decreased strength;Decreased range of motion;Decreased activity tolerance;Impaired balance (sitting and/or standing);Decreased knowledge of use of DME or AE;Decreased knowledge of precautions;Decreased safety awareness      OT Treatment/Interventions: Self-care/ADL training;Therapeutic exercise;Neuromuscular education;DME and/or AE instruction;Therapeutic activities;Patient/family education;Balance training    OT Goals(Current goals can be found in the care plan section) Acute Rehab OT Goals Patient Stated Goal: to get better OT Goal Formulation: With patient Time For Goal Achievement: 02/28/22 Potential to Achieve Goals: Good ADL Goals Pt Will Perform Upper Body Dressing: with min guard assist;sitting Pt Will Perform Lower Body Dressing: with mod assist;sit to/from stand;sitting/lateral leans Pt Will Transfer to Toilet: with mod assist;bedside commode;stand pivot transfer  OT Frequency: Min 2X/week    Co-evaluation PT/OT/SLP Co-Evaluation/Treatment: Yes Reason for Co-Treatment: For patient/therapist safety;To address functional/ADL transfers PT goals addressed during session: Mobility/safety with mobility;Balance;Proper use of DME OT goals addressed during session: ADL's and self-care      AM-PAC OT "6 Clicks" Daily Activity     Outcome Measure Help from another person eating meals?: None Help from another person taking care of personal grooming?: A Little Help from another person toileting, which includes using toliet,  bedpan, or urinal?: Total Help from another person bathing (including  washing, rinsing, drying)?: A Lot Help from another person to put on and taking off regular upper body clothing?: A Little Help from another person to put on and taking off regular lower body clothing?: Total 6 Click Score: 14   End of Session Equipment Utilized During Treatment: Rolling walker (2 wheels);Gait belt Nurse Communication: Mobility status  Activity Tolerance: Patient limited by fatigue Patient left: in bed;with call bell/phone within reach;with bed alarm set  OT Visit Diagnosis: Other abnormalities of gait and mobility (R26.89);Unsteadiness on feet (R26.81);Muscle weakness (generalized) (M62.81);History of falling (Z91.81)                Time: 8916-9450 OT Time Calculation (min): 32 min Charges:  OT General Charges $OT Visit: 1 Visit OT Evaluation $OT Eval Moderate Complexity: 1 [address] OTD, OTR/L Acute Rehab [phone]) 832 - [place] 02/14/2022, 12:46 PM

## 2022-02-14 NOTE — Assessment & Plan Note (Signed)
Likely in remission.  We recommend to follow-up with the oncologist as an outpatient if needed ?

## 2022-02-14 NOTE — Progress Notes (Signed)
ANTICOAGULATION CONSULT NOTE - Initial Consult ? ?Pharmacy Consult to resume Eliquis on POD #1 if Hgb stable and not requiring blood transfusion ?Indication: history of Afib. ? ?Allergies  ?Allergen Reactions  ? Sulfa Antibiotics Itching  ? Sulfasalazine Itching  ? ? ?Patient Measurements: ?Height: '5\' 7"'$  (170.2 cm) ?Weight: 88.9 kg (196 lb) ?IBW/kg (Calculated) : 61.6 ? ? ?Vital Signs: ?Temp: 98.3 ?F (36.8 ?C) (03/08 0800) ?Temp Source: Oral (03/08 0800) ?BP: 110/52 (03/08 0800) ?Pulse Rate: 76 (03/08 0800) ? ?Labs: ?Recent Labs  ?  02/12/22 ?1800 02/12/22 ?1850 02/12/22 ?1850 02/13/22 ?0235 02/14/22 ?6314 02/14/22 ?9702  ?HGB  --  14.8   < > 15.6*  --  11.9*  ?HCT  --  45.9  --  47.5*  --  36.9  ?PLT  --  164  --  173  --  157  ?LABPROT 13.9  --   --   --   --   --   ?INR 1.1  --   --   --   --   --   ?CREATININE  --  1.61*  --  1.62* QUANTITY NOT SUFFICIENT, UNABLE TO PERFORM TEST  --   ? < > = values in this interval not displayed.  ? ? ?CrCl cannot be calculated (This lab value cannot be used to calculate CrCl because it is not a number: QUANTITY NOT SUFFICIENT, UNABLE TO PERFORM TEST). ? ? ?Medical History: ?Past Medical History:  ?Diagnosis Date  ? Atrial fibrillation ([place]) 08/14/2014  ? Bell's palsy   ? Bimalleolar fracture of left ankle   ? Breast cancer ([place])   ? Diabetes mellitus, type II ([place])   ? Dysrhythmia   ? a-fib  ? GERD (gastroesophageal reflux disease)   ? Low back pain with sciatica   ? Osteoarthritis   ? Proliferative diabetic retinopathy of both eyes ([place])   ? Restless legs 2012  ? Sleep apnea   ? CPAP nightly  ? ? ?Medications:  ?Medications Prior to Admission  ?Medication Sig Dispense Refill Last Dose  ? apixaban (ELIQUIS) 5 MG TABS tablet Take 5 mg by mouth 2 (two) times daily.   02/11/2022 at 2100  ? Cholecalciferol 2000 UNITS TABS Take 2,000 Units by mouth every morning.   02/11/2022  ? dapagliflozin propanediol (FARXIGA) 10 MG TABS tablet Take 10 mg by mouth every morning.   02/11/2022  ?  DULoxetine (CYMBALTA) 60 MG capsule Take 60 mg by mouth every morning.   02/11/2022  ? Insulin Glargine-Lixisenatide (SOLIQUA) 100-33 UNT-MCG/ML SOPN Inject 40 Units into the skin at bedtime.   02/11/2022  ? metoprolol succinate (TOPROL-XL) 50 MG 24 hr tablet Take 25 mg by mouth 2 (two) times daily at 8 am and 10 pm.   02/11/2022 at 2100  ? Multiple Vitamin (MULTIVITAMIN WITH MINERALS) TABS tablet Take 1 tablet by mouth every morning.   02/11/2022  ? Olopatadine HCl (PATADAY OP) Place 1 drop into both eyes daily as needed (allergies/itching).   Past Month  ? Polyethyl Glycol-Propyl Glycol (SYSTANE) 0.4-0.3 % SOLN Place 1 drop into both eyes daily as needed (dry eyes).   02/12/2022  ? Ropinirole HCl 6 MG TB24 Take 6 mg by mouth every morning.   02/11/2022  ? rosuvastatin (CRESTOR) 40 MG tablet Take 40 mg by mouth at bedtime.   02/11/2022  ? tiZANidine (ZANAFLEX) 4 MG tablet Take 4 mg by mouth every 6 (six) hours as needed for muscle spasms.   Past Month  ? traMADol (  ULTRAM) 50 MG tablet Take 50 mg by mouth every 6 (six) hours as needed for moderate pain.   Past Month  ? zolpidem (AMBIEN) 10 MG tablet Take 0.5 tablets (5 mg total) by mouth at bedtime. 30 tablet 0 Past Week  ? polyethylene glycol (MIRALAX / GLYCOLAX) 17 g packet Take 17 g by mouth 2 (two) times daily. (Patient not taking: Reported on 02/12/2022) 14 each 0 Not Taking  ? ? ?Assessment: ? 76 y.o female on eliquis 5 mg po BID prior to admission for history of Atrial fibrillation.  Admitted 02/12/22 after fall>Left distal femoral fracure.   Elquis was held for surgery.  ?S/p ORIF left femur fracture 02/13/22.   Pharmacy consulted to resume Eliquis on POD #1 if Hgb stable and not requiring blood transfusion.   Hgb is 11.9 today POD#1.  Pltc wnl stable    No bleeding reported.  ?  ? ?Goal of Therapy:  ?Stroke prevention due to h/o Afib. ?VTE prophylaxis s/p ORIF  left femur fracture. ? ?  ?Plan:  ?Resume Eliquis 5 mg BID  today POD #1.  ?Pharmacy will sign off. ? ? ?Thank you  for allowing pharmacy to be part of this patients care team. ? ?Nicole Cella, RPh ?Clinical Pharmacist ?02/14/2022,9:18 AM ? ?[phone] ?Please check AMION for all [place] phone numbers ?After 10:00 PM, call [place] [phone] ? ? ?

## 2022-02-14 NOTE — TOC CAGE-AID Note (Signed)
Transition of Care (TOC) - CAGE-AID Screening ? ? ?Patient Details  ?Name: Heather [name] ?MRN: [id number] ?Date of Birth: 11-27-1946 ? ?Transition of Care (TOC) CM/SW Contact:    ?Rola Lennon C Tarpley-Carter, LCSWA ?Phone Number: ?02/14/2022, 12:33 PM ? ? ?Clinical Narrative: ?Pt participated in [place].  Pt stated she does not use substance or ETOH.  Pt was not offered resources, due to no usage of substance or ETOH.    ? ?Passenger transport manager, MSW, LCSW-A ?Pronouns:  She/Her/Hers ?[facility] ?Clinical Social Worker ?Direct Number:  [phone] ?[email or web address]  ? ?CAGE-AID Screening: ?  ? ?Have You Ever Felt You Ought to Cut Down on Your Drinking or Drug Use?: No ?Have People Annoyed You By Critizing Your Drinking Or Drug Use?: No ?Have You Felt Bad Or Guilty About Your Drinking Or Drug Use?: No ?Have You Ever Had a Drink or Used Drugs First Thing In The Morning to Steady Your Nerves or to Get Rid of a Hangover?: No ?CAGE-AID Score: 0 ? ?Substance Abuse Education Offered: No ? ?  ? ? ? ? ? ? ?

## 2022-02-15 ENCOUNTER — Encounter (HOSPITAL_COMMUNITY): Payer: Self-pay | Admitting: Orthopedic Surgery

## 2022-02-15 DIAGNOSIS — I482 Chronic atrial fibrillation, unspecified: Secondary | ICD-10-CM | POA: Diagnosis not present

## 2022-02-15 DIAGNOSIS — M79605 Pain in left leg: Secondary | ICD-10-CM | POA: Diagnosis not present

## 2022-02-15 DIAGNOSIS — Z1152 Encounter for screening for COVID-19: Secondary | ICD-10-CM | POA: Diagnosis not present

## 2022-02-15 DIAGNOSIS — S728X2D Other fracture of left femur, subsequent encounter for closed fracture with routine healing: Secondary | ICD-10-CM | POA: Diagnosis not present

## 2022-02-15 DIAGNOSIS — E785 Hyperlipidemia, unspecified: Secondary | ICD-10-CM | POA: Diagnosis not present

## 2022-02-15 DIAGNOSIS — F5101 Primary insomnia: Secondary | ICD-10-CM | POA: Diagnosis not present

## 2022-02-15 DIAGNOSIS — S72402A Unspecified fracture of lower end of left femur, initial encounter for closed fracture: Secondary | ICD-10-CM | POA: Diagnosis not present

## 2022-02-15 DIAGNOSIS — N1832 Chronic kidney disease, stage 3b: Secondary | ICD-10-CM | POA: Diagnosis not present

## 2022-02-15 DIAGNOSIS — S72462A Displaced supracondylar fracture with intracondylar extension of lower end of left femur, initial encounter for closed fracture: Secondary | ICD-10-CM | POA: Diagnosis not present

## 2022-02-15 DIAGNOSIS — D0510 Intraductal carcinoma in situ of unspecified breast: Secondary | ICD-10-CM | POA: Diagnosis not present

## 2022-02-15 DIAGNOSIS — Z96641 Presence of right artificial hip joint: Secondary | ICD-10-CM | POA: Diagnosis not present

## 2022-02-15 DIAGNOSIS — E1122 Type 2 diabetes mellitus with diabetic chronic kidney disease: Secondary | ICD-10-CM | POA: Diagnosis not present

## 2022-02-15 DIAGNOSIS — Z7401 Bed confinement status: Secondary | ICD-10-CM | POA: Diagnosis not present

## 2022-02-15 DIAGNOSIS — Z9049 Acquired absence of other specified parts of digestive tract: Secondary | ICD-10-CM | POA: Diagnosis not present

## 2022-02-15 DIAGNOSIS — R531 Weakness: Secondary | ICD-10-CM | POA: Diagnosis not present

## 2022-02-15 DIAGNOSIS — I10 Essential (primary) hypertension: Secondary | ICD-10-CM | POA: Diagnosis not present

## 2022-02-15 DIAGNOSIS — R197 Diarrhea, unspecified: Secondary | ICD-10-CM | POA: Diagnosis not present

## 2022-02-15 DIAGNOSIS — L299 Pruritus, unspecified: Secondary | ICD-10-CM | POA: Diagnosis not present

## 2022-02-15 DIAGNOSIS — M1990 Unspecified osteoarthritis, unspecified site: Secondary | ICD-10-CM | POA: Diagnosis not present

## 2022-02-15 DIAGNOSIS — M810 Age-related osteoporosis without current pathological fracture: Secondary | ICD-10-CM | POA: Diagnosis not present

## 2022-02-15 DIAGNOSIS — G4733 Obstructive sleep apnea (adult) (pediatric): Secondary | ICD-10-CM | POA: Diagnosis not present

## 2022-02-15 DIAGNOSIS — H04123 Dry eye syndrome of bilateral lacrimal glands: Secondary | ICD-10-CM | POA: Diagnosis not present

## 2022-02-15 DIAGNOSIS — J984 Other disorders of lung: Secondary | ICD-10-CM | POA: Diagnosis not present

## 2022-02-15 DIAGNOSIS — S72462D Displaced supracondylar fracture with intracondylar extension of lower end of left femur, subsequent encounter for closed fracture with routine healing: Secondary | ICD-10-CM | POA: Diagnosis not present

## 2022-02-15 DIAGNOSIS — D62 Acute posthemorrhagic anemia: Secondary | ICD-10-CM

## 2022-02-15 DIAGNOSIS — J189 Pneumonia, unspecified organism: Secondary | ICD-10-CM | POA: Diagnosis not present

## 2022-02-15 DIAGNOSIS — Z7689 Persons encountering health services in other specified circumstances: Secondary | ICD-10-CM | POA: Diagnosis not present

## 2022-02-15 DIAGNOSIS — N189 Chronic kidney disease, unspecified: Secondary | ICD-10-CM | POA: Diagnosis not present

## 2022-02-15 DIAGNOSIS — M62838 Other muscle spasm: Secondary | ICD-10-CM | POA: Diagnosis not present

## 2022-02-15 DIAGNOSIS — E1142 Type 2 diabetes mellitus with diabetic polyneuropathy: Secondary | ICD-10-CM | POA: Diagnosis not present

## 2022-02-15 DIAGNOSIS — S72402D Unspecified fracture of lower end of left femur, subsequent encounter for closed fracture with routine healing: Secondary | ICD-10-CM | POA: Diagnosis not present

## 2022-02-15 DIAGNOSIS — I4892 Unspecified atrial flutter: Secondary | ICD-10-CM | POA: Diagnosis not present

## 2022-02-15 DIAGNOSIS — F339 Major depressive disorder, recurrent, unspecified: Secondary | ICD-10-CM | POA: Diagnosis not present

## 2022-02-15 DIAGNOSIS — F418 Other specified anxiety disorders: Secondary | ICD-10-CM | POA: Diagnosis not present

## 2022-02-15 DIAGNOSIS — R093 Abnormal sputum: Secondary | ICD-10-CM | POA: Diagnosis not present

## 2022-02-15 DIAGNOSIS — F331 Major depressive disorder, recurrent, moderate: Secondary | ICD-10-CM | POA: Diagnosis not present

## 2022-02-15 DIAGNOSIS — G2581 Restless legs syndrome: Secondary | ICD-10-CM | POA: Diagnosis not present

## 2022-02-15 DIAGNOSIS — R051 Acute cough: Secondary | ICD-10-CM | POA: Diagnosis not present

## 2022-02-15 DIAGNOSIS — R062 Wheezing: Secondary | ICD-10-CM | POA: Diagnosis not present

## 2022-02-15 DIAGNOSIS — D649 Anemia, unspecified: Secondary | ICD-10-CM | POA: Diagnosis not present

## 2022-02-15 DIAGNOSIS — M25562 Pain in left knee: Secondary | ICD-10-CM | POA: Diagnosis not present

## 2022-02-15 DIAGNOSIS — S72041S Displaced fracture of base of neck of right femur, sequela: Secondary | ICD-10-CM | POA: Diagnosis not present

## 2022-02-15 DIAGNOSIS — F32A Depression, unspecified: Secondary | ICD-10-CM | POA: Diagnosis not present

## 2022-02-15 DIAGNOSIS — K5900 Constipation, unspecified: Secondary | ICD-10-CM | POA: Diagnosis not present

## 2022-02-15 DIAGNOSIS — G4709 Other insomnia: Secondary | ICD-10-CM | POA: Diagnosis not present

## 2022-02-15 LAB — CBC WITH DIFFERENTIAL/PLATELET
Abs Immature Granulocytes: 0.03 10*3/uL (ref 0.00–0.07)
Basophils Absolute: 0 10*3/uL (ref 0.0–0.1)
Basophils Relative: 0 %
Eosinophils Absolute: 0.1 10*3/uL (ref 0.0–0.5)
Eosinophils Relative: 1 %
HCT: 31.6 % — ABNORMAL LOW (ref 36.0–46.0)
Hemoglobin: 10 g/dL — ABNORMAL LOW (ref 12.0–15.0)
Immature Granulocytes: 0 %
Lymphocytes Relative: 26 %
Lymphs Abs: 1.9 10*3/uL (ref 0.7–4.0)
MCH: 30.3 pg (ref 26.0–34.0)
MCHC: 31.6 g/dL (ref 30.0–36.0)
MCV: 95.8 fL (ref 80.0–100.0)
Monocytes Absolute: 0.8 10*3/uL (ref 0.1–1.0)
Monocytes Relative: 11 %
Neutro Abs: 4.6 10*3/uL (ref 1.7–7.7)
Neutrophils Relative %: 62 %
Platelets: 142 10*3/uL — ABNORMAL LOW (ref 150–400)
RBC: 3.3 MIL/uL — ABNORMAL LOW (ref 3.87–5.11)
RDW: 12.8 % (ref 11.5–15.5)
WBC: 7.4 10*3/uL (ref 4.0–10.5)
nRBC: 0 % (ref 0.0–0.2)

## 2022-02-15 LAB — BASIC METABOLIC PANEL
Anion gap: 6 (ref 5–15)
BUN: 28 mg/dL — ABNORMAL HIGH (ref 8–23)
CO2: 25 mmol/L (ref 22–32)
Calcium: 8.4 mg/dL — ABNORMAL LOW (ref 8.9–10.3)
Chloride: 106 mmol/L (ref 98–111)
Creatinine, Ser: 1.66 mg/dL — ABNORMAL HIGH (ref 0.44–1.00)
GFR, Estimated: 32 mL/min — ABNORMAL LOW (ref >60)
Glucose, Bld: 149 mg/dL — ABNORMAL HIGH (ref 70–99)
Potassium: 5 mmol/L (ref 3.5–5.1)
Sodium: 137 mmol/L (ref 135–145)

## 2022-02-15 LAB — GLUCOSE, CAPILLARY
Glucose-Capillary: 123 mg/dL — ABNORMAL HIGH (ref 70–99)
Glucose-Capillary: 193 mg/dL — ABNORMAL HIGH (ref 70–99)
Glucose-Capillary: 101 mg/dL — ABNORMAL HIGH (ref 70–99)
Glucose-Capillary: 191 mg/dL — ABNORMAL HIGH (ref 70–99)

## 2022-02-15 LAB — CALCIUM, IONIZED: Calcium, Ionized, Serum: 4.6 mg/dL (ref 4.5–5.6)

## 2022-02-15 MED ORDER — POLYETHYLENE GLYCOL 3350 17 G PO PACK: 17.0000 g | PACK | Freq: Every day | ORAL | 0 refills | Status: DC

## 2022-02-15 MED ORDER — DOCUSATE SODIUM 100 MG PO CAPS
100.0000 mg | ORAL_CAPSULE | Freq: Two times a day (BID) | ORAL | 0 refills | Status: DC
Start: 2022-02-15 — End: 2023-11-01

## 2022-02-15 MED ORDER — TRAMADOL HCL 50 MG PO TABS: 50.0000 mg | ORAL_TABLET | Freq: Four times a day (QID) | ORAL | 0 refills | Status: DC | PRN

## 2022-02-15 MED ORDER — ZOLPIDEM TARTRATE 5 MG PO TABS: 5.0000 mg | ORAL_TABLET | Freq: Every day | ORAL | 0 refills | Status: DC

## 2022-02-15 MED ORDER — SENNA 8.6 MG PO TABS
1.0000 | ORAL_TABLET | Freq: Two times a day (BID) | ORAL | 0 refills | Status: DC
Start: 2022-02-15 — End: 2023-11-01

## 2022-02-15 NOTE — [place] FL2 (Signed)
? MEDICAID FL2 LEVEL OF CARE SCREENING TOOL  ?  ? ?IDENTIFICATION  ?Patient Name: ?Heather [name] Birthdate: June 16, 1946 Sex: female Admission Date (Current Location): ?02/12/2022  ?[place] and [place] Number: ? [place] ?  Facility and Address:  ?[facility] [place] [address], [place], [place] [address] ?     Provider Number: ?[id number]  ?Attending Physician Name and Address:  ?Shelly Coss, MD ? Relative Name and Phone Number:  ?Ebony Cargo Other   [phone] ?   ?Current Level of Care: ?Hospital Recommended Level of Care: ?[place] Prior Approval Number: ?  ? ?Date Approved/Denied: ?  PASRR Number: ?[id number] A ? ?Discharge Plan: ?SNF ?  ? ?Current Diagnoses: ?Patient Active Problem List  ? Diagnosis Date Noted  ? Acute postoperative anemia due to expected blood loss 02/15/2022  ? Closed fracture of left distal femur ([place]) 02/12/2022  ? CKD (chronic kidney disease) stage 3, GFR 30-59 ml/min (HCC) 02/12/2022  ? Depression 02/12/2022  ? S/P right total hip arthroplasty 05/09/2021  ? Type 2 diabetes mellitus with diabetic chronic kidney disease ([place]) 05/08/2021  ? Diabetic peripheral angiopathy ([place]) 05/07/2021  ? Hip fracture ([place]) 05/07/2021  ? History of ductal carcinoma in situ (DCIS) of breast 09/11/2016  ? Fasciculation 04/17/2016  ? Obstructive apnea 04/17/2016  ? Restless leg 04/17/2016  ? Long term current use of anticoagulant 05/30/2015  ? Other long term (current) drug therapy 05/30/2015  ? Atrial fibrillation ([place]) 08/14/2014  ? Type II or unspecified type diabetes mellitus with peripheral circulatory disorders, uncontrolled(250.72) 05/15/2013  ? Diabetic peripheral neuropathy ([place]) 05/15/2013  ? Morbid obesity ([place]) 05/15/2013  ? Diabetes mellitus ([place]) 05/15/2013  ? Foot pain 05/15/2013  ? Osteoarthritis   ? Proliferative diabetic retinopathy of both eyes ([place])   ? Bell's palsy   ? ? ?Orientation RESPIRATION BLADDER Height & Weight   ?  ?Self, Time,  Situation, Place ? Normal Continent, External catheter Weight: 196 lb (88.9 kg) ?Height:  '5\' 7"'$  (170.2 cm)  ?BEHAVIORAL SYMPTOMS/MOOD NEUROLOGICAL BOWEL NUTRITION STATUS  ?    Continent Diet (see discharge summary)  ?AMBULATORY STATUS COMMUNICATION OF NEEDS Skin   ?Total Care Verbally Surgical wounds ?  ?  ?  ?    ?     ?     ? ? ?Personal Care Assistance Level of Assistance  ?Bathing, Feeding, Dressing Bathing Assistance: Maximum assistance ?Feeding assistance: Limited assistance ?Dressing Assistance: Maximum assistance ?   ? ?Functional Limitations Info  ?Sight, Hearing, Speech Sight Info: Adequate ?Hearing Info: Adequate ?Speech Info: Adequate  ? ? ?SPECIAL CARE FACTORS FREQUENCY  ?PT (By licensed PT), OT (By licensed OT)   ?  ?PT Frequency: 5x week ?OT Frequency: 5x week ?  ?  ?  ?   ? ? ?Contractures Contractures Info: Not present  ? ? ?Additional Factors Info  ?Code Status, Allergies Code Status Info: full ?Allergies Info: Sulfa Antibiotics, Sulfasalazine ?  ?  ?  ?   ? ?Current Medications (02/15/2022):  This is the current hospital active medication list ?Current Facility-Administered Medications  ?Medication Dose Route Frequency Provider Last Rate Last Admin  ? 0.9 %  sodium chloride infusion  250 mL Intravenous PRN Ainsley Spinner, PA-C      ? acetaminophen (TYLENOL) tablet 650 mg  650 mg Oral Q12H Ainsley Spinner, PA-C   650 mg at 02/15/22 8185  ? apixaban (ELIQUIS) tablet 5 mg  5 mg Oral BID Wendee Beavers, RPH   5  mg at 02/15/22 0842  ? ascorbic acid (VITAMIN C) tablet 500 mg  500 mg Oral Daily Ainsley Spinner, PA-C   500 mg at 02/15/22 4970  ? cholecalciferol (VITAMIN D3) tablet 2,000 Units  2,000 Units Oral BID Ainsley Spinner, PA-C   2,000 Units at 02/15/22 2637  ? diphenhydrAMINE (BENADRYL) capsule 25 mg  25 mg Oral Q8H PRN Alma Friendly, MD   25 mg at 02/14/22 2120  ? docusate sodium (COLACE) capsule 100 mg  100 mg Oral BID Ainsley Spinner, PA-C   100 mg at 02/15/22 8588  ? DULoxetine (CYMBALTA) DR capsule 60 mg   60 mg Oral q morning Ainsley Spinner, PA-C   60 mg at 02/15/22 5027  ? HYDROcodone-acetaminophen (NORCO/VICODIN) 5-325 MG per tablet 1-2 tablet  1-2 tablet Oral Q6H PRN Ainsley Spinner, PA-C   2 tablet at 02/15/22 7412  ? insulin aspart (novoLOG) injection 0-15 Units  0-15 Units Subcutaneous TID WC Shelly Coss, MD   2 Units at 02/15/22 0845  ? insulin aspart (novoLOG) injection 3 Units  3 Units Subcutaneous TID WC Shelly Coss, MD   3 Units at 02/15/22 0845  ? insulin glargine-yfgn (SEMGLEE) injection 30 Units  30 Units Subcutaneous QHS Shelly Coss, MD   30 Units at 02/14/22 2121  ? methocarbamol (ROBAXIN) 500 mg in dextrose 5 % 50 mL IVPB  500 mg Intravenous Q6H PRN Ainsley Spinner, PA-C      ? metoCLOPramide (REGLAN) tablet 5-10 mg  5-10 mg Oral Q8H PRN Ainsley Spinner, PA-C      ? Or  ? metoCLOPramide (REGLAN) injection 5-10 mg  5-10 mg Intravenous Q8H PRN Ainsley Spinner, PA-C      ? metoprolol succinate (TOPROL-XL) 24 hr tablet 25 mg  25 mg Oral BID AC & HS Ainsley Spinner, PA-C   25 mg at 02/15/22 8786  ? miconazole nitrate (MICATIN) topical powder   Topical TID Ainsley Spinner, PA-C   Given at 02/13/22 2306  ? morphine (PF) 2 MG/ML injection 2-3 mg  2-3 mg Intravenous Q4H PRN Ainsley Spinner, PA-C   2 mg at 02/13/22 1640  ? mupirocin ointment (BACTROBAN) 2 % 1 application.  1 application. Nasal BID Ainsley Spinner, PA-C   1 application. at 02/15/22 0846  ? naloxone [facility]) injection 0.4 mg  0.4 mg Intravenous PRN Ainsley Spinner, PA-C      ? ondansetron [facility]) tablet 4 mg  4 mg Oral Q6H PRN Ainsley Spinner, PA-C      ? Or  ? ondansetron (ZOFRAN) injection 4 mg  4 mg Intravenous Q6H PRN Ainsley Spinner, PA-C      ? polyethylene glycol (MIRALAX / GLYCOLAX) packet 17 g  17 g Oral Daily Shelly Coss, MD   17 g at 02/15/22 0844  ? polyvinyl alcohol (LIQUIFILM TEARS) 1.4 % ophthalmic solution 1 drop  1 drop Both Eyes PRN Ainsley Spinner, PA-C      ? rOPINIRole (REQUIP XL) 24 hr tablet 4 mg  4 mg Oral QHS Ainsley Spinner, PA-C   4 mg at 02/14/22 2121  ?  rosuvastatin (CRESTOR) tablet 40 mg  40 mg Oral QHS Ainsley Spinner, PA-C   40 mg at 02/14/22 2119  ? senna (SENOKOT) tablet 8.6 mg  1 tablet Oral BID Shelly Coss, MD   8.6 mg at 02/15/22 7672  ? senna-docusate (Senokot-S) tablet 1 tablet  1 tablet Oral QHS PRN Ainsley Spinner, PA-C      ? sodium chloride flush (NS) 0.9 % injection 3 mL  3 mL Intravenous Q12H Ainsley Spinner, PA-C   3 mL at 02/15/22 4081  ? sodium chloride flush (NS) 0.9 % injection 3 mL  3 mL Intravenous PRN Ainsley Spinner, PA-C      ? zinc sulfate capsule 220 mg  220 mg Oral Daily Ainsley Spinner, PA-C   220 mg at 02/15/22 4481  ? zolpidem (AMBIEN) tablet 5 mg  5 mg Oral QHS PRN Ainsley Spinner, PA-C   5 mg at 02/14/22 2120  ? ? ? ?Discharge Medications: ?Please see discharge summary for a list of discharge medications. ? ?Relevant Imaging Results: ? ?Relevant Lab Results: ? ? ?Additional Information ?SSN# [id number]. Pt is vaccinated for covid with one booster. ? ?Joanne Chars, LCSW ? ? ? ? ?

## 2022-02-15 NOTE — Progress Notes (Signed)
This Probation officer has been informed [facility] left without my knowledge. Pt was eating her dinner and requested for a few minutes from [facility]. [facility] called and asked if she's still on the list for transport.Per PTAR they will pick her up in the next 55mns. Night shift nurse made aware of above. ?

## 2022-02-15 NOTE — Progress Notes (Incomplete)
[facility] has arrived to transport patient to [facility].  Patient received  2 Norco PO for pain.  She is alert and oriented x 4. ?

## 2022-02-15 NOTE — Discharge Summary (Signed)
Physician Discharge Summary  Heather [name] IEP:[id number] DOB: 28-Jun-1946 DOA: 02/12/2022  PCP: Greig Right, MD  Admit date: 02/12/2022 Discharge date: 02/15/2022  Admitted From: Home Disposition:  SNF  Discharge Condition:Stable CODE STATUS:FULL Diet recommendation: Carb Modified   Brief/Interim Summary: Heather [name] is a 76 y.o. female with medical history significant of Afib, hx of breast Ca, CKD stage 3, T2DM, GERD, OSA, RLS, depression who presented to ED after she slipped in her bathroom and landed on her knees, had immediate pain in both legs and could not get up. Pt ambulates independently.  She was found to have left distal femoral periprosthetic fracture.  Orthopedics consulted and underwent ORIF on 02/13/2022.  PT/OT evaluation done, this has been recommended skilled nursing facility on discharge.  Medically stable for discharge today.  Following problems were addressed during hospitalization:  Closed fracture of left distal femur ([place]) 76 year old female with fall and severely displaced left distal femoral periprosthetic fracture -Status post ORIF on 02/13/2022 -Continue pain management, supportive care, bowel regimen -SNF on DC   CKD (chronic kidney disease) stage 3, GFR 30-59 ml/min (HCC) Creatinine 1.4-1.7 baseline Stable.Check Bmp in a week   Atrial fibrillation ([place]) Rate controlled.  On anticoagulation with Eliquis Continue metoprolol   Diabetes mellitus ([place]) Hba1c 6.9 in 04/2021 Takes insulin at [facility] on  farxiga.  Restless leg Continue requip    Depression Continue cymbalta    History of ductal carcinoma in situ (DCIS) of breast Likely in remission.  We recommend to follow-up with the oncologist as an outpatient if needed   Obstructive apnea Does not wear cpap as prescribed and declines one here   Obesity: BMI of 30.7      Discharge Diagnoses:  Principal Problem:   Closed fracture of left distal femur ([place]) Active Problems:   CKD (chronic  kidney disease) stage 3, GFR 30-59 ml/min (HCC)   Atrial fibrillation (HCC)   Diabetes mellitus ([place])   Restless leg   Obstructive apnea   History of ductal carcinoma in situ (DCIS) of breast   Depression   Acute postoperative anemia due to expected blood loss    Discharge Instructions  Discharge Instructions     Diet - low sodium heart healthy   Complete by: As directed    Diet Carb Modified   Complete by: As directed    Discharge instructions   Complete by: As directed    1)Please take prescribed medications as instructed 2)Follow up with orthopedics in 2 weeks.  Name and number the provider has been attached 3)Do a CBC and BMP test in a week   Increase activity slowly   Complete by: As directed    No wound care   Complete by: As directed       Allergies as of 02/15/2022       Reactions   Sulfa Antibiotics Itching   Sulfasalazine Itching        Medication List     TAKE these medications    Cholecalciferol 50 MCG (2000 UT) Tabs Take 2,000 Units by mouth every morning.   dapagliflozin propanediol 10 MG Tabs tablet Commonly known as: FARXIGA Take 10 mg by mouth every morning.   docusate sodium 100 MG capsule Commonly known as: COLACE Take 1 capsule (100 mg total) by mouth 2 (two) times daily.   DULoxetine 60 MG capsule Commonly known as: CYMBALTA Take 60 mg by mouth every morning.   Eliquis 5 MG Tabs tablet Generic drug: apixaban Take 5 mg  by mouth 2 (two) times daily.   metoprolol succinate 50 MG 24 hr tablet Commonly known as: TOPROL-XL Take 25 mg by mouth 2 (two) times daily at 8 am and 10 pm.   multivitamin with minerals Tabs tablet Take 1 tablet by mouth every morning.   PATADAY OP Place 1 drop into both eyes daily as needed (allergies/itching).   polyethylene glycol 17 g packet Commonly known as: MIRALAX / GLYCOLAX Take 17 g by mouth daily. Start taking on: February 16, 2022 What changed: when to take this   Ropinirole HCl 6 MG  Tb24 Take 6 mg by mouth every morning.   rosuvastatin 40 MG tablet Commonly known as: CRESTOR Take 40 mg by mouth at bedtime.   senna 8.6 MG Tabs tablet Commonly known as: SENOKOT Take 1 tablet (8.6 mg total) by mouth 2 (two) times daily.   Soliqua 100-33 UNT-MCG/ML Sopn Generic drug: Insulin Glargine-Lixisenatide Inject 40 Units into the skin at bedtime.   Systane 0.4-0.3 % Soln Generic drug: Polyethyl Glycol-Propyl Glycol Place 1 drop into both eyes daily as needed (dry eyes).   tiZANidine 4 MG tablet Commonly known as: ZANAFLEX Take 4 mg by mouth every 6 (six) hours as needed for muscle spasms.   traMADol 50 MG tablet Commonly known as: ULTRAM Take 1 tablet (50 mg total) by mouth every 6 (six) hours as needed for moderate pain.   zolpidem 5 MG tablet Commonly known as: AMBIEN Take 1 tablet (5 mg total) by mouth at bedtime. What changed: medication strength        Follow-up Information     Altamese Willards, MD. Schedule an appointment as soon as possible for a visit in 2 week(s).   Specialty: Orthopedic Surgery Contact information: [place] [address] [phone]                Allergies  Allergen Reactions   Sulfa Antibiotics Itching   Sulfasalazine Itching    Consultations: Orthopedics   Procedures/Studies: DG Chest 2 View  Result Date: 02/12/2022 CLINICAL DATA:  Fall. EXAM: CHEST - 2 VIEW COMPARISON:  May 07, 2021. FINDINGS: Stable cardiomediastinal silhouette. Both lungs are clear. The visualized skeletal structures are unremarkable. IMPRESSION: No active cardiopulmonary disease. Aortic Atherosclerosis (ICD10-I70.0). Electronically Signed   By: Marijo Conception M.D.   On: 02/12/2022 13:27   CT KNEE LEFT WO CONTRAST  Result Date: 02/12/2022 CLINICAL DATA:  Periprosthetic left knee fracture. Preoperative planning EXAM: CT OF THE LEFT KNEE WITHOUT CONTRAST TECHNIQUE: Multidetector CT imaging of the left knee was performed  according to the standard protocol. Multiplanar CT image reconstructions were also generated. RADIATION DOSE REDUCTION: This exam was performed according to the departmental dose-optimization program which includes automated exposure control, adjustment of the mA and/or kV according to patient size and/or use of iterative reconstruction technique. COMPARISON:  X-ray 02/12/2022 FINDINGS: Bones/Joint/Cartilage Postsurgical changes from left total knee arthroplasty. Redemonstration of acute periprosthetic fracture of the distal left femur. Fracture is predominantly obliquely oriented extending from the lateral femoral condyle to the posteromedial aspect of the distal femoral diaphysis approximately 10 cm above the joint line. Fracture is slightly impacted. Approximately 1.3 cm of posterior displacement. The patella, proximal tibia, and proximal fibula are intact without evidence of fracture. Small knee joint effusion. Ligaments Suboptimally assessed by CT. Muscles and Tendons No definite acute musculotendinous abnormality. Extensor mechanism is largely obscured by artifact. Soft tissues Small posttraumatic hematoma at the fracture site. Atherosclerotic vascular calcifications are present. IMPRESSION: Acute  periprosthetic fracture of the distal left femur, as described above. Electronically Signed   By: Davina Poke D.O.   On: 02/12/2022 14:41   DG Knee Complete 4 Views Left  Result Date: 02/12/2022 CLINICAL DATA:  Left knee pain. EXAM: LEFT KNEE - COMPLETE 4+ VIEW COMPARISON:  None. FINDINGS: Status post left total knee arthroplasty. Severely displaced periprosthetic fracture is seen involving the distal left femur. IMPRESSION: Severely displaced left distal femoral periprosthetic fracture. Electronically Signed   By: Marijo Conception M.D.   On: 02/12/2022 13:23   DG Knee Complete 4 Views Right  Result Date: 02/12/2022 CLINICAL DATA:  Right knee pain after fall. EXAM: RIGHT KNEE - COMPLETE 4+ VIEW COMPARISON:   None. FINDINGS: Status post right total knee arthroplasty. The femoral and tibial components are well situated. No fracture, dislocation or effusion is noted. IMPRESSION: No acute abnormality seen. Electronically Signed   By: Marijo Conception M.D.   On: 02/12/2022 13:26   DG Knee Left Port  Result Date: 02/13/2022 CLINICAL DATA:  Left distal femur ORIF. EXAM: PORTABLE LEFT KNEE - 1-2 VIEW COMPARISON:  CT left knee dated February 12, 2022. FINDINGS: Interval lateral plate and screw fixation of the oblique distal femoral metadiaphyseal fracture, now in anatomic alignment. Lag screw noted. Left total knee arthroplasty. Incompletely visualized left hip arthroplasty femoral stem. Expected postsurgical subcutaneous emphysema in the distal thigh. IMPRESSION: 1. Interval lateral plate and screw fixation of the distal femoral metadiaphyseal fracture, now in anatomic alignment. Electronically Signed   By: Titus Dubin M.D.   On: 02/13/2022 15:23   DG C-Arm 1-60 Min-No Report  Result Date: 02/13/2022 Fluoroscopy was utilized by the requesting physician.  No radiographic interpretation.   DG C-Arm 1-60 Min-No Report  Result Date: 02/13/2022 Fluoroscopy was utilized by the requesting physician.  No radiographic interpretation.   DG FEMUR MIN 2 VIEWS LEFT  Result Date: 02/13/2022 CLINICAL DATA:  ORIF left distal femur EXAM: LEFT FEMUR 2 VIEWS COMPARISON:  02/12/2022 FINDINGS: Multiple C-arm images show lateral plate and screw fixation of a distal femur fracture. Previous total knee replacement. There is restoration of anatomic alignment. IMPRESSION: ORIF of distal femur fracture with restoration of anatomic position and alignment. Electronically Signed   By: Nelson Chimes M.D.   On: 02/13/2022 13:24      Subjective:  Patient seen and examined at the bedside this morning.  Hemodynamically stable for discharge today.   Discharge Exam: Vitals:   02/15/22 0448 02/15/22 0751  BP: (!) 114/55 (!) 110/50  Pulse: 83  (!) 107  Resp: 18 18  Temp: 97.9 F (36.6 C)   SpO2: 99% 97%   Vitals:   02/14/22 1608 02/14/22 1942 02/15/22 0448 02/15/22 0751  BP: 110/72 (!) 112/58 (!) 114/55 (!) 110/50  Pulse: 81 72 83 (!) 107  Resp: '18 19 18 18  '$ Temp: 98 F (36.7 C) 98.5 F (36.9 C) 97.9 F (36.6 C)   TempSrc: Oral Oral    SpO2: 99% 95% 99% 97%  Weight:      Height:        General: Pt is alert, awake, not in acute distress Cardiovascular: RRR, S1/S2 +, no rubs, no gallops Respiratory: CTA bilaterally, no wheezing, no rhonchi Abdominal: Soft, NT, ND, bowel sounds + Extremities: no edema, no cyanosis    The results of significant diagnostics from this hospitalization (including imaging, microbiology, ancillary and laboratory) are listed below for reference.     Microbiology: Recent Results (from the past 240  hour(s))  Resp Panel by RT-PCR (Flu A&B, Covid) Nasopharyngeal Swab     Status: None   Collection Time: 02/12/22  2:42 PM   Specimen: Nasopharyngeal Swab; Nasopharyngeal(NP) swabs in vial transport medium  Result Value Ref Range Status   SARS Coronavirus 2 by RT PCR NEGATIVE NEGATIVE Final    Comment: (NOTE) SARS-CoV-2 target nucleic acids are NOT DETECTED.  The SARS-CoV-2 RNA is generally detectable in upper respiratory specimens during the acute phase of infection. The lowest concentration of SARS-CoV-2 viral copies this assay can detect is 138 copies/mL. A negative result does not preclude SARS-Cov-2 infection and should not be used as the sole basis for treatment or other patient management decisions. A negative result may occur with  improper specimen collection/handling, submission of specimen other than nasopharyngeal swab, presence of viral mutation(s) within the areas targeted by this assay, and inadequate number of viral copies(<138 copies/mL). A negative result must be combined with clinical observations, patient history, and epidemiological information. The expected result is  Negative.  Fact Sheet for Patients:  EntrepreneurPulse.com.au  Fact Sheet for Healthcare Providers:  IncredibleEmployment.be  This test is no t yet approved or cleared by the [place] FDA and  has been authorized for detection and/or diagnosis of SARS-CoV-2 by FDA under an Emergency Use Authorization (EUA). This EUA will remain  in effect (meaning this test can be used) for the duration of the COVID-19 declaration under Section 564(b)(1) of the Act, 21 U.S.C.section 360bbb-3(b)(1), unless the authorization is terminated  or revoked sooner.       Influenza A by PCR NEGATIVE NEGATIVE Final   Influenza B by PCR NEGATIVE NEGATIVE Final    Comment: (NOTE) The Xpert Xpress SARS-CoV-2/FLU/RSV plus assay is intended as an aid in the diagnosis of influenza from Nasopharyngeal swab specimens and should not be used as a sole basis for treatment. Nasal washings and aspirates are unacceptable for Xpert Xpress SARS-CoV-2/FLU/RSV testing.  Fact Sheet for Patients: EntrepreneurPulse.com.au  Fact Sheet for Healthcare Providers: IncredibleEmployment.be  This test is not yet approved or cleared by the [place] FDA and has been authorized for detection and/or diagnosis of SARS-CoV-2 by FDA under an Emergency Use Authorization (EUA). This EUA will remain in effect (meaning this test can be used) for the duration of the COVID-19 declaration under Section 564(b)(1) of the Act, 21 U.S.C. section 360bbb-3(b)(1), unless the authorization is terminated or revoked.  Performed at [facility], [place] [address] [place], [place] [address]   Surgical pcr screen     Status: Abnormal   Collection Time: 02/13/22  7:29 AM   Specimen: Nasal Mucosa; Nasal Swab  Result Value Ref Range Status   MRSA, PCR NEGATIVE NEGATIVE Final   Staphylococcus aureus POSITIVE (A) NEGATIVE Final    Comment: (NOTE) The Xpert SA Assay (FDA  approved for NASAL specimens in patients 39 years of age and older), is one component of a comprehensive surveillance program. It is not intended to diagnose infection nor to guide or monitor treatment. Performed at [facility], [place] [address], [place], [place] [address]      Labs: BNP (last 3 results) No results for input(s): BNP in the last 8760 hours. Basic Metabolic Panel: Recent Labs  Lab 02/12/22 1850 02/13/22 0235 02/14/22 0450 02/15/22 0121  NA 136 140 134* 137  K 4.4 4.2 5.1 5.0  CL 103 105 102 106  CO2 19* 23 19* 25  GLUCOSE 146* 138* QUANTITY NOT SUFFICIENT, UNABLE TO PERFORM TEST  149*  BUN 19 18 25* 28*  CREATININE 1.61* 1.62* QUANTITY NOT SUFFICIENT, UNABLE TO PERFORM TEST 1.66*  CALCIUM 9.0 9.4 8.2* 8.4*  MG  --   --  2.0  --   PHOS  --   --  QUANTITY NOT SUFFICIENT, UNABLE TO PERFORM TEST  --    Liver Function Tests: Recent Labs  Lab 02/12/22 1850 02/14/22 0450  AST 46* 25  ALT 33 19  ALKPHOS 44 36*  BILITOT 1.2 0.3  PROT 6.4* QUANTITY NOT SUFFICIENT, UNABLE TO PERFORM TEST  ALBUMIN 3.5 QUANTITY NOT SUFFICIENT, UNABLE TO PERFORM TEST   No results for input(s): LIPASE, AMYLASE in the last 168 hours. No results for input(s): AMMONIA in the last 168 hours. CBC: Recent Labs  Lab 02/12/22 1850 02/13/22 0235 02/14/22 0646 02/15/22 0121  WBC 7.7 7.1 8.5 7.4  NEUTROABS 4.8  --  6.2 4.6  HGB 14.8 15.6* 11.9* 10.0*  HCT 45.9 47.5* 36.9 31.6*  MCV 95.8 94.2 94.4 95.8  PLT 164 173 157 142*   Cardiac Enzymes: No results for input(s): CKTOTAL, CKMB, CKMBINDEX, TROPONINI in the last 168 hours. BNP: Invalid input(s): POCBNP CBG: Recent Labs  Lab 02/14/22 0759 02/14/22 1148 02/14/22 1624 02/14/22 1940 02/15/22 0730  GLUCAP 316* 248* 343* 222* 123*   D-Dimer No results for input(s): DDIMER in the last 72 hours. Hgb A1c No results for input(s): HGBA1C in the last 72 hours. Lipid Profile No results for input(s): CHOL, HDL, LDLCALC, TRIG,  CHOLHDL, LDLDIRECT in the last 72 hours. Thyroid function studies No results for input(s): TSH, T4TOTAL, T3FREE, THYROIDAB in the last 72 hours.  Invalid input(s): FREET3 Anemia work up No results for input(s): VITAMINB12, FOLATE, FERRITIN, TIBC, IRON, RETICCTPCT in the last 72 hours. Urinalysis    Component Value Date/Time   COLORURINE YELLOW 08/14/2014 1336   APPEARANCEUR CLEAR 08/14/2014 1336   LABSPEC 1.015 08/14/2014 1336   PHURINE 5.0 08/14/2014 1336   GLUCOSEU >1000 (A) 08/14/2014 1336   HGBUR NEGATIVE 08/14/2014 1336   BILIRUBINUR NEGATIVE 08/14/2014 1336   KETONESUR NEGATIVE 08/14/2014 1336   PROTEINUR NEGATIVE 08/14/2014 1336   UROBILINOGEN 0.2 08/14/2014 1336   NITRITE NEGATIVE 08/14/2014 1336   LEUKOCYTESUR NEGATIVE 08/14/2014 1336   Sepsis Labs Invalid input(s): PROCALCITONIN,  WBC,  LACTICIDVEN Microbiology Recent Results (from the past 240 hour(s))  Resp Panel by RT-PCR (Flu A&B, Covid) Nasopharyngeal Swab     Status: None   Collection Time: 02/12/22  2:42 PM   Specimen: Nasopharyngeal Swab; Nasopharyngeal(NP) swabs in vial transport medium  Result Value Ref Range Status   SARS Coronavirus 2 by RT PCR NEGATIVE NEGATIVE Final    Comment: (NOTE) SARS-CoV-2 target nucleic acids are NOT DETECTED.  The SARS-CoV-2 RNA is generally detectable in upper respiratory specimens during the acute phase of infection. The lowest concentration of SARS-CoV-2 viral copies this assay can detect is 138 copies/mL. A negative result does not preclude SARS-Cov-2 infection and should not be used as the sole basis for treatment or other patient management decisions. A negative result may occur with  improper specimen collection/handling, submission of specimen other than nasopharyngeal swab, presence of viral mutation(s) within the areas targeted by this assay, and inadequate number of viral copies(<138 copies/mL). A negative result must be combined with clinical observations,  patient history, and epidemiological information. The expected result is Negative.  Fact Sheet for Patients:  EntrepreneurPulse.com.au  Fact Sheet for Healthcare Providers:  IncredibleEmployment.be  This test is no t yet approved or cleared  by the [place] and  has been authorized for detection and/or diagnosis of SARS-CoV-2 by FDA under an Emergency Use Authorization (EUA). This EUA will remain  in effect (meaning this test can be used) for the duration of the COVID-19 declaration under Section 564(b)(1) of the Act, 21 U.S.C.section 360bbb-3(b)(1), unless the authorization is terminated  or revoked sooner.       Influenza A by PCR NEGATIVE NEGATIVE Final   Influenza B by PCR NEGATIVE NEGATIVE Final    Comment: (NOTE) The Xpert Xpress SARS-CoV-2/FLU/RSV plus assay is intended as an aid in the diagnosis of influenza from Nasopharyngeal swab specimens and should not be used as a sole basis for treatment. Nasal washings and aspirates are unacceptable for Xpert Xpress SARS-CoV-2/FLU/RSV testing.  Fact Sheet for Patients: EntrepreneurPulse.com.au  Fact Sheet for Healthcare Providers: IncredibleEmployment.be  This test is not yet approved or cleared by the [place] FDA and has been authorized for detection and/or diagnosis of SARS-CoV-2 by FDA under an Emergency Use Authorization (EUA). This EUA will remain in effect (meaning this test can be used) for the duration of the COVID-19 declaration under Section 564(b)(1) of the Act, 21 U.S.C. section 360bbb-3(b)(1), unless the authorization is terminated or revoked.  Performed at [facility], [place] [address], [place], [place] [address]   Surgical pcr screen     Status: Abnormal   Collection Time: 02/13/22  7:29 AM   Specimen: Nasal Mucosa; Nasal Swab  Result Value Ref Range Status   MRSA, PCR NEGATIVE NEGATIVE Final   Staphylococcus aureus  POSITIVE (A) NEGATIVE Final    Comment: (NOTE) The Xpert SA Assay (FDA approved for NASAL specimens in patients 73 years of age and older), is one component of a comprehensive surveillance program. It is not intended to diagnose infection nor to guide or monitor treatment. Performed at [facility], [place] [address], [place], [place] [address]     Please note: You were cared for by a hospitalist during your hospital stay. Once you are discharged, your primary care physician will handle any further medical issues. Please note that NO REFILLS for any discharge medications will be authorized once you are discharged, as it is imperative that you return to your primary care physician (or establish a relationship with a primary care physician if you do not have one) for your post hospital discharge needs so that they can reassess your need for medications and monitor your lab values.    Time coordinating discharge: 40 minutes  SIGNED:   Shelly Coss, MD  [facility] 02/15/2022, 10:31 AM Pager [phone]  If 7PM-7AM, please contact night-coverage [email or web address] Password [email or web address]

## 2022-02-15 NOTE — Progress Notes (Signed)
Report given to Amme, staff nurse at [facility]. All questions and concerns were fully answered. ?

## 2022-02-15 NOTE — Assessment & Plan Note (Signed)
Hemoglobin dropped to the range of 10.  Continue monitoring. ?

## 2022-02-15 NOTE — TOC Progression Note (Addendum)
Transition of Care (TOC) - Progression Note  ? ? ?Patient Details  ?Name: Heather [name] ?MRN: [id number] ?Date of Birth: [birth year] ? ?Transition of Care (TOC) CM/SW Contact  ?Joanne Chars, LCSW ?Phone Number: ?02/15/2022, 10:07 AM ? ?Clinical Narrative: CSW clarified with MD that SNF is the plan, CSW spoke with pt in room and she is now agreeable to referral for SNF, preference is Section.  Choice document given, permission given to send out referral in hub.    ? ?CSW spoke with Tammy at [facility] to initiate SNF auth, facility choice pending.  ? ?1100: Auth approved by [facility].  7 days SNF: [id number], [facility] transport: [id number]. ? ?[facility] makes bed offer, pt agrees to this, unable to call [facility] as phone number not working, notified [facility] by email of facility choice.  ? ?Expected Discharge Plan: [place] ?Barriers to Discharge: Continued Medical Work up ? ?Expected Discharge Plan and Services ?Expected Discharge Plan: [place] ?In-house Referral: Clinical Social Work ?  ?Post Acute Care Choice:  (TBD) ?Living arrangements for the past 2 months: [place] ?                ?  ?  ?  ?  ?  ?  ?  ?  ?  ?  ? ? ?Social Determinants of Health (SDOH) Interventions ?  ? ?Readmission Risk Interventions ?No flowsheet data found. ? ?

## 2022-02-15 NOTE — TOC Transition Note (Signed)
Transition of Care (TOC) - CM/SW Discharge Note ? ? ?Patient Details  ?Name: Heather [name] ?MRN: [id number] ?Date of Birth: Dec 21, 1945 ? ?Transition of Care [facility]) CM/SW Contact:  ?Joanne Chars, LCSW ?Phone Number: ?02/15/2022, 1:20 PM ? ? ?Clinical Narrative:Pt discharging to [facility].      ? ? ?RN call [phone] for report.   ? ? ?Final next level of care: [place] ?Barriers to Discharge: Barriers Resolved ? ? ?Patient Goals and CMS Choice ?Patient states their goals for this hospitalization and ongoing recovery are:: walk independently ?  ?  ? ?Discharge Placement ?  ?           ?Patient chooses bed at:  ([facility]) ?Patient to be transferred to facility by: [facility] ?Name of family member notified: daughter Heather [name] ?Patient and family notified of of transfer: 02/15/22 ? ?Discharge Plan and Services ?In-house Referral: Clinical Social Work ?  ?Post Acute Care Choice:  (TBD)          ?  ?  ?  ?  ?  ?  ?  ?  ?  ?  ? ?Social Determinants of Health (SDOH) Interventions ?  ? ? ?Readmission Risk Interventions ?No flowsheet data found. ? ? ? ? ?

## 2022-02-16 DIAGNOSIS — M25562 Pain in left knee: Secondary | ICD-10-CM | POA: Diagnosis not present

## 2022-02-16 DIAGNOSIS — S728X2D Other fracture of left femur, subsequent encounter for closed fracture with routine healing: Secondary | ICD-10-CM | POA: Diagnosis not present

## 2022-02-23 DIAGNOSIS — G2581 Restless legs syndrome: Secondary | ICD-10-CM | POA: Diagnosis not present

## 2022-02-23 DIAGNOSIS — M62838 Other muscle spasm: Secondary | ICD-10-CM | POA: Diagnosis not present

## 2022-02-26 DIAGNOSIS — N1832 Chronic kidney disease, stage 3b: Secondary | ICD-10-CM | POA: Diagnosis not present

## 2022-02-26 DIAGNOSIS — Z7689 Persons encountering health services in other specified circumstances: Secondary | ICD-10-CM | POA: Diagnosis not present

## 2022-02-26 DIAGNOSIS — D649 Anemia, unspecified: Secondary | ICD-10-CM | POA: Diagnosis not present

## 2022-02-27 DIAGNOSIS — S72402D Unspecified fracture of lower end of left femur, subsequent encounter for closed fracture with routine healing: Secondary | ICD-10-CM | POA: Diagnosis not present

## 2022-02-27 DIAGNOSIS — L299 Pruritus, unspecified: Secondary | ICD-10-CM | POA: Diagnosis not present

## 2022-02-27 DIAGNOSIS — M62838 Other muscle spasm: Secondary | ICD-10-CM | POA: Diagnosis not present

## 2022-02-27 DIAGNOSIS — M79605 Pain in left leg: Secondary | ICD-10-CM | POA: Diagnosis not present

## 2022-02-28 DIAGNOSIS — S72462A Displaced supracondylar fracture with intracondylar extension of lower end of left femur, initial encounter for closed fracture: Secondary | ICD-10-CM | POA: Diagnosis not present

## 2022-03-04 IMAGING — CT CT HEAD W/O CM
4 series · 16 of 47 positions shown, 18 images · non-contrast
Comparison: 09/09/2019

CLINICAL DATA: Headache

EXAM:
CT HEAD WITHOUT CONTRAST
TECHNIQUE: Contiguous axial images were obtained from the base of the skull
through the vertex without intravenous contrast.

[Series 3: head wo · axial · 0.41mm/px · z∈[-586,-466]mm · 7 of 34 slices shown, 9 images]
[im 5/34  brain]
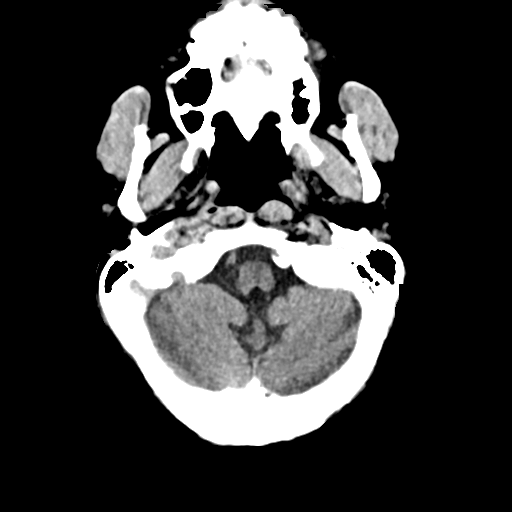
[im 5/34  bone]
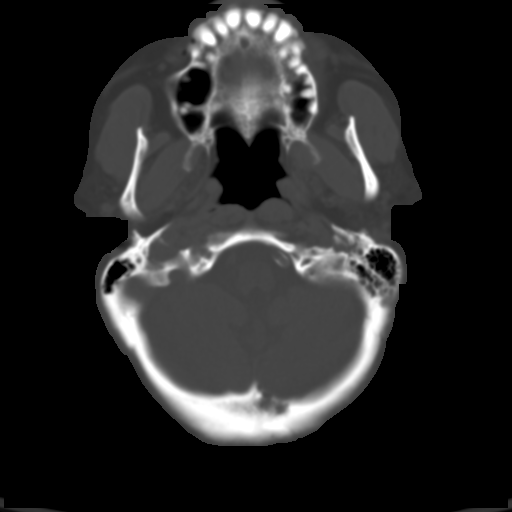
[im 9/34  brain]
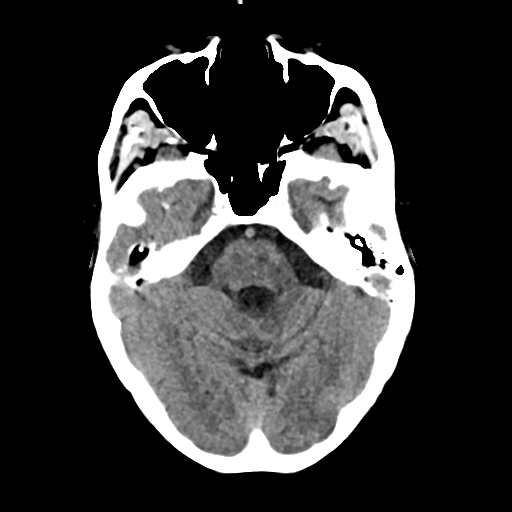
[im 13/34  brain]
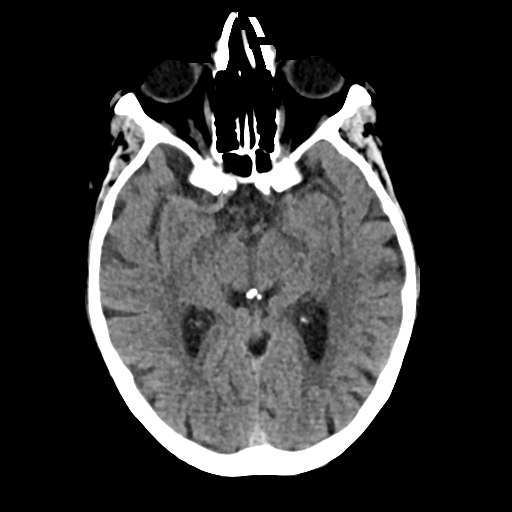
[im 17/34  brain]
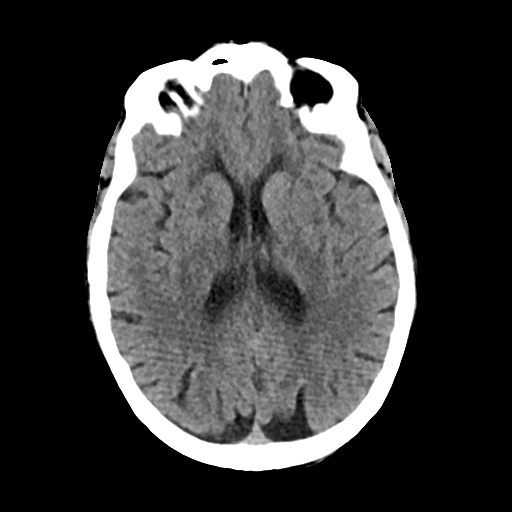
[im 21/34  brain]
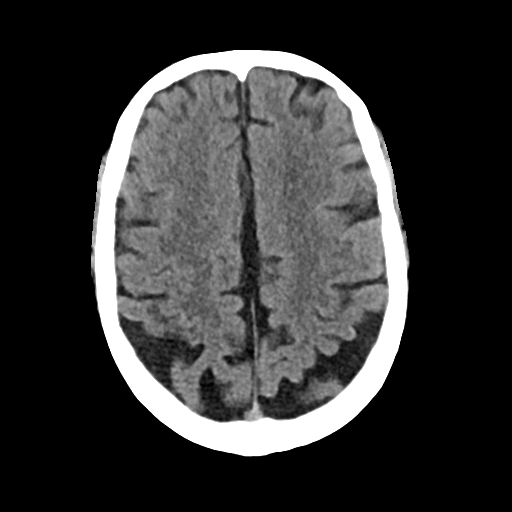
[im 21/34  bone]
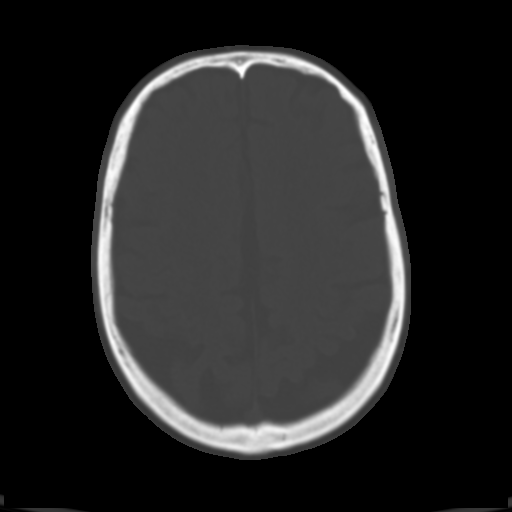
[im 25/34  brain]
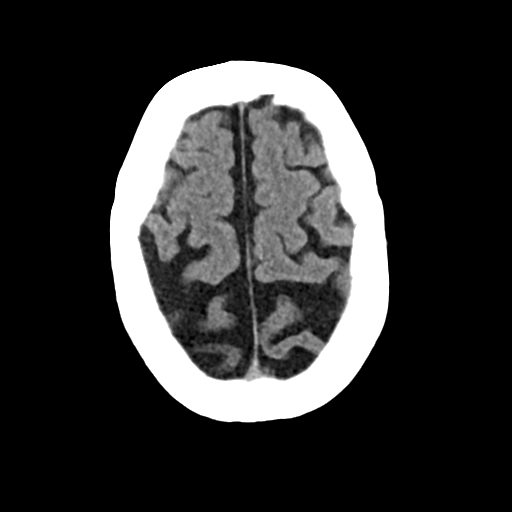
[im 29/34  brain]
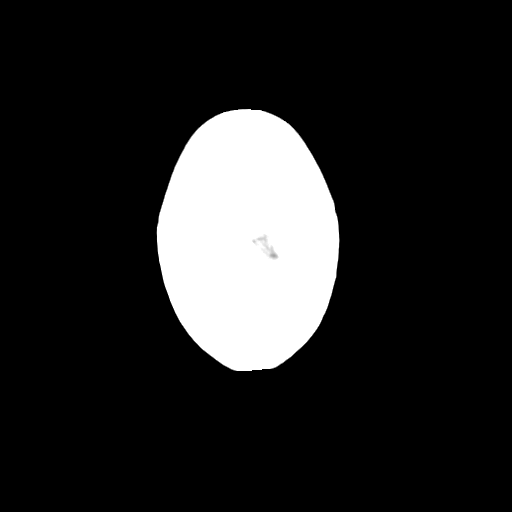

[Series 4: head bone · axial · 0.41mm/px · z∈[-590,-558]mm · 3 of 83 slices shown]
[im 9/83  bone]
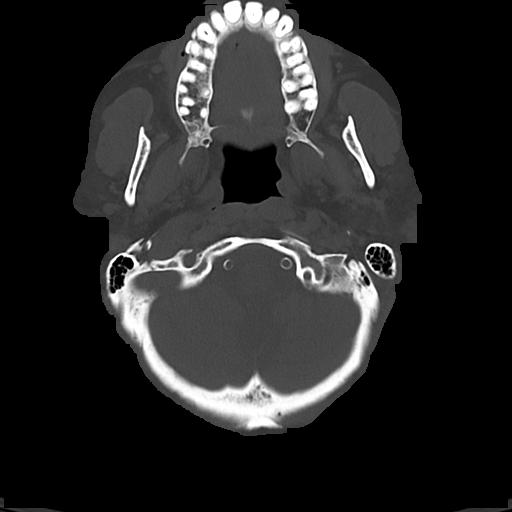
[im 17/83  bone]
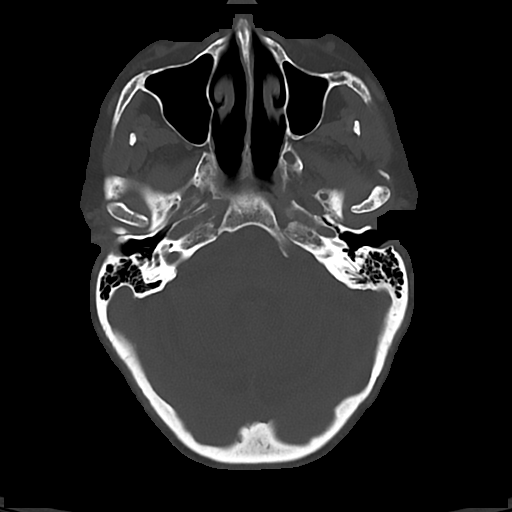
[im 25/83  bone]
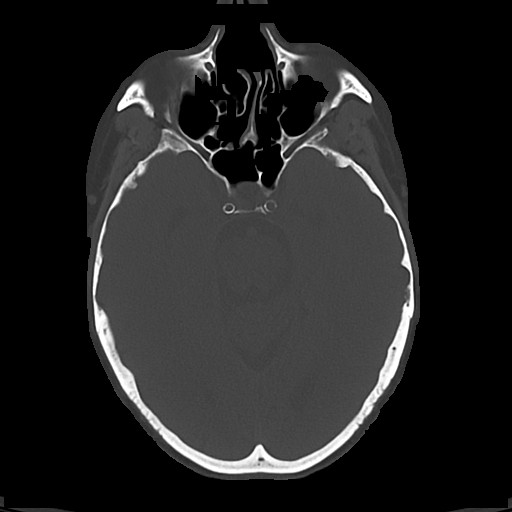

[Series 5: cor soft · coronal · 0.30mm/px · 3 of 64 slices shown]
[im 22/64  brain]
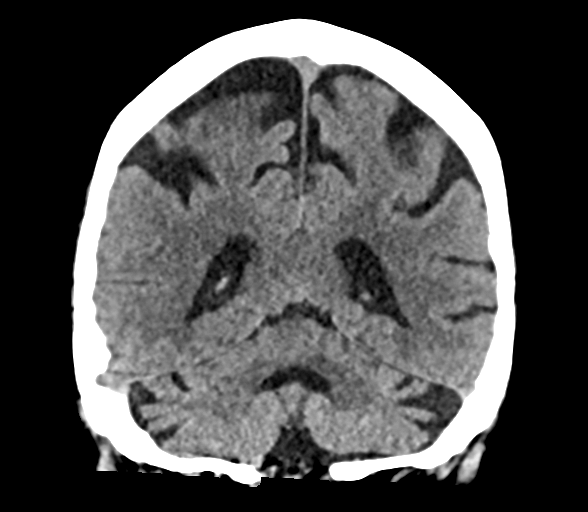
[im 29/64  brain]
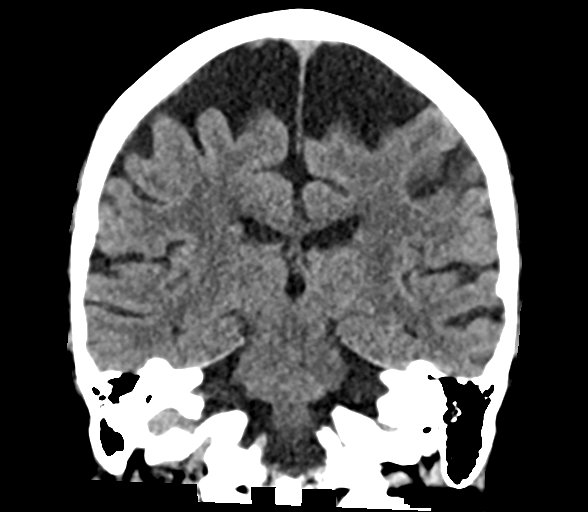
[im 36/64  brain]
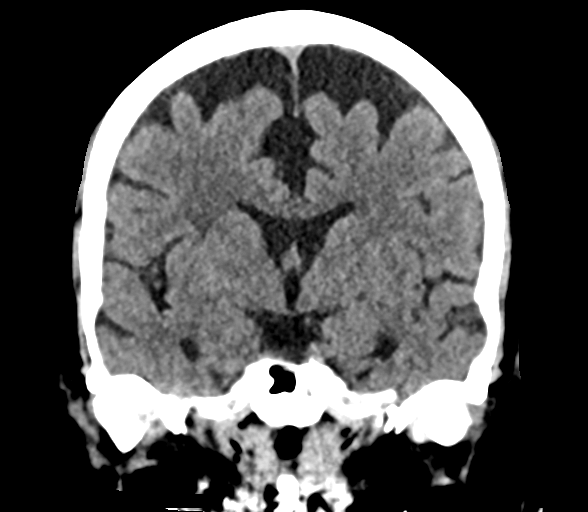

[Series 6: sag soft · sagittal · 0.32mm/px · 3 of 54 slices shown]
[im 18/54  brain]
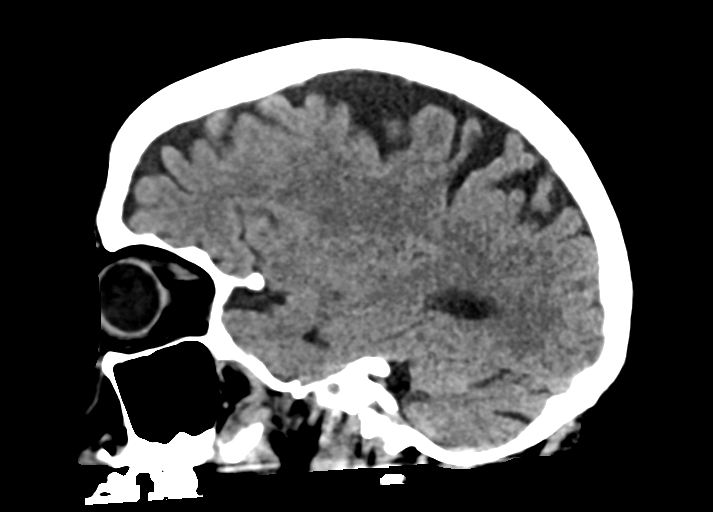
[im 27/54  brain]
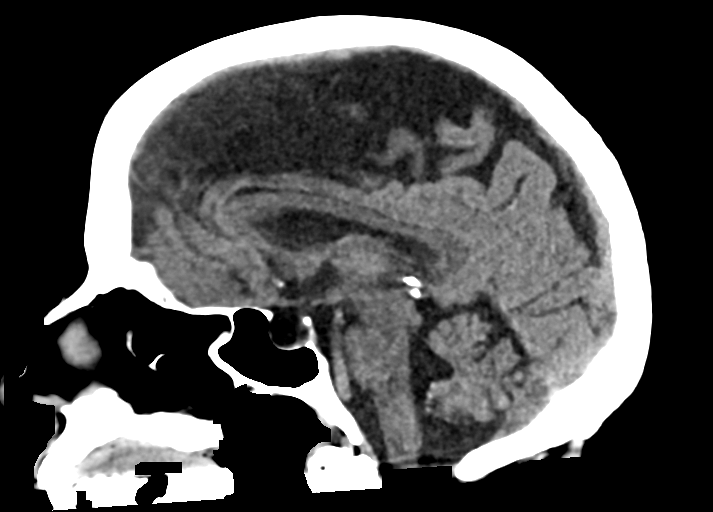
[im 36/54  brain]
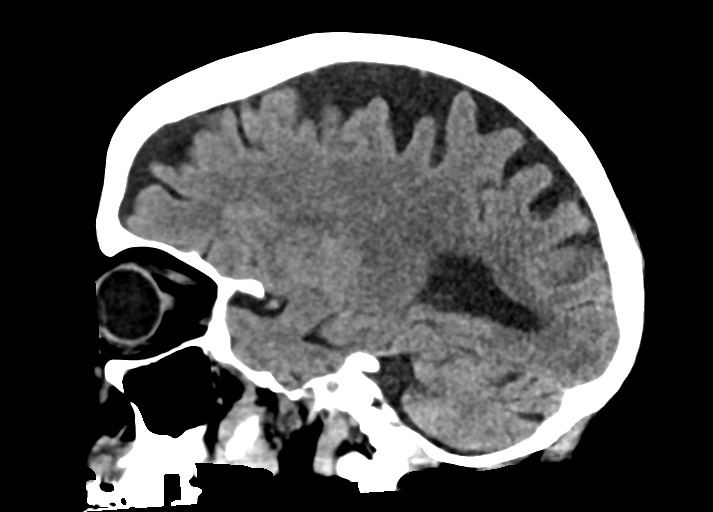

[16 of 47 positions shown; findings below may reference images not displayed]

FINDINGS: Brain: Stable atrophy pattern and minor white matter microvascular
ischemic changes about the lateral ventricles bilaterally. No acute
intracranial hemorrhage, new mass lesion, new infarction midline
shift herniation, hydrocephalus, or extra-axial fluid collection. No
focal mass effect or edema. Cisterns are patent. Cerebellar atrophy
as well.

Vascular: No hyperdense vessel or unexpected calcification.

Skull: Normal. Negative for fracture or focal lesion.

Sinuses/Orbits: No acute finding.

Other: None.
IMPRESSION: Stable atrophy pattern and white matter microvascular ischemic
changes.

No acute intracranial abnormality by noncontrast CT.

## 2022-03-09 DIAGNOSIS — Z9049 Acquired absence of other specified parts of digestive tract: Secondary | ICD-10-CM | POA: Diagnosis not present

## 2022-03-09 DIAGNOSIS — R197 Diarrhea, unspecified: Secondary | ICD-10-CM | POA: Diagnosis not present

## 2022-03-09 DIAGNOSIS — E1142 Type 2 diabetes mellitus with diabetic polyneuropathy: Secondary | ICD-10-CM | POA: Diagnosis not present

## 2022-03-09 DIAGNOSIS — I482 Chronic atrial fibrillation, unspecified: Secondary | ICD-10-CM | POA: Diagnosis not present

## 2022-03-09 DIAGNOSIS — N1832 Chronic kidney disease, stage 3b: Secondary | ICD-10-CM | POA: Diagnosis not present

## 2022-03-12 DIAGNOSIS — R062 Wheezing: Secondary | ICD-10-CM | POA: Diagnosis not present

## 2022-03-12 DIAGNOSIS — R051 Acute cough: Secondary | ICD-10-CM | POA: Diagnosis not present

## 2022-03-12 DIAGNOSIS — Z1152 Encounter for screening for COVID-19: Secondary | ICD-10-CM | POA: Diagnosis not present

## 2022-03-12 DIAGNOSIS — J984 Other disorders of lung: Secondary | ICD-10-CM | POA: Diagnosis not present

## 2022-03-13 DIAGNOSIS — Z7689 Persons encountering health services in other specified circumstances: Secondary | ICD-10-CM | POA: Diagnosis not present

## 2022-03-13 DIAGNOSIS — R062 Wheezing: Secondary | ICD-10-CM | POA: Diagnosis not present

## 2022-03-13 DIAGNOSIS — R051 Acute cough: Secondary | ICD-10-CM | POA: Diagnosis not present

## 2022-03-13 DIAGNOSIS — J189 Pneumonia, unspecified organism: Secondary | ICD-10-CM | POA: Diagnosis not present

## 2022-03-14 DIAGNOSIS — R051 Acute cough: Secondary | ICD-10-CM | POA: Diagnosis not present

## 2022-03-14 DIAGNOSIS — J189 Pneumonia, unspecified organism: Secondary | ICD-10-CM | POA: Diagnosis not present

## 2022-03-14 DIAGNOSIS — R062 Wheezing: Secondary | ICD-10-CM | POA: Diagnosis not present

## 2022-03-15 DIAGNOSIS — J189 Pneumonia, unspecified organism: Secondary | ICD-10-CM | POA: Diagnosis not present

## 2022-03-20 DIAGNOSIS — R051 Acute cough: Secondary | ICD-10-CM | POA: Diagnosis not present

## 2022-03-20 DIAGNOSIS — J189 Pneumonia, unspecified organism: Secondary | ICD-10-CM | POA: Diagnosis not present

## 2022-03-20 DIAGNOSIS — R093 Abnormal sputum: Secondary | ICD-10-CM | POA: Diagnosis not present

## 2022-03-21 DIAGNOSIS — S72462D Displaced supracondylar fracture with intracondylar extension of lower end of left femur, subsequent encounter for closed fracture with routine healing: Secondary | ICD-10-CM | POA: Diagnosis not present

## 2022-03-29 DIAGNOSIS — S72402D Unspecified fracture of lower end of left femur, subsequent encounter for closed fracture with routine healing: Secondary | ICD-10-CM | POA: Diagnosis not present

## 2022-04-03 DIAGNOSIS — G510 Bell's palsy: Secondary | ICD-10-CM | POA: Diagnosis not present

## 2022-04-03 DIAGNOSIS — Z96643 Presence of artificial hip joint, bilateral: Secondary | ICD-10-CM | POA: Diagnosis not present

## 2022-04-03 DIAGNOSIS — J984 Other disorders of lung: Secondary | ICD-10-CM | POA: Diagnosis not present

## 2022-04-03 DIAGNOSIS — F32A Depression, unspecified: Secondary | ICD-10-CM | POA: Diagnosis not present

## 2022-04-03 DIAGNOSIS — E1142 Type 2 diabetes mellitus with diabetic polyneuropathy: Secondary | ICD-10-CM | POA: Diagnosis not present

## 2022-04-03 DIAGNOSIS — E113593 Type 2 diabetes mellitus with proliferative diabetic retinopathy without macular edema, bilateral: Secondary | ICD-10-CM | POA: Diagnosis not present

## 2022-04-03 DIAGNOSIS — M1990 Unspecified osteoarthritis, unspecified site: Secondary | ICD-10-CM | POA: Diagnosis not present

## 2022-04-03 DIAGNOSIS — E1122 Type 2 diabetes mellitus with diabetic chronic kidney disease: Secondary | ICD-10-CM | POA: Diagnosis not present

## 2022-04-03 DIAGNOSIS — I129 Hypertensive chronic kidney disease with stage 1 through stage 4 chronic kidney disease, or unspecified chronic kidney disease: Secondary | ICD-10-CM | POA: Diagnosis not present

## 2022-04-03 DIAGNOSIS — M80052D Age-related osteoporosis with current pathological fracture, left femur, subsequent encounter for fracture with routine healing: Secondary | ICD-10-CM | POA: Diagnosis not present

## 2022-04-03 DIAGNOSIS — Z6830 Body mass index (BMI) 30.0-30.9, adult: Secondary | ICD-10-CM | POA: Diagnosis not present

## 2022-04-03 DIAGNOSIS — G2581 Restless legs syndrome: Secondary | ICD-10-CM | POA: Diagnosis not present

## 2022-04-03 DIAGNOSIS — N183 Chronic kidney disease, stage 3 unspecified: Secondary | ICD-10-CM | POA: Diagnosis not present

## 2022-04-03 DIAGNOSIS — K219 Gastro-esophageal reflux disease without esophagitis: Secondary | ICD-10-CM | POA: Diagnosis not present

## 2022-04-03 DIAGNOSIS — Z7901 Long term (current) use of anticoagulants: Secondary | ICD-10-CM | POA: Diagnosis not present

## 2022-04-03 DIAGNOSIS — D631 Anemia in chronic kidney disease: Secondary | ICD-10-CM | POA: Diagnosis not present

## 2022-04-03 DIAGNOSIS — Z794 Long term (current) use of insulin: Secondary | ICD-10-CM | POA: Diagnosis not present

## 2022-04-03 DIAGNOSIS — M9712XD Periprosthetic fracture around internal prosthetic left knee joint, subsequent encounter: Secondary | ICD-10-CM | POA: Diagnosis not present

## 2022-04-03 DIAGNOSIS — G4733 Obstructive sleep apnea (adult) (pediatric): Secondary | ICD-10-CM | POA: Diagnosis not present

## 2022-04-03 DIAGNOSIS — I4891 Unspecified atrial fibrillation: Secondary | ICD-10-CM | POA: Diagnosis not present

## 2022-04-03 DIAGNOSIS — E785 Hyperlipidemia, unspecified: Secondary | ICD-10-CM | POA: Diagnosis not present

## 2022-04-03 DIAGNOSIS — J189 Pneumonia, unspecified organism: Secondary | ICD-10-CM | POA: Diagnosis not present

## 2022-04-03 DIAGNOSIS — Z7984 Long term (current) use of oral hypoglycemic drugs: Secondary | ICD-10-CM | POA: Diagnosis not present

## 2022-04-09 DIAGNOSIS — J189 Pneumonia, unspecified organism: Secondary | ICD-10-CM | POA: Diagnosis not present

## 2022-04-09 DIAGNOSIS — F32A Depression, unspecified: Secondary | ICD-10-CM | POA: Diagnosis not present

## 2022-04-09 DIAGNOSIS — I4891 Unspecified atrial fibrillation: Secondary | ICD-10-CM | POA: Diagnosis not present

## 2022-04-09 DIAGNOSIS — Z7984 Long term (current) use of oral hypoglycemic drugs: Secondary | ICD-10-CM | POA: Diagnosis not present

## 2022-04-09 DIAGNOSIS — G4733 Obstructive sleep apnea (adult) (pediatric): Secondary | ICD-10-CM | POA: Diagnosis not present

## 2022-04-09 DIAGNOSIS — E785 Hyperlipidemia, unspecified: Secondary | ICD-10-CM | POA: Diagnosis not present

## 2022-04-09 DIAGNOSIS — N183 Chronic kidney disease, stage 3 unspecified: Secondary | ICD-10-CM | POA: Diagnosis not present

## 2022-04-09 DIAGNOSIS — J984 Other disorders of lung: Secondary | ICD-10-CM | POA: Diagnosis not present

## 2022-04-09 DIAGNOSIS — M1990 Unspecified osteoarthritis, unspecified site: Secondary | ICD-10-CM | POA: Diagnosis not present

## 2022-04-09 DIAGNOSIS — Z7901 Long term (current) use of anticoagulants: Secondary | ICD-10-CM | POA: Diagnosis not present

## 2022-04-09 DIAGNOSIS — E1122 Type 2 diabetes mellitus with diabetic chronic kidney disease: Secondary | ICD-10-CM | POA: Diagnosis not present

## 2022-04-09 DIAGNOSIS — Z96643 Presence of artificial hip joint, bilateral: Secondary | ICD-10-CM | POA: Diagnosis not present

## 2022-04-09 DIAGNOSIS — Z6830 Body mass index (BMI) 30.0-30.9, adult: Secondary | ICD-10-CM | POA: Diagnosis not present

## 2022-04-09 DIAGNOSIS — E113593 Type 2 diabetes mellitus with proliferative diabetic retinopathy without macular edema, bilateral: Secondary | ICD-10-CM | POA: Diagnosis not present

## 2022-04-09 DIAGNOSIS — G2581 Restless legs syndrome: Secondary | ICD-10-CM | POA: Diagnosis not present

## 2022-04-09 DIAGNOSIS — G510 Bell's palsy: Secondary | ICD-10-CM | POA: Diagnosis not present

## 2022-04-09 DIAGNOSIS — M80052D Age-related osteoporosis with current pathological fracture, left femur, subsequent encounter for fracture with routine healing: Secondary | ICD-10-CM | POA: Diagnosis not present

## 2022-04-09 DIAGNOSIS — E1142 Type 2 diabetes mellitus with diabetic polyneuropathy: Secondary | ICD-10-CM | POA: Diagnosis not present

## 2022-04-09 DIAGNOSIS — K219 Gastro-esophageal reflux disease without esophagitis: Secondary | ICD-10-CM | POA: Diagnosis not present

## 2022-04-09 DIAGNOSIS — D631 Anemia in chronic kidney disease: Secondary | ICD-10-CM | POA: Diagnosis not present

## 2022-04-09 DIAGNOSIS — M9712XD Periprosthetic fracture around internal prosthetic left knee joint, subsequent encounter: Secondary | ICD-10-CM | POA: Diagnosis not present

## 2022-04-09 DIAGNOSIS — Z794 Long term (current) use of insulin: Secondary | ICD-10-CM | POA: Diagnosis not present

## 2022-04-09 DIAGNOSIS — I129 Hypertensive chronic kidney disease with stage 1 through stage 4 chronic kidney disease, or unspecified chronic kidney disease: Secondary | ICD-10-CM | POA: Diagnosis not present

## 2022-04-10 DIAGNOSIS — S72402D Unspecified fracture of lower end of left femur, subsequent encounter for closed fracture with routine healing: Secondary | ICD-10-CM | POA: Diagnosis not present

## 2022-04-11 DIAGNOSIS — S72462D Displaced supracondylar fracture with intracondylar extension of lower end of left femur, subsequent encounter for closed fracture with routine healing: Secondary | ICD-10-CM | POA: Diagnosis not present

## 2022-04-13 DIAGNOSIS — F32A Depression, unspecified: Secondary | ICD-10-CM | POA: Diagnosis not present

## 2022-04-13 DIAGNOSIS — Z96643 Presence of artificial hip joint, bilateral: Secondary | ICD-10-CM | POA: Diagnosis not present

## 2022-04-13 DIAGNOSIS — M9712XD Periprosthetic fracture around internal prosthetic left knee joint, subsequent encounter: Secondary | ICD-10-CM | POA: Diagnosis not present

## 2022-04-13 DIAGNOSIS — J189 Pneumonia, unspecified organism: Secondary | ICD-10-CM | POA: Diagnosis not present

## 2022-04-13 DIAGNOSIS — E1122 Type 2 diabetes mellitus with diabetic chronic kidney disease: Secondary | ICD-10-CM | POA: Diagnosis not present

## 2022-04-13 DIAGNOSIS — Z7984 Long term (current) use of oral hypoglycemic drugs: Secondary | ICD-10-CM | POA: Diagnosis not present

## 2022-04-13 DIAGNOSIS — M1990 Unspecified osteoarthritis, unspecified site: Secondary | ICD-10-CM | POA: Diagnosis not present

## 2022-04-13 DIAGNOSIS — Z6830 Body mass index (BMI) 30.0-30.9, adult: Secondary | ICD-10-CM | POA: Diagnosis not present

## 2022-04-13 DIAGNOSIS — E1142 Type 2 diabetes mellitus with diabetic polyneuropathy: Secondary | ICD-10-CM | POA: Diagnosis not present

## 2022-04-13 DIAGNOSIS — G2581 Restless legs syndrome: Secondary | ICD-10-CM | POA: Diagnosis not present

## 2022-04-13 DIAGNOSIS — I129 Hypertensive chronic kidney disease with stage 1 through stage 4 chronic kidney disease, or unspecified chronic kidney disease: Secondary | ICD-10-CM | POA: Diagnosis not present

## 2022-04-13 DIAGNOSIS — Z7901 Long term (current) use of anticoagulants: Secondary | ICD-10-CM | POA: Diagnosis not present

## 2022-04-13 DIAGNOSIS — K219 Gastro-esophageal reflux disease without esophagitis: Secondary | ICD-10-CM | POA: Diagnosis not present

## 2022-04-13 DIAGNOSIS — N183 Chronic kidney disease, stage 3 unspecified: Secondary | ICD-10-CM | POA: Diagnosis not present

## 2022-04-13 DIAGNOSIS — G4733 Obstructive sleep apnea (adult) (pediatric): Secondary | ICD-10-CM | POA: Diagnosis not present

## 2022-04-13 DIAGNOSIS — Z794 Long term (current) use of insulin: Secondary | ICD-10-CM | POA: Diagnosis not present

## 2022-04-13 DIAGNOSIS — M80052D Age-related osteoporosis with current pathological fracture, left femur, subsequent encounter for fracture with routine healing: Secondary | ICD-10-CM | POA: Diagnosis not present

## 2022-04-13 DIAGNOSIS — I4891 Unspecified atrial fibrillation: Secondary | ICD-10-CM | POA: Diagnosis not present

## 2022-04-13 DIAGNOSIS — E785 Hyperlipidemia, unspecified: Secondary | ICD-10-CM | POA: Diagnosis not present

## 2022-04-13 DIAGNOSIS — D631 Anemia in chronic kidney disease: Secondary | ICD-10-CM | POA: Diagnosis not present

## 2022-04-13 DIAGNOSIS — J984 Other disorders of lung: Secondary | ICD-10-CM | POA: Diagnosis not present

## 2022-04-13 DIAGNOSIS — G510 Bell's palsy: Secondary | ICD-10-CM | POA: Diagnosis not present

## 2022-04-13 DIAGNOSIS — E113593 Type 2 diabetes mellitus with proliferative diabetic retinopathy without macular edema, bilateral: Secondary | ICD-10-CM | POA: Diagnosis not present

## 2022-04-18 DIAGNOSIS — E1121 Type 2 diabetes mellitus with diabetic nephropathy: Secondary | ICD-10-CM | POA: Diagnosis not present

## 2022-04-18 DIAGNOSIS — Z6829 Body mass index (BMI) 29.0-29.9, adult: Secondary | ICD-10-CM | POA: Diagnosis not present

## 2022-04-18 DIAGNOSIS — I4891 Unspecified atrial fibrillation: Secondary | ICD-10-CM | POA: Diagnosis not present

## 2022-04-18 DIAGNOSIS — N1832 Chronic kidney disease, stage 3b: Secondary | ICD-10-CM | POA: Diagnosis not present

## 2022-04-18 DIAGNOSIS — S728X2D Other fracture of left femur, subsequent encounter for closed fracture with routine healing: Secondary | ICD-10-CM | POA: Diagnosis not present

## 2022-04-18 DIAGNOSIS — F324 Major depressive disorder, single episode, in partial remission: Secondary | ICD-10-CM | POA: Diagnosis not present

## 2022-04-19 DIAGNOSIS — Z6829 Body mass index (BMI) 29.0-29.9, adult: Secondary | ICD-10-CM | POA: Diagnosis not present

## 2022-04-19 DIAGNOSIS — E1121 Type 2 diabetes mellitus with diabetic nephropathy: Secondary | ICD-10-CM | POA: Diagnosis not present

## 2022-04-19 DIAGNOSIS — I4891 Unspecified atrial fibrillation: Secondary | ICD-10-CM | POA: Diagnosis not present

## 2022-04-19 DIAGNOSIS — N1832 Chronic kidney disease, stage 3b: Secondary | ICD-10-CM | POA: Diagnosis not present

## 2022-04-24 DIAGNOSIS — H353131 Nonexudative age-related macular degeneration, bilateral, early dry stage: Secondary | ICD-10-CM | POA: Diagnosis not present

## 2022-04-25 DIAGNOSIS — M9712XD Periprosthetic fracture around internal prosthetic left knee joint, subsequent encounter: Secondary | ICD-10-CM | POA: Diagnosis not present

## 2022-04-25 DIAGNOSIS — E1142 Type 2 diabetes mellitus with diabetic polyneuropathy: Secondary | ICD-10-CM | POA: Diagnosis not present

## 2022-04-25 DIAGNOSIS — I129 Hypertensive chronic kidney disease with stage 1 through stage 4 chronic kidney disease, or unspecified chronic kidney disease: Secondary | ICD-10-CM | POA: Diagnosis not present

## 2022-04-25 DIAGNOSIS — G510 Bell's palsy: Secondary | ICD-10-CM | POA: Diagnosis not present

## 2022-04-25 DIAGNOSIS — K219 Gastro-esophageal reflux disease without esophagitis: Secondary | ICD-10-CM | POA: Diagnosis not present

## 2022-04-25 DIAGNOSIS — E785 Hyperlipidemia, unspecified: Secondary | ICD-10-CM | POA: Diagnosis not present

## 2022-04-25 DIAGNOSIS — J189 Pneumonia, unspecified organism: Secondary | ICD-10-CM | POA: Diagnosis not present

## 2022-04-25 DIAGNOSIS — I4891 Unspecified atrial fibrillation: Secondary | ICD-10-CM | POA: Diagnosis not present

## 2022-04-25 DIAGNOSIS — Z96643 Presence of artificial hip joint, bilateral: Secondary | ICD-10-CM | POA: Diagnosis not present

## 2022-04-25 DIAGNOSIS — E1122 Type 2 diabetes mellitus with diabetic chronic kidney disease: Secondary | ICD-10-CM | POA: Diagnosis not present

## 2022-04-25 DIAGNOSIS — Z794 Long term (current) use of insulin: Secondary | ICD-10-CM | POA: Diagnosis not present

## 2022-04-25 DIAGNOSIS — M80052D Age-related osteoporosis with current pathological fracture, left femur, subsequent encounter for fracture with routine healing: Secondary | ICD-10-CM | POA: Diagnosis not present

## 2022-04-25 DIAGNOSIS — M1990 Unspecified osteoarthritis, unspecified site: Secondary | ICD-10-CM | POA: Diagnosis not present

## 2022-04-25 DIAGNOSIS — G4733 Obstructive sleep apnea (adult) (pediatric): Secondary | ICD-10-CM | POA: Diagnosis not present

## 2022-04-25 DIAGNOSIS — G2581 Restless legs syndrome: Secondary | ICD-10-CM | POA: Diagnosis not present

## 2022-04-25 DIAGNOSIS — F32A Depression, unspecified: Secondary | ICD-10-CM | POA: Diagnosis not present

## 2022-04-25 DIAGNOSIS — N183 Chronic kidney disease, stage 3 unspecified: Secondary | ICD-10-CM | POA: Diagnosis not present

## 2022-04-25 DIAGNOSIS — Z7984 Long term (current) use of oral hypoglycemic drugs: Secondary | ICD-10-CM | POA: Diagnosis not present

## 2022-04-25 DIAGNOSIS — Z7901 Long term (current) use of anticoagulants: Secondary | ICD-10-CM | POA: Diagnosis not present

## 2022-04-25 DIAGNOSIS — D631 Anemia in chronic kidney disease: Secondary | ICD-10-CM | POA: Diagnosis not present

## 2022-04-25 DIAGNOSIS — E113593 Type 2 diabetes mellitus with proliferative diabetic retinopathy without macular edema, bilateral: Secondary | ICD-10-CM | POA: Diagnosis not present

## 2022-04-25 DIAGNOSIS — J984 Other disorders of lung: Secondary | ICD-10-CM | POA: Diagnosis not present

## 2022-04-25 DIAGNOSIS — Z6830 Body mass index (BMI) 30.0-30.9, adult: Secondary | ICD-10-CM | POA: Diagnosis not present

## 2022-04-30 ENCOUNTER — Encounter (INDEPENDENT_AMBULATORY_CARE_PROVIDER_SITE_OTHER): Payer: PPO | Admitting: Ophthalmology

## 2022-04-30 DIAGNOSIS — H43813 Vitreous degeneration, bilateral: Secondary | ICD-10-CM | POA: Diagnosis not present

## 2022-04-30 DIAGNOSIS — H353132 Nonexudative age-related macular degeneration, bilateral, intermediate dry stage: Secondary | ICD-10-CM

## 2022-04-30 DIAGNOSIS — E113391 Type 2 diabetes mellitus with moderate nonproliferative diabetic retinopathy without macular edema, right eye: Secondary | ICD-10-CM | POA: Diagnosis not present

## 2022-04-30 DIAGNOSIS — E113592 Type 2 diabetes mellitus with proliferative diabetic retinopathy without macular edema, left eye: Secondary | ICD-10-CM | POA: Diagnosis not present

## 2022-05-03 DIAGNOSIS — R2681 Unsteadiness on feet: Secondary | ICD-10-CM | POA: Diagnosis not present

## 2022-05-03 DIAGNOSIS — S72402S Unspecified fracture of lower end of left femur, sequela: Secondary | ICD-10-CM | POA: Diagnosis not present

## 2022-05-03 DIAGNOSIS — M79605 Pain in left leg: Secondary | ICD-10-CM | POA: Diagnosis not present

## 2022-05-08 DIAGNOSIS — S2020XA Contusion of thorax, unspecified, initial encounter: Secondary | ICD-10-CM | POA: Diagnosis not present

## 2022-05-09 DIAGNOSIS — S72462D Displaced supracondylar fracture with intracondylar extension of lower end of left femur, subsequent encounter for closed fracture with routine healing: Secondary | ICD-10-CM | POA: Diagnosis not present

## 2022-05-09 DIAGNOSIS — M25552 Pain in left hip: Secondary | ICD-10-CM | POA: Diagnosis not present

## 2022-05-09 DIAGNOSIS — E1142 Type 2 diabetes mellitus with diabetic polyneuropathy: Secondary | ICD-10-CM | POA: Diagnosis not present

## 2022-05-09 DIAGNOSIS — I482 Chronic atrial fibrillation, unspecified: Secondary | ICD-10-CM | POA: Diagnosis not present

## 2022-05-09 DIAGNOSIS — N1832 Chronic kidney disease, stage 3b: Secondary | ICD-10-CM | POA: Diagnosis not present

## 2022-05-15 DIAGNOSIS — M79605 Pain in left leg: Secondary | ICD-10-CM | POA: Diagnosis not present

## 2022-05-15 DIAGNOSIS — R2681 Unsteadiness on feet: Secondary | ICD-10-CM | POA: Diagnosis not present

## 2022-05-15 DIAGNOSIS — S72402S Unspecified fracture of lower end of left femur, sequela: Secondary | ICD-10-CM | POA: Diagnosis not present

## 2022-05-17 DIAGNOSIS — I110 Hypertensive heart disease with heart failure: Secondary | ICD-10-CM | POA: Diagnosis not present

## 2022-05-17 DIAGNOSIS — E1122 Type 2 diabetes mellitus with diabetic chronic kidney disease: Secondary | ICD-10-CM | POA: Diagnosis not present

## 2022-05-17 DIAGNOSIS — Z794 Long term (current) use of insulin: Secondary | ICD-10-CM | POA: Diagnosis not present

## 2022-05-17 DIAGNOSIS — I5032 Chronic diastolic (congestive) heart failure: Secondary | ICD-10-CM | POA: Diagnosis not present

## 2022-05-17 DIAGNOSIS — G4733 Obstructive sleep apnea (adult) (pediatric): Secondary | ICD-10-CM | POA: Diagnosis not present

## 2022-05-17 DIAGNOSIS — I4821 Permanent atrial fibrillation: Secondary | ICD-10-CM | POA: Diagnosis not present

## 2022-05-17 DIAGNOSIS — N1832 Chronic kidney disease, stage 3b: Secondary | ICD-10-CM | POA: Diagnosis not present

## 2022-05-17 DIAGNOSIS — E669 Obesity, unspecified: Secondary | ICD-10-CM | POA: Diagnosis not present

## 2022-05-24 DIAGNOSIS — S72402S Unspecified fracture of lower end of left femur, sequela: Secondary | ICD-10-CM | POA: Diagnosis not present

## 2022-05-24 DIAGNOSIS — R2681 Unsteadiness on feet: Secondary | ICD-10-CM | POA: Diagnosis not present

## 2022-05-24 DIAGNOSIS — M79605 Pain in left leg: Secondary | ICD-10-CM | POA: Diagnosis not present

## 2022-06-04 ENCOUNTER — Encounter (INDEPENDENT_AMBULATORY_CARE_PROVIDER_SITE_OTHER): Payer: PPO | Admitting: Ophthalmology

## 2022-06-08 DIAGNOSIS — E1121 Type 2 diabetes mellitus with diabetic nephropathy: Secondary | ICD-10-CM | POA: Diagnosis not present

## 2022-06-08 DIAGNOSIS — E1129 Type 2 diabetes mellitus with other diabetic kidney complication: Secondary | ICD-10-CM | POA: Diagnosis not present

## 2022-06-08 DIAGNOSIS — I482 Chronic atrial fibrillation, unspecified: Secondary | ICD-10-CM | POA: Diagnosis not present

## 2022-06-26 DIAGNOSIS — I209 Angina pectoris, unspecified: Secondary | ICD-10-CM | POA: Diagnosis not present

## 2022-07-09 DIAGNOSIS — E1142 Type 2 diabetes mellitus with diabetic polyneuropathy: Secondary | ICD-10-CM | POA: Diagnosis not present

## 2022-07-09 DIAGNOSIS — N1832 Chronic kidney disease, stage 3b: Secondary | ICD-10-CM | POA: Diagnosis not present

## 2022-09-05 DIAGNOSIS — I5032 Chronic diastolic (congestive) heart failure: Secondary | ICD-10-CM | POA: Diagnosis not present

## 2022-09-05 DIAGNOSIS — Z794 Long term (current) use of insulin: Secondary | ICD-10-CM | POA: Diagnosis not present

## 2022-09-05 DIAGNOSIS — Z23 Encounter for immunization: Secondary | ICD-10-CM | POA: Diagnosis not present

## 2022-09-05 DIAGNOSIS — N1832 Chronic kidney disease, stage 3b: Secondary | ICD-10-CM | POA: Diagnosis not present

## 2022-09-05 DIAGNOSIS — R030 Elevated blood-pressure reading, without diagnosis of hypertension: Secondary | ICD-10-CM | POA: Diagnosis not present

## 2022-09-05 DIAGNOSIS — I700 Atherosclerosis of aorta: Secondary | ICD-10-CM | POA: Diagnosis not present

## 2022-09-05 DIAGNOSIS — E1121 Type 2 diabetes mellitus with diabetic nephropathy: Secondary | ICD-10-CM | POA: Diagnosis not present

## 2022-10-09 DIAGNOSIS — I482 Chronic atrial fibrillation, unspecified: Secondary | ICD-10-CM | POA: Diagnosis not present

## 2022-10-09 DIAGNOSIS — E1142 Type 2 diabetes mellitus with diabetic polyneuropathy: Secondary | ICD-10-CM | POA: Diagnosis not present

## 2022-10-09 DIAGNOSIS — N1832 Chronic kidney disease, stage 3b: Secondary | ICD-10-CM | POA: Diagnosis not present

## 2022-10-24 DIAGNOSIS — H353132 Nonexudative age-related macular degeneration, bilateral, intermediate dry stage: Secondary | ICD-10-CM | POA: Diagnosis not present

## 2022-10-24 DIAGNOSIS — H43391 Other vitreous opacities, right eye: Secondary | ICD-10-CM | POA: Diagnosis not present

## 2022-10-24 DIAGNOSIS — H4323 Crystalline deposits in vitreous body, bilateral: Secondary | ICD-10-CM | POA: Diagnosis not present

## 2022-10-24 DIAGNOSIS — H35372 Puckering of macula, left eye: Secondary | ICD-10-CM | POA: Diagnosis not present

## 2022-10-29 ENCOUNTER — Encounter (INDEPENDENT_AMBULATORY_CARE_PROVIDER_SITE_OTHER): Payer: PPO | Admitting: Ophthalmology

## 2022-11-08 DIAGNOSIS — I482 Chronic atrial fibrillation, unspecified: Secondary | ICD-10-CM | POA: Diagnosis not present

## 2022-11-08 DIAGNOSIS — E1129 Type 2 diabetes mellitus with other diabetic kidney complication: Secondary | ICD-10-CM | POA: Diagnosis not present

## 2022-11-08 DIAGNOSIS — E1121 Type 2 diabetes mellitus with diabetic nephropathy: Secondary | ICD-10-CM | POA: Diagnosis not present

## 2022-12-17 DIAGNOSIS — J209 Acute bronchitis, unspecified: Secondary | ICD-10-CM | POA: Diagnosis not present

## 2023-01-07 DIAGNOSIS — J22 Unspecified acute lower respiratory infection: Secondary | ICD-10-CM | POA: Diagnosis not present

## 2023-01-14 DIAGNOSIS — D0511 Intraductal carcinoma in situ of right breast: Secondary | ICD-10-CM | POA: Diagnosis not present

## 2023-01-14 DIAGNOSIS — N644 Mastodynia: Secondary | ICD-10-CM | POA: Diagnosis not present

## 2023-01-24 DIAGNOSIS — I5032 Chronic diastolic (congestive) heart failure: Secondary | ICD-10-CM | POA: Diagnosis not present

## 2023-01-24 DIAGNOSIS — I700 Atherosclerosis of aorta: Secondary | ICD-10-CM | POA: Diagnosis not present

## 2023-01-24 DIAGNOSIS — Z794 Long term (current) use of insulin: Secondary | ICD-10-CM | POA: Diagnosis not present

## 2023-01-24 DIAGNOSIS — E1121 Type 2 diabetes mellitus with diabetic nephropathy: Secondary | ICD-10-CM | POA: Diagnosis not present

## 2023-01-24 DIAGNOSIS — N1832 Chronic kidney disease, stage 3b: Secondary | ICD-10-CM | POA: Diagnosis not present

## 2023-01-24 DIAGNOSIS — Z6829 Body mass index (BMI) 29.0-29.9, adult: Secondary | ICD-10-CM | POA: Diagnosis not present

## 2023-02-07 DIAGNOSIS — Z1231 Encounter for screening mammogram for malignant neoplasm of breast: Secondary | ICD-10-CM | POA: Diagnosis not present

## 2023-03-21 DIAGNOSIS — M25512 Pain in left shoulder: Secondary | ICD-10-CM | POA: Diagnosis not present

## 2023-03-21 DIAGNOSIS — Z794 Long term (current) use of insulin: Secondary | ICD-10-CM | POA: Diagnosis not present

## 2023-03-21 DIAGNOSIS — I4821 Permanent atrial fibrillation: Secondary | ICD-10-CM | POA: Diagnosis not present

## 2023-03-21 DIAGNOSIS — I4891 Unspecified atrial fibrillation: Secondary | ICD-10-CM | POA: Diagnosis not present

## 2023-03-21 DIAGNOSIS — R0902 Hypoxemia: Secondary | ICD-10-CM | POA: Diagnosis not present

## 2023-03-21 DIAGNOSIS — Z7901 Long term (current) use of anticoagulants: Secondary | ICD-10-CM | POA: Diagnosis not present

## 2023-03-21 DIAGNOSIS — S0990XA Unspecified injury of head, initial encounter: Secondary | ICD-10-CM | POA: Diagnosis not present

## 2023-03-21 DIAGNOSIS — Z91148 Patient's other noncompliance with medication regimen for other reason: Secondary | ICD-10-CM | POA: Diagnosis not present

## 2023-03-21 DIAGNOSIS — Z853 Personal history of malignant neoplasm of breast: Secondary | ICD-10-CM | POA: Diagnosis not present

## 2023-03-21 DIAGNOSIS — R9431 Abnormal electrocardiogram [ECG] [EKG]: Secondary | ICD-10-CM | POA: Diagnosis not present

## 2023-03-21 DIAGNOSIS — Z043 Encounter for examination and observation following other accident: Secondary | ICD-10-CM | POA: Diagnosis not present

## 2023-03-21 DIAGNOSIS — R739 Hyperglycemia, unspecified: Secondary | ICD-10-CM | POA: Diagnosis not present

## 2023-03-21 DIAGNOSIS — M25511 Pain in right shoulder: Secondary | ICD-10-CM | POA: Diagnosis not present

## 2023-03-21 DIAGNOSIS — E1165 Type 2 diabetes mellitus with hyperglycemia: Secondary | ICD-10-CM | POA: Diagnosis not present

## 2023-03-21 DIAGNOSIS — I2109 ST elevation (STEMI) myocardial infarction involving other coronary artery of anterior wall: Secondary | ICD-10-CM | POA: Diagnosis not present

## 2023-03-21 DIAGNOSIS — W19XXXA Unspecified fall, initial encounter: Secondary | ICD-10-CM | POA: Diagnosis not present

## 2023-03-21 DIAGNOSIS — N390 Urinary tract infection, site not specified: Secondary | ICD-10-CM | POA: Diagnosis not present

## 2023-03-28 DIAGNOSIS — I4891 Unspecified atrial fibrillation: Secondary | ICD-10-CM | POA: Diagnosis not present

## 2023-03-28 DIAGNOSIS — Z7951 Long term (current) use of inhaled steroids: Secondary | ICD-10-CM | POA: Diagnosis not present

## 2023-03-28 DIAGNOSIS — Z9049 Acquired absence of other specified parts of digestive tract: Secondary | ICD-10-CM | POA: Diagnosis not present

## 2023-03-28 DIAGNOSIS — Z8673 Personal history of transient ischemic attack (TIA), and cerebral infarction without residual deficits: Secondary | ICD-10-CM | POA: Diagnosis not present

## 2023-03-28 DIAGNOSIS — Z79899 Other long term (current) drug therapy: Secondary | ICD-10-CM | POA: Diagnosis not present

## 2023-03-28 DIAGNOSIS — I129 Hypertensive chronic kidney disease with stage 1 through stage 4 chronic kidney disease, or unspecified chronic kidney disease: Secondary | ICD-10-CM | POA: Diagnosis not present

## 2023-03-28 DIAGNOSIS — R319 Hematuria, unspecified: Secondary | ICD-10-CM | POA: Diagnosis not present

## 2023-03-28 DIAGNOSIS — K219 Gastro-esophageal reflux disease without esophagitis: Secondary | ICD-10-CM | POA: Diagnosis not present

## 2023-03-28 DIAGNOSIS — E1122 Type 2 diabetes mellitus with diabetic chronic kidney disease: Secondary | ICD-10-CM | POA: Diagnosis not present

## 2023-03-28 DIAGNOSIS — Z7901 Long term (current) use of anticoagulants: Secondary | ICD-10-CM | POA: Diagnosis not present

## 2023-03-28 DIAGNOSIS — Z20822 Contact with and (suspected) exposure to covid-19: Secondary | ICD-10-CM | POA: Diagnosis not present

## 2023-03-28 DIAGNOSIS — Z794 Long term (current) use of insulin: Secondary | ICD-10-CM | POA: Diagnosis not present

## 2023-03-28 DIAGNOSIS — N390 Urinary tract infection, site not specified: Secondary | ICD-10-CM | POA: Diagnosis not present

## 2023-03-28 DIAGNOSIS — F419 Anxiety disorder, unspecified: Secondary | ICD-10-CM | POA: Diagnosis not present

## 2023-03-28 DIAGNOSIS — Z853 Personal history of malignant neoplasm of breast: Secondary | ICD-10-CM | POA: Diagnosis not present

## 2023-03-28 DIAGNOSIS — R509 Fever, unspecified: Secondary | ICD-10-CM | POA: Diagnosis not present

## 2023-03-28 DIAGNOSIS — Z7984 Long term (current) use of oral hypoglycemic drugs: Secondary | ICD-10-CM | POA: Diagnosis not present

## 2023-03-28 DIAGNOSIS — N183 Chronic kidney disease, stage 3 unspecified: Secondary | ICD-10-CM | POA: Diagnosis not present

## 2023-03-28 DIAGNOSIS — Z923 Personal history of irradiation: Secondary | ICD-10-CM | POA: Diagnosis not present

## 2023-03-28 DIAGNOSIS — Z90710 Acquired absence of both cervix and uterus: Secondary | ICD-10-CM | POA: Diagnosis not present

## 2023-03-28 DIAGNOSIS — Z882 Allergy status to sulfonamides status: Secondary | ICD-10-CM | POA: Diagnosis not present

## 2023-04-01 DIAGNOSIS — N390 Urinary tract infection, site not specified: Secondary | ICD-10-CM | POA: Diagnosis not present

## 2023-04-01 DIAGNOSIS — R739 Hyperglycemia, unspecified: Secondary | ICD-10-CM | POA: Diagnosis not present

## 2023-04-01 DIAGNOSIS — S5001XA Contusion of right elbow, initial encounter: Secondary | ICD-10-CM | POA: Diagnosis not present

## 2023-04-01 DIAGNOSIS — M25511 Pain in right shoulder: Secondary | ICD-10-CM | POA: Diagnosis not present

## 2023-04-01 DIAGNOSIS — J90 Pleural effusion, not elsewhere classified: Secondary | ICD-10-CM | POA: Diagnosis not present

## 2023-04-01 DIAGNOSIS — Z9181 History of falling: Secondary | ICD-10-CM | POA: Diagnosis not present

## 2023-04-10 DIAGNOSIS — E119 Type 2 diabetes mellitus without complications: Secondary | ICD-10-CM | POA: Diagnosis not present

## 2023-04-11 DIAGNOSIS — T148XXA Other injury of unspecified body region, initial encounter: Secondary | ICD-10-CM | POA: Diagnosis not present

## 2023-04-11 DIAGNOSIS — L03114 Cellulitis of left upper limb: Secondary | ICD-10-CM | POA: Diagnosis not present

## 2023-04-16 DIAGNOSIS — L02234 Carbuncle of groin: Secondary | ICD-10-CM | POA: Diagnosis not present

## 2023-04-16 DIAGNOSIS — M25522 Pain in left elbow: Secondary | ICD-10-CM | POA: Diagnosis not present

## 2023-04-16 DIAGNOSIS — S5001XA Contusion of right elbow, initial encounter: Secondary | ICD-10-CM | POA: Diagnosis not present

## 2023-04-16 DIAGNOSIS — M545 Low back pain, unspecified: Secondary | ICD-10-CM | POA: Diagnosis not present

## 2023-04-16 DIAGNOSIS — M533 Sacrococcygeal disorders, not elsewhere classified: Secondary | ICD-10-CM | POA: Diagnosis not present

## 2023-04-24 DIAGNOSIS — R55 Syncope and collapse: Secondary | ICD-10-CM | POA: Diagnosis not present

## 2023-04-24 DIAGNOSIS — I4891 Unspecified atrial fibrillation: Secondary | ICD-10-CM | POA: Diagnosis not present

## 2023-04-24 DIAGNOSIS — E1100 Type 2 diabetes mellitus with hyperosmolarity without nonketotic hyperglycemic-hyperosmolar coma (NKHHC): Secondary | ICD-10-CM | POA: Diagnosis not present

## 2023-04-24 DIAGNOSIS — I130 Hypertensive heart and chronic kidney disease with heart failure and stage 1 through stage 4 chronic kidney disease, or unspecified chronic kidney disease: Secondary | ICD-10-CM | POA: Diagnosis not present

## 2023-04-24 DIAGNOSIS — N1832 Chronic kidney disease, stage 3b: Secondary | ICD-10-CM | POA: Diagnosis not present

## 2023-04-24 DIAGNOSIS — R296 Repeated falls: Secondary | ICD-10-CM | POA: Diagnosis not present

## 2023-04-24 DIAGNOSIS — I4821 Permanent atrial fibrillation: Secondary | ICD-10-CM | POA: Diagnosis not present

## 2023-04-24 DIAGNOSIS — E1122 Type 2 diabetes mellitus with diabetic chronic kidney disease: Secondary | ICD-10-CM | POA: Diagnosis not present

## 2023-04-24 DIAGNOSIS — R7309 Other abnormal glucose: Secondary | ICD-10-CM | POA: Diagnosis not present

## 2023-04-24 DIAGNOSIS — R6889 Other general symptoms and signs: Secondary | ICD-10-CM | POA: Diagnosis not present

## 2023-04-24 DIAGNOSIS — D649 Anemia, unspecified: Secondary | ICD-10-CM | POA: Diagnosis not present

## 2023-04-24 DIAGNOSIS — M25562 Pain in left knee: Secondary | ICD-10-CM | POA: Diagnosis not present

## 2023-04-24 DIAGNOSIS — E8809 Other disorders of plasma-protein metabolism, not elsewhere classified: Secondary | ICD-10-CM | POA: Diagnosis not present

## 2023-04-24 DIAGNOSIS — F5104 Psychophysiologic insomnia: Secondary | ICD-10-CM | POA: Diagnosis not present

## 2023-04-24 DIAGNOSIS — D709 Neutropenia, unspecified: Secondary | ICD-10-CM | POA: Diagnosis not present

## 2023-04-24 DIAGNOSIS — N390 Urinary tract infection, site not specified: Secondary | ICD-10-CM | POA: Diagnosis not present

## 2023-04-24 DIAGNOSIS — D801 Nonfamilial hypogammaglobulinemia: Secondary | ICD-10-CM | POA: Diagnosis not present

## 2023-04-24 DIAGNOSIS — D469 Myelodysplastic syndrome, unspecified: Secondary | ICD-10-CM | POA: Diagnosis not present

## 2023-04-24 DIAGNOSIS — I5032 Chronic diastolic (congestive) heart failure: Secondary | ICD-10-CM | POA: Diagnosis not present

## 2023-04-24 DIAGNOSIS — E11649 Type 2 diabetes mellitus with hypoglycemia without coma: Secondary | ICD-10-CM | POA: Diagnosis not present

## 2023-04-24 DIAGNOSIS — G4733 Obstructive sleep apnea (adult) (pediatric): Secondary | ICD-10-CM | POA: Diagnosis not present

## 2023-04-24 DIAGNOSIS — F419 Anxiety disorder, unspecified: Secondary | ICD-10-CM | POA: Diagnosis not present

## 2023-04-24 DIAGNOSIS — F32A Depression, unspecified: Secondary | ICD-10-CM | POA: Diagnosis not present

## 2023-04-24 DIAGNOSIS — B952 Enterococcus as the cause of diseases classified elsewhere: Secondary | ICD-10-CM | POA: Diagnosis not present

## 2023-04-24 DIAGNOSIS — E1110 Type 2 diabetes mellitus with ketoacidosis without coma: Secondary | ICD-10-CM | POA: Diagnosis not present

## 2023-04-24 DIAGNOSIS — D696 Thrombocytopenia, unspecified: Secondary | ICD-10-CM | POA: Diagnosis not present

## 2023-04-24 DIAGNOSIS — G2581 Restless legs syndrome: Secondary | ICD-10-CM | POA: Diagnosis not present

## 2023-04-24 DIAGNOSIS — E1165 Type 2 diabetes mellitus with hyperglycemia: Secondary | ICD-10-CM | POA: Diagnosis not present

## 2023-04-24 DIAGNOSIS — R627 Adult failure to thrive: Secondary | ICD-10-CM | POA: Diagnosis not present

## 2023-04-24 DIAGNOSIS — C946 Myelodysplastic disease, not classified: Secondary | ICD-10-CM | POA: Diagnosis not present

## 2023-04-24 DIAGNOSIS — N3000 Acute cystitis without hematuria: Secondary | ICD-10-CM | POA: Diagnosis not present

## 2023-04-24 DIAGNOSIS — E869 Volume depletion, unspecified: Secondary | ICD-10-CM | POA: Diagnosis not present

## 2023-04-24 DIAGNOSIS — R634 Abnormal weight loss: Secondary | ICD-10-CM | POA: Diagnosis not present

## 2023-04-24 DIAGNOSIS — G9341 Metabolic encephalopathy: Secondary | ICD-10-CM | POA: Diagnosis not present

## 2023-04-24 DIAGNOSIS — Z794 Long term (current) use of insulin: Secondary | ICD-10-CM | POA: Diagnosis not present

## 2023-04-24 DIAGNOSIS — R4182 Altered mental status, unspecified: Secondary | ICD-10-CM | POA: Diagnosis not present

## 2023-04-24 DIAGNOSIS — K219 Gastro-esophageal reflux disease without esophagitis: Secondary | ICD-10-CM | POA: Diagnosis not present

## 2023-04-24 DIAGNOSIS — E872 Acidosis, unspecified: Secondary | ICD-10-CM | POA: Diagnosis not present

## 2023-04-24 DIAGNOSIS — D61818 Other pancytopenia: Secondary | ICD-10-CM | POA: Diagnosis not present

## 2023-04-24 DIAGNOSIS — N179 Acute kidney failure, unspecified: Secondary | ICD-10-CM | POA: Diagnosis not present

## 2023-04-24 DIAGNOSIS — M25561 Pain in right knee: Secondary | ICD-10-CM | POA: Diagnosis not present

## 2023-04-25 DIAGNOSIS — R7309 Other abnormal glucose: Secondary | ICD-10-CM | POA: Diagnosis not present

## 2023-04-25 DIAGNOSIS — E1165 Type 2 diabetes mellitus with hyperglycemia: Secondary | ICD-10-CM | POA: Diagnosis not present

## 2023-04-25 DIAGNOSIS — Z794 Long term (current) use of insulin: Secondary | ICD-10-CM | POA: Diagnosis not present

## 2023-04-25 DIAGNOSIS — R4182 Altered mental status, unspecified: Secondary | ICD-10-CM | POA: Diagnosis not present

## 2023-04-25 DIAGNOSIS — I4821 Permanent atrial fibrillation: Secondary | ICD-10-CM | POA: Diagnosis not present

## 2023-04-25 DIAGNOSIS — N179 Acute kidney failure, unspecified: Secondary | ICD-10-CM | POA: Diagnosis not present

## 2023-04-25 DIAGNOSIS — E1100 Type 2 diabetes mellitus with hyperosmolarity without nonketotic hyperglycemic-hyperosmolar coma (NKHHC): Secondary | ICD-10-CM | POA: Diagnosis not present

## 2023-04-26 DIAGNOSIS — D649 Anemia, unspecified: Secondary | ICD-10-CM | POA: Diagnosis not present

## 2023-04-26 DIAGNOSIS — E1165 Type 2 diabetes mellitus with hyperglycemia: Secondary | ICD-10-CM | POA: Diagnosis not present

## 2023-04-26 DIAGNOSIS — I4821 Permanent atrial fibrillation: Secondary | ICD-10-CM | POA: Diagnosis not present

## 2023-04-26 DIAGNOSIS — N390 Urinary tract infection, site not specified: Secondary | ICD-10-CM | POA: Diagnosis not present

## 2023-04-26 DIAGNOSIS — D696 Thrombocytopenia, unspecified: Secondary | ICD-10-CM | POA: Diagnosis not present

## 2023-04-26 DIAGNOSIS — D709 Neutropenia, unspecified: Secondary | ICD-10-CM | POA: Diagnosis not present

## 2023-04-26 DIAGNOSIS — R4182 Altered mental status, unspecified: Secondary | ICD-10-CM | POA: Diagnosis not present

## 2023-04-26 DIAGNOSIS — R7309 Other abnormal glucose: Secondary | ICD-10-CM | POA: Diagnosis not present

## 2023-04-26 DIAGNOSIS — Z794 Long term (current) use of insulin: Secondary | ICD-10-CM | POA: Diagnosis not present

## 2023-04-26 DIAGNOSIS — E1100 Type 2 diabetes mellitus with hyperosmolarity without nonketotic hyperglycemic-hyperosmolar coma (NKHHC): Secondary | ICD-10-CM | POA: Diagnosis not present

## 2023-04-26 DIAGNOSIS — N179 Acute kidney failure, unspecified: Secondary | ICD-10-CM | POA: Diagnosis not present

## 2023-04-27 DIAGNOSIS — E1100 Type 2 diabetes mellitus with hyperosmolarity without nonketotic hyperglycemic-hyperosmolar coma (NKHHC): Secondary | ICD-10-CM | POA: Diagnosis not present

## 2023-04-28 DIAGNOSIS — E1100 Type 2 diabetes mellitus with hyperosmolarity without nonketotic hyperglycemic-hyperosmolar coma (NKHHC): Secondary | ICD-10-CM | POA: Diagnosis not present

## 2023-04-29 DIAGNOSIS — E1100 Type 2 diabetes mellitus with hyperosmolarity without nonketotic hyperglycemic-hyperosmolar coma (NKHHC): Secondary | ICD-10-CM | POA: Diagnosis not present

## 2023-04-29 DIAGNOSIS — D709 Neutropenia, unspecified: Secondary | ICD-10-CM | POA: Diagnosis not present

## 2023-04-29 DIAGNOSIS — D649 Anemia, unspecified: Secondary | ICD-10-CM | POA: Diagnosis not present

## 2023-04-29 DIAGNOSIS — D696 Thrombocytopenia, unspecified: Secondary | ICD-10-CM | POA: Diagnosis not present

## 2023-04-30 DIAGNOSIS — E1100 Type 2 diabetes mellitus with hyperosmolarity without nonketotic hyperglycemic-hyperosmolar coma (NKHHC): Secondary | ICD-10-CM | POA: Diagnosis not present

## 2023-04-30 DIAGNOSIS — N179 Acute kidney failure, unspecified: Secondary | ICD-10-CM | POA: Diagnosis not present

## 2023-04-30 DIAGNOSIS — D61818 Other pancytopenia: Secondary | ICD-10-CM | POA: Diagnosis not present

## 2023-04-30 DIAGNOSIS — I4821 Permanent atrial fibrillation: Secondary | ICD-10-CM | POA: Diagnosis not present

## 2023-05-01 DIAGNOSIS — R6889 Other general symptoms and signs: Secondary | ICD-10-CM | POA: Diagnosis not present

## 2023-05-01 DIAGNOSIS — D61818 Other pancytopenia: Secondary | ICD-10-CM | POA: Diagnosis not present

## 2023-05-01 DIAGNOSIS — N179 Acute kidney failure, unspecified: Secondary | ICD-10-CM | POA: Diagnosis not present

## 2023-05-01 DIAGNOSIS — D709 Neutropenia, unspecified: Secondary | ICD-10-CM | POA: Diagnosis not present

## 2023-05-01 DIAGNOSIS — E8809 Other disorders of plasma-protein metabolism, not elsewhere classified: Secondary | ICD-10-CM | POA: Diagnosis not present

## 2023-05-01 DIAGNOSIS — I4821 Permanent atrial fibrillation: Secondary | ICD-10-CM | POA: Diagnosis not present

## 2023-05-01 DIAGNOSIS — C946 Myelodysplastic disease, not classified: Secondary | ICD-10-CM | POA: Diagnosis not present

## 2023-05-01 DIAGNOSIS — E1100 Type 2 diabetes mellitus with hyperosmolarity without nonketotic hyperglycemic-hyperosmolar coma (NKHHC): Secondary | ICD-10-CM | POA: Diagnosis not present

## 2023-05-01 DIAGNOSIS — D801 Nonfamilial hypogammaglobulinemia: Secondary | ICD-10-CM | POA: Diagnosis not present

## 2023-05-02 DIAGNOSIS — D61818 Other pancytopenia: Secondary | ICD-10-CM | POA: Diagnosis not present

## 2023-05-03 DIAGNOSIS — D61818 Other pancytopenia: Secondary | ICD-10-CM | POA: Diagnosis not present

## 2023-05-04 DIAGNOSIS — D61818 Other pancytopenia: Secondary | ICD-10-CM | POA: Diagnosis not present

## 2023-05-05 DIAGNOSIS — D61818 Other pancytopenia: Secondary | ICD-10-CM | POA: Diagnosis not present

## 2023-05-06 DIAGNOSIS — D61818 Other pancytopenia: Secondary | ICD-10-CM | POA: Diagnosis not present

## 2023-05-07 DIAGNOSIS — D61818 Other pancytopenia: Secondary | ICD-10-CM | POA: Diagnosis not present

## 2023-05-07 DIAGNOSIS — D469 Myelodysplastic syndrome, unspecified: Secondary | ICD-10-CM | POA: Diagnosis not present

## 2023-05-08 DIAGNOSIS — D61818 Other pancytopenia: Secondary | ICD-10-CM | POA: Diagnosis not present

## 2023-05-09 DIAGNOSIS — D61818 Other pancytopenia: Secondary | ICD-10-CM | POA: Diagnosis not present

## 2023-05-10 DIAGNOSIS — D61818 Other pancytopenia: Secondary | ICD-10-CM | POA: Diagnosis not present

## 2023-06-10 DEATH — deceased
